# Patient Record
Sex: Female | Born: 1965 | Race: Black or African American | Hispanic: No | Marital: Single | Smoking: Current every day smoker
Health system: Southern US, Community
[De-identification: ages and names within clinical notes are randomized; demographics above are authoritative.]

## PROBLEM LIST (undated history)

## (undated) DIAGNOSIS — C50919 Malignant neoplasm of unspecified site of unspecified female breast: Secondary | ICD-10-CM

## (undated) HISTORY — DX: Malignant neoplasm of unspecified site of unspecified female breast: C50.919

## (undated) HISTORY — PX: ABDOMINAL HYSTERECTOMY: SHX81

---

## 2010-10-29 ENCOUNTER — Emergency Department (HOSPITAL_COMMUNITY): Payer: Self-pay

## 2010-10-29 ENCOUNTER — Emergency Department (HOSPITAL_COMMUNITY)
Admission: EM | Admit: 2010-10-29 | Discharge: 2010-10-30 | Disposition: A | Discharge status: Home / Self Care | Payer: Self-pay | Attending: Emergency Medicine | Admitting: Emergency Medicine

## 2010-10-29 ENCOUNTER — Inpatient Hospital Stay (INDEPENDENT_AMBULATORY_CARE_PROVIDER_SITE_OTHER)
Admission: RE | Admit: 2010-10-29 | Discharge: 2010-10-29 | Disposition: A | Discharge status: Home / Self Care | Payer: Self-pay | Source: Ambulatory Visit | Attending: Emergency Medicine | Admitting: Emergency Medicine

## 2010-10-29 DIAGNOSIS — R220 Localized swelling, mass and lump, head: Secondary | ICD-10-CM | POA: Insufficient documentation

## 2010-10-29 DIAGNOSIS — R259 Unspecified abnormal involuntary movements: Secondary | ICD-10-CM | POA: Insufficient documentation

## 2010-10-29 DIAGNOSIS — K047 Periapical abscess without sinus: Secondary | ICD-10-CM | POA: Insufficient documentation

## 2010-10-29 DIAGNOSIS — K089 Disorder of teeth and supporting structures, unspecified: Secondary | ICD-10-CM | POA: Insufficient documentation

## 2010-10-29 DIAGNOSIS — H9209 Otalgia, unspecified ear: Secondary | ICD-10-CM | POA: Insufficient documentation

## 2010-10-29 DIAGNOSIS — R221 Localized swelling, mass and lump, neck: Secondary | ICD-10-CM | POA: Insufficient documentation

## 2010-10-29 DIAGNOSIS — M542 Cervicalgia: Secondary | ICD-10-CM | POA: Insufficient documentation

## 2010-10-29 LAB — DIFFERENTIAL
Eosinophils Relative: 0 % (ref 0–5)
Lymphocytes Relative: 22 % (ref 12–46)
Lymphs Abs: 1.6 10*3/uL (ref 0.7–4.0)
Monocytes Absolute: 0.5 10*3/uL (ref 0.1–1.0)

## 2010-10-29 LAB — BASIC METABOLIC PANEL
BUN: 8 mg/dL (ref 6–23)
Calcium: 9.1 mg/dL (ref 8.4–10.5)
Creatinine, Ser: 0.64 mg/dL (ref 0.50–1.10)
GFR calc Af Amer: >90 mL/min (ref >90)
GFR calc non Af Amer: >90 mL/min (ref >90)

## 2010-10-29 LAB — CBC
HCT: 37.4 % (ref 36.0–46.0)
MCH: 35.8 pg — ABNORMAL HIGH (ref 26.0–34.0)
MCHC: 35.6 g/dL (ref 30.0–36.0)
MCV: 100.5 fL — ABNORMAL HIGH (ref 78.0–100.0)
RDW: 11.8 % (ref 11.5–15.5)

## 2010-10-29 MED ORDER — IOHEXOL 300 MG/ML  SOLN
50.0000 mL | Freq: Once | INTRAMUSCULAR | Status: AC | PRN
  Administered 2010-10-29: 50 mL via INTRAVENOUS

## 2012-10-24 ENCOUNTER — Emergency Department (HOSPITAL_COMMUNITY)
Admission: EM | Admit: 2012-10-24 | Discharge: 2012-10-24 | Disposition: A | Discharge status: Home / Self Care | Payer: Self-pay | Attending: Emergency Medicine | Admitting: Emergency Medicine

## 2012-10-24 ENCOUNTER — Encounter (HOSPITAL_COMMUNITY): Payer: Self-pay | Admitting: *Deleted

## 2012-10-24 DIAGNOSIS — F17200 Nicotine dependence, unspecified, uncomplicated: Secondary | ICD-10-CM | POA: Insufficient documentation

## 2012-10-24 DIAGNOSIS — K529 Noninfective gastroenteritis and colitis, unspecified: Secondary | ICD-10-CM

## 2012-10-24 DIAGNOSIS — Z7982 Long term (current) use of aspirin: Secondary | ICD-10-CM | POA: Insufficient documentation

## 2012-10-24 DIAGNOSIS — R5381 Other malaise: Secondary | ICD-10-CM | POA: Insufficient documentation

## 2012-10-24 DIAGNOSIS — R112 Nausea with vomiting, unspecified: Secondary | ICD-10-CM

## 2012-10-24 DIAGNOSIS — K5289 Other specified noninfective gastroenteritis and colitis: Secondary | ICD-10-CM | POA: Insufficient documentation

## 2012-10-24 DIAGNOSIS — Z3202 Encounter for pregnancy test, result negative: Secondary | ICD-10-CM | POA: Insufficient documentation

## 2012-10-24 LAB — URINALYSIS, ROUTINE W REFLEX MICROSCOPIC
Bilirubin Urine: NEGATIVE
Ketones, ur: NEGATIVE mg/dL
Nitrite: NEGATIVE
Urobilinogen, UA: 1 mg/dL (ref 0.0–1.0)
pH: 6.5 (ref 5.0–8.0)

## 2012-10-24 LAB — CBC WITH DIFFERENTIAL/PLATELET
Basophils Absolute: 0 10*3/uL (ref 0.0–0.1)
HCT: 38.5 % (ref 36.0–46.0)
Lymphocytes Relative: 19 % (ref 12–46)
Neutro Abs: 5.4 10*3/uL (ref 1.7–7.7)
Neutrophils Relative %: 74 % (ref 43–77)
Platelets: 347 10*3/uL (ref 150–400)
RDW: 12.9 % (ref 11.5–15.5)
WBC: 7.3 10*3/uL (ref 4.0–10.5)

## 2012-10-24 LAB — COMPREHENSIVE METABOLIC PANEL
Alkaline Phosphatase: 64 U/L (ref 39–117)
BUN: 12 mg/dL (ref 6–23)
GFR calc Af Amer: >90 mL/min (ref >90)
Glucose, Bld: 95 mg/dL (ref 70–99)
Potassium: 4.6 mEq/L (ref 3.5–5.1)
Total Protein: 7.1 g/dL (ref 6.0–8.3)

## 2012-10-24 LAB — POCT PREGNANCY, URINE: Preg Test, Ur: NEGATIVE

## 2012-10-24 LAB — URINE MICROSCOPIC-ADD ON

## 2012-10-24 MED ORDER — SODIUM CHLORIDE 0.9 % IV BOLUS (SEPSIS)
1000.0000 mL | Freq: Once | INTRAVENOUS | Status: AC
  Administered 2012-10-24: 1000 mL via INTRAVENOUS

## 2012-10-24 MED ORDER — ONDANSETRON 4 MG PO TBDP: 4.0000 mg | ORAL_TABLET | Freq: Three times a day (TID) | ORAL | Status: DC | PRN

## 2012-10-24 MED ORDER — ONDANSETRON HCL 4 MG/2ML IJ SOLN
4.0000 mg | Freq: Once | INTRAMUSCULAR | Status: AC
  Administered 2012-10-24: 4 mg via INTRAVENOUS
  Filled 2012-10-24: qty 2

## 2012-10-24 NOTE — ED Notes (Signed)
Pt reports n/v/d x 4 days and fever/chills. No acute distress noted at this time.

## 2012-10-24 NOTE — ED Notes (Signed)
Pt aware we need urine specimen. Specimen cup provided 

## 2012-10-24 NOTE — Discharge Instructions (Signed)
Viral Gastroenteritis °Viral gastroenteritis is also known as stomach flu. This condition affects the stomach and intestinal tract. It can cause sudden diarrhea and vomiting. The illness typically lasts 3 to 8 days. Most people develop an immune response that eventually gets rid of the virus. While this natural response develops, the virus can make you quite ill. °CAUSES  °Many different viruses can cause gastroenteritis, such as rotavirus or noroviruses. You can catch one of these viruses by consuming contaminated food or water. You may also catch a virus by sharing utensils or other personal items with an infected person or by touching a contaminated surface. °SYMPTOMS  °The most common symptoms are diarrhea and vomiting. These problems can cause a severe loss of body fluids (dehydration) and a body salt (electrolyte) imbalance. Other symptoms may include: °· Fever. °· Headache. °· Fatigue. °· Abdominal pain. °DIAGNOSIS  °Your caregiver can usually diagnose viral gastroenteritis based on your symptoms and a physical exam. A stool sample may also be taken to test for the presence of viruses or other infections. °TREATMENT  °This illness typically goes away on its own. Treatments are aimed at rehydration. The most serious cases of viral gastroenteritis involve vomiting so severely that you are not able to keep fluids down. In these cases, fluids must be given through an intravenous line (IV). °HOME CARE INSTRUCTIONS  °· Drink enough fluids to keep your urine clear or pale yellow. Drink small amounts of fluids frequently and increase the amounts as tolerated. °· Ask your caregiver for specific rehydration instructions. °· Avoid: °· Foods high in sugar. °· Alcohol. °· Carbonated drinks. °· Tobacco. °· Juice. °· Caffeine drinks. °· Extremely hot or cold fluids. °· Fatty, greasy foods. °· Too much intake of anything at one time. °· Dairy products until 24 to 48 hours after diarrhea stops. °· You may consume probiotics.  Probiotics are active cultures of beneficial bacteria. They may lessen the amount and number of diarrheal stools in adults. Probiotics can be found in yogurt with active cultures and in supplements. °· Wash your hands well to avoid spreading the virus. °· Only take over-the-counter or prescription medicines for pain, discomfort, or fever as directed by your caregiver. Do not give aspirin to children. Antidiarrheal medicines are not recommended. °· Ask your caregiver if you should continue to take your regular prescribed and over-the-counter medicines. °· Keep all follow-up appointments as directed by your caregiver. °SEEK IMMEDIATE MEDICAL CARE IF:  °· You are unable to keep fluids down. °· You do not urinate at least once every 6 to 8 hours. °· You develop shortness of breath. °· You notice blood in your stool or vomit. This may look like coffee grounds. °· You have abdominal pain that increases or is concentrated in one small area (localized). °· You have persistent vomiting or diarrhea. °· You have a fever. °· The patient is a child younger than 3 months, and he or she has a fever. °· The patient is a child older than 3 months, and he or she has a fever and persistent symptoms. °· The patient is a child older than 3 months, and he or she has a fever and symptoms suddenly get worse. °· The patient is a baby, and he or she has no tears when crying. °MAKE SURE YOU:  °· Understand these instructions. °· Will watch your condition. °· Will get help right away if you are not doing well or get worse. °Document Released: 01/06/2005 Document Revised: 03/31/2011 Document Reviewed: 10/23/2010 °  ExitCare Patient Information 2014 [place], [place].  RESOURCE GUIDE  Chronic Pain Problems: Contact [facility]  [phone] Patients need to be referred by their primary care doctor.  Insufficient Money for Medicine: Contact United Way:  call [phone]  No Primary Care Doctor: - Call Health Connect   [phone] - can help you locate a primary care doctor that  accepts your insurance, provides certain services, etc. - Physician Referral Service- [phone]  Agencies that provide inexpensive medical care: - [name]  [phone] - [name]  [phone] - [facility]  [phone] - Women's Clinic  [phone] - Planned Parenthood  [phone] [facility]  [phone]  Medicaid-accepting [facility] Providers: - [facility]- [address]  [phone], Mon-Fri 9am-7pm, Sat 9am-1pm - [facility]- [address] [place], Suite [place]  [phone] - [facility]- [address] [place]  [phone] [facility]- [address]  [phone] - Renaye Rakers- [address] [place], [address], [phone]  Only accepts [place] patients after they have their name  applied to their card  Self Pay (no insurance) in [place]: - Sickle Cell Patients - [facility]  [address] [place], [phone] - [facility]- [address] [place]  [phone]       [name]- [address]       -     [facility]- see information above (Speak to [employer] if you do not have insurance)       -  [facility]- [address],  [phone]       -  [facility]- [address], [phone]       -  Dr Julio Sicks-  [address], [place], [phone]       -  [facility] - [address] [phone]       -  [facility]- [address], [phone], also [address], [phone]       -     [facility]- [address] [place], [phone], 1st & 3rd Saturday         every month, 10am-1pm  -     [facility]   [address] [place], [place].   Phone:  [phone], Fax:  [phone]. Hours  of Operation:  9 am - 6 pm, M-F.  -     [facility]   [address], [place]   Phone: [phone], Fax: [phone]. Hours of Operation:  8:30 am - 5:30 pm, M-F.  [facility] [address] [place], [place] [address] [phone]  [facility] [address] [place], [place] [address] [phone]  1) Find a Doctor and Pay Out of Pocket Although you won't have to find out who is covered by your insurance plan, it is a good idea to ask around and get recommendations. You will then need to call the office and see if the doctor you have chosen will accept you as a new patient and what types of options they offer for patients who are self-pay. Some doctors offer discounts or will set up payment plans for  their patients who do not have insurance, but you will need to ask so you aren't surprised when you get to your appointment.  2) Contact Your Local Health Department Not all health departments have doctors that can see patients for sick visits, but many do, so it is worth a call to see if yours does. If you don't know where your local health department is, you can check in your phone book. The CDC also has a tool to help you locate your state's health department, and many state websites also have listings of all of their local health departments.  3) Find a Walk-in Clinic If your illness is not likely to be very severe or complicated, you may want to try a walk in clinic. These are popping up all over the country in pharmacies, drugstores, and shopping centers. They're usually staffed by nurse practitioners or physician assistants that have been trained to treat common illnesses and complaints. They're usually fairly quick and inexpensive. However, if you have serious medical issues or chronic medical problems, these are probably not your best option  STD Testing - [facility] [place], STD  Clinic, [address] [place], phone [phone] or [phone].  Monday - Friday, call for an appointment. [facility] [employer], STD Clinic, [place] [address], [place], phone [phone] or [phone].  Monday - Friday, call for an appointment.  Abuse/Neglect: [facility] Child Abuse Hotline (615)[phone] [facility] Child Abuse Hotline 860-131-4720 (After Hours)  Emergency Shelter:  [facility] [phone]  Maternity Homes: - Room at the [place] [facility] [phone] - [facility] [phone]  MRSA Hotline #:   539-629-2669  Dental Assistance If unable to pay or uninsured, contact:  [facility]. to become qualified for the adult dental clinic.  Patients with Medicaid: [facility] [phone] [address] Qu[address], [phone] [address], [phone]  If unable to pay, or uninsured, contact [facility] [phone] in [place], [phone] in [facility]) to become qualified for the adult dental clinic  [facility] [address] [place], [place] [address] [phone] [email or web address]  Other Proofreader Services: - [facility]- [address] [place], [place], [place], [address], [phone], Ext. [phone], 2nd and 4th Thursday of the month at 6:30am.  10 clients each day by appointment, can sometimes see walk-in patients if someone does not show for an appointment. [facility]- [address] [place], [place], [address], [phone] - [facility] [address] [place], [place], [address], [phone] - [facility]- [phone] [facility] Health Department- [phone] [facility] Health [phone]       Behavioral Health Resources in the [facility]  Intensive Outpatient Programs: [facility] [place] [facility]      [address] [place], [place] [phone] Both a day and evening program       [facility] Out[facility]     [address]        [place], [place] [address] [phone]         ADS: [facility] [address] [place] [phone]  [facility] Mental Health ACCESS LINE: [phone] or [phone] [address] [place], [place] [address] [email or web address]   Substance Abuse Resources: - [facility] Drug [facility]  ([phone] - [facility] [phone] -  [facility] [place] [phone] Floydene Flock [phone] - Residential & Outpatient Substance Abuse Program  [phone]  Psychological Services: [facility]  [phone] Services  [phone] - [facility], [phone] [place]. [address] [place], ACCESS LINE: [phone] or [phone], [email or web address]  Mobile Crisis Teams:                                        Therapeutic Alternatives         Mobile Crisis Care Unit [phone]             [facility] [address] Ginette Otto [phone]                                         Interventionist [address] [place] [address] [place] [phone]  Self-Help/Support Groups: Mental Health Assoc[facility] of [employer] of support groups [phone] (call for more info)  Narcotics Anonymous (NA) Caring Services [address] [place] - 2 meetings at this location  Residential Treatment Programs:  [facility]      [address]        [place]       [phone]         [facility] [address] [place] [address] [place], [place]  [address] [phone]  [facility]  [address] [place], [place] [address] [phone] Admissions: 8am-3pm M-F  [facility]     [address]          [place], [place] [address]       [phone]         [facility] [address] [place], [place] [phone]  [facility] [address] [place], [place] [phone]  [facility] - Intensive Outpatient      [address]     [place], [place]       [phone]         [facility] ([facility]     [address] [place], [place] [phone] or [phone]  Residential Treatment Services (RTS), Medicaid [address] [place] [address] [place], [place] [phone]  [facility] [address]                                               [address] [place] [phone]  [facility] Resources: [phone]               General Therapy                                                Angie Fava, PhD        [address] [place], [place] [address]  [phone]   Insurance  [facility]al   [address] [place], [place] [address] [phone]  [facility] [address] [employer], [place] [address] [phone] Insurance/Medicaid/sponsorship through [facility] and Families                                              [address]                                        [place], [place] [address]    Therapy/tele-psych/case         [phone]          [facility] [place] [facility] [address] [place], [place]  [address]  Adolescent/group home/case management [phone]                                           Creola Corn PhD       General therapy       Insurance   [phone]         Dr. Lolly Mustache, Insurance, M-F [phone]  [facility]  [employer] [facility] Dept. [address]                 [address]         [address] [place] [address]  Blondell Reveal Phone:  [phone]                                   Phone:  [phone]                   Phone:  [phone]  [facility], [phone] - [facility]- [phone]       -     [facility] in [place], [address],             [phone], Insurance  [place] Child Abuse Hotline 646-187-8734 or 939-509-3117 (After Hours)

## 2012-10-24 NOTE — ED Provider Notes (Signed)
CSN: [id number]     Arrival date & time 10/24/12  1453 History   First MD Initiated Contact with Patient 10/24/12 1609     Chief Complaint  Patient presents with  . Emesis  . Diarrhea   (Consider location/radiation/quality/duration/timing/severity/associated sxs/prior Treatment) Patient is a 47 y.o. female presenting with vomiting.  Emesis Duration:  4 days Timing:  Intermittent Number of daily episodes:  3 Quality:  Stomach contents Progression:  Unchanged Chronicity:  New Recent urination:  Decreased Relieved by:  Nothing Worsened by:  Nothing tried Ineffective treatments:  None tried Associated symptoms: diarrhea   Associated symptoms: no abdominal pain, no chills, no cough, no fever, no headaches and no myalgias   Risk factors: no alcohol use, no prior abdominal surgery and no sick contacts     History reviewed. No pertinent past medical history. Past Surgical History  Procedure Laterality Date  . Abdominal hysterectomy     History reviewed. No pertinent family history. History  Substance Use Topics  . Smoking status: Current Every Day Smoker    Types: Cigarettes  . Smokeless tobacco: Not on file  . Alcohol Use: Yes     Comment: occ beer   OB History   Grav Para Term Preterm Abortions TAB SAB Ect Mult Living                 Review of Systems  Constitutional: Negative for chills.  HENT: Negative for congestion and rhinorrhea.   Respiratory: Negative for cough, chest tightness and shortness of breath.   Cardiovascular: Negative for chest pain.  Gastrointestinal: Positive for nausea, vomiting and diarrhea. Negative for abdominal pain.  Genitourinary: Negative for dysuria and difficulty urinating.  Musculoskeletal: Negative for myalgias.  Skin: Negative for color change, pallor, rash and wound.  Neurological: Positive for weakness. Negative for dizziness, seizures, speech difficulty and headaches.  All other systems reviewed and are negative.    Allergies   Review of patient's allergies indicates no known allergies.  Home Medications   Current Outpatient Rx  Name  Route  Sig  Dispense  Refill  . Aspirin-Salicylamide-Caffeine (BC HEADACHE POWDER PO)   Oral   Take 1 Package by mouth daily as needed. For pain         . ondansetron (ZOFRAN ODT) 4 MG disintegrating tablet   Oral   Take 1 tablet (4 mg total) by mouth every 8 (eight) hours as needed for nausea.   10 tablet   0    BP 142/78  Pulse 86  Temp(Src) 98.3 F (36.8 C) (Oral)  Resp 17  Ht 5\' 2"  (1.575 m)  Wt 93 lb 4.8 oz (42.321 kg)  BMI 17.06 kg/m2  SpO2 100% Physical Exam  Nursing note and vitals reviewed. Constitutional: She is oriented to person, place, and time. She appears well-developed and well-nourished. No distress.  Pleasant female, appears older than her stated age, in NAD  HENT:  Head: Normocephalic and atraumatic.  Mouth/Throat: Oropharynx is clear and moist. No oropharyngeal exudate.  Dry mucous membranes  Eyes: Conjunctivae and EOM are normal. Pupils are equal, round, and reactive to light.  Neck: Normal range of motion. Neck supple.  Cardiovascular: Normal rate, regular rhythm and normal heart sounds.  Exam reveals no gallop and no friction rub.   No murmur heard. Pulmonary/Chest: Effort normal and breath sounds normal. No respiratory distress. She has no wheezes. She has no rales. She exhibits no tenderness.  Abdominal: Soft. She exhibits no distension. There is no tenderness.  Musculoskeletal:  Normal range of motion. She exhibits no edema and no tenderness.  Lymphadenopathy:    She has no cervical adenopathy.  Neurological: She is alert and oriented to person, place, and time.  Skin: Skin is warm and dry. No rash noted. She is not diaphoretic.  3 second cap refill  Psychiatric: She has a normal mood and affect. Her behavior is normal. Judgment and thought content normal.    ED Course  Procedures (including critical care time) Labs Review Labs  Reviewed  URINALYSIS, ROUTINE W REFLEX MICROSCOPIC - Abnormal; Notable for the following:    APPearance CLOUDY (*)    Hgb urine dipstick TRACE (*)    All other components within normal limits  COMPREHENSIVE METABOLIC PANEL - Abnormal; Notable for the following:    Albumin 3.4 (*)    GFR calc non Af Amer 82 (*)    All other components within normal limits  CBC WITH DIFFERENTIAL - Abnormal; Notable for the following:    RBC 3.78 (*)    MCV 101.9 (*)    MCH 36.8 (*)    MCHC 36.1 (*)    All other components within normal limits  URINE MICROSCOPIC-ADD ON - Abnormal; Notable for the following:    Squamous Epithelial / LPF FEW (*)    Bacteria, UA FEW (*)    All other components within normal limits  LIPASE, BLOOD  POCT PREGNANCY, URINE   Imaging Review No results found.  MDM   1. Gastroenteritis   2. Nausea and vomiting     Patient is a 47 year old female with no significant past medical history who presents with 4 days of nausea, vomiting, diarrhea. She describes watery bowel movements, twice a day, as well as intermittent vomiting, with more dry heaving, worse in the mornings. She states that she has had decreased intake over that time and just generally feels and dehydrated. She denies, chest pain, shortness of breath, hematemesis, blood in her stool, dysuria.  On arrival, the patient is afebrile and hemodynamically stable. She does look mildly dehydrated with dry mucous membranes and 3 seconds capillary refill. She is otherwise well-appearing with no abdominal pain and no abdominal tenderness. Doubt appendicitis, obstruction, nephrolithiasis, gastritis based on history and exam. More likely is viral illness with mild dehydration. Will rehydrate and evaluate with abdominal labs, urinalysis.  Following hydration, patient feels significantly improved. No pain. No nausea or vomiting during her emergency department stay. Labs show no leukocytosis, no anemia, no elevated lipase. Normal  urine without signs of urinary tract infection. Urine pregnancy negative. Presentation is consistent with gastroenteritis. Will treat with anti-motives and Zofran when necessary. Abdominal return precautions discussed with the patient. Patient stable for discharge her PCP followup.  Discussed with the patient return precautions and need for follow up with PCP. Patient voiced understanding. Stable for d/c. This patient was discussed with my attending, Dr. Redgie Grayer.  Dorna Leitz, MD 10/24/12 Rickey Primus

## 2012-10-26 NOTE — ED Provider Notes (Signed)
I saw and evaluated the patient, reviewed the resident's note and I agree with the findings and plan.  Benign abd, labs neg, sxs improved in ER.  D/c with imodium and zofran.  ER precautions given.  Darlys Gales, MD 10/26/12 2024

## 2016-05-27 ENCOUNTER — Emergency Department (HOSPITAL_COMMUNITY): Payer: Self-pay

## 2016-05-27 ENCOUNTER — Encounter (HOSPITAL_COMMUNITY): Payer: Self-pay | Admitting: Emergency Medicine

## 2016-05-27 ENCOUNTER — Emergency Department (HOSPITAL_COMMUNITY)
Admission: EM | Admit: 2016-05-27 | Discharge: 2016-05-27 | Disposition: A | Discharge status: Home / Self Care | Payer: Self-pay | Attending: Emergency Medicine | Admitting: Emergency Medicine

## 2016-05-27 DIAGNOSIS — W230XXA Caught, crushed, jammed, or pinched between moving objects, initial encounter: Secondary | ICD-10-CM | POA: Insufficient documentation

## 2016-05-27 DIAGNOSIS — Y939 Activity, unspecified: Secondary | ICD-10-CM | POA: Insufficient documentation

## 2016-05-27 DIAGNOSIS — F17210 Nicotine dependence, cigarettes, uncomplicated: Secondary | ICD-10-CM | POA: Insufficient documentation

## 2016-05-27 DIAGNOSIS — S61211A Laceration without foreign body of left index finger without damage to nail, initial encounter: Secondary | ICD-10-CM | POA: Insufficient documentation

## 2016-05-27 DIAGNOSIS — Z7982 Long term (current) use of aspirin: Secondary | ICD-10-CM | POA: Insufficient documentation

## 2016-05-27 DIAGNOSIS — Z23 Encounter for immunization: Secondary | ICD-10-CM | POA: Insufficient documentation

## 2016-05-27 DIAGNOSIS — Y929 Unspecified place or not applicable: Secondary | ICD-10-CM | POA: Insufficient documentation

## 2016-05-27 DIAGNOSIS — Y999 Unspecified external cause status: Secondary | ICD-10-CM | POA: Insufficient documentation

## 2016-05-27 MED ORDER — IBUPROFEN 400 MG PO TABS
600.0000 mg | ORAL_TABLET | Freq: Once | ORAL | Status: AC
  Administered 2016-05-27: 600 mg via ORAL
  Filled 2016-05-27: qty 1

## 2016-05-27 MED ORDER — OXYCODONE-ACETAMINOPHEN 5-325 MG PO TABS
1.0000 | ORAL_TABLET | Freq: Once | ORAL | Status: AC
  Administered 2016-05-27: 1 via ORAL
  Filled 2016-05-27: qty 1

## 2016-05-27 MED ORDER — TETANUS-DIPHTH-ACELL PERTUSSIS 5-2.5-18.5 LF-MCG/0.5 IM SUSP
0.5000 mL | Freq: Once | INTRAMUSCULAR | Status: AC
  Administered 2016-05-27: 0.5 mL via INTRAMUSCULAR
  Filled 2016-05-27: qty 0.5

## 2016-05-27 NOTE — ED Notes (Signed)
Patient verbalized understanding of discharge instructions and denies any further needs or questions at this time. VS stable. Patient ambulatory with steady gait. RN escorted to ED entrance in wheelchair.   

## 2016-05-27 NOTE — ED Triage Notes (Signed)
Pt to ER for left pointer finger laceration that occurred two days ago when patient slammed finger in screen door. Last tetanus 9 years ago. A/o x4. NAD. Denies pain.

## 2016-05-27 NOTE — ED Provider Notes (Signed)
[facility]-EMERGENCY DEPT Provider Note   CSN: [id number] Arrival date & time: 05/27/16  1521  By signing my name below, I, [name], attest that this documentation has been prepared under the direction and in the presence of Raeford RazorKohut, Asher Babilonia, MD. Electronically Signed: [name], Neurosurgeoncribe. 05/27/16. 4:43 PM.  History   Chief Complaint Chief Complaint  Patient presents with  . Finger Injury   The history is provided by the patient. No language interpreter was used.     HPI Comments: Elizabeth Mason is a 51 y.o. female who presents to the Emergency Department complaining of a laceration to the left index finger that occurred 2 days ago after slamming her finger in a screen door. She has associated throbbing pain and paresthesia to the affected area. She has cleaned the area with hydrogen peroxide and applied Neosporin. She denies active bleeding. She is right hand dominant.   History reviewed. No pertinent past medical history.  There are no active problems to display for this patient.   Past Surgical History:  Procedure Laterality Date  . ABDOMINAL HYSTERECTOMY      OB History    No data available       Home Medications    Prior to Admission medications   Medication Sig Start Date End Date Taking? Authorizing Provider  Aspirin-Salicylamide-Caffeine (BC HEADACHE POWDER PO) Take 1 Package by mouth daily as needed. For pain    [provider]  ondansetron (ZOFRAN ODT) 4 MG disintegrating tablet Take 1 tablet (4 mg total) by mouth every 8 (eight) hours as needed for nausea. 10/24/12   Dorna LeitzNickle, Alex, MD    Family History No family history on file.  Social History Social History  Substance Use Topics  . Smoking status: Current Every Day Smoker    Types: Cigarettes  . Smokeless tobacco: Never Used  . Alcohol use Yes     Comment: occ beer     Allergies   Patient has no known allergies.   Review of Systems Review of Systems  Skin: Positive for wound.    Neurological: Negative for weakness.       +paresthesia      Physical Exam Updated Vital Signs BP (!) 140/93   Pulse 82   Temp 98.3 F (36.8 C) (Oral)   Resp 16   Ht 5\' 2"  (1.575 m)   Wt 95 lb (43.1 kg)   SpO2 99%   BMI 17.38 kg/m   Physical Exam  Constitutional: She is oriented to person, place, and time. She appears well-developed and well-nourished.  HENT:  Head: Normocephalic.  Eyes: EOM are normal.  Neck: Normal range of motion.  Pulmonary/Chest: Effort normal.  Musculoskeletal: Normal range of motion.  Neurological: She is alert and oriented to person, place, and time. A sensory deficit is present.  Skin: Capillary refill takes less than 2 seconds. Laceration noted.  1.5 cm flap laceration to the palmar aspect of distal left index finger. Decreased sensation to light touch over radial aspect of the finger tip. Good cap refill. Can flex against resistance.   Psychiatric: She has a normal mood and affect.  Nursing note and vitals reviewed.    ED Treatments / Results  DIAGNOSTIC STUDIES: Oxygen Saturation is 99% on RA, normal by my interpretation.    COORDINATION OF CARE: 4:40 PM Discussed treatment plan with pt at bedside and pt agreed to plan.   Labs (all labs ordered are listed, but only abnormal results are displayed) Labs Reviewed - No data to display  EKG  EKG Interpretation None       Radiology No results found.  Procedures Procedures (including critical care time)  Medications Ordered in ED Medications - No data to display   Initial Impression / Assessment and Plan / ED Course  I have reviewed the triage vital signs and the nursing notes.  Pertinent labs & imaging results that were available during my care of the patient were reviewed by me and considered in my medical decision making (see chart for details).     50yF with finger laceration. Delayed presentation and I think the risk of closure aat this point outweighs benefit. NVI.  Continued wound care and return precautions discussed.   Final Clinical Impressions(s) / ED Diagnoses   Final diagnoses:  Laceration of left index finger without damage to nail, foreign body presence unspecified, initial encounter    New Prescriptions New Prescriptions   No medications on file   I personally preformed the services scribed in my presence. The recorded information has been reviewed is accurate. Raeford Razor, MD.     Raeford Razor, MD 06/02/16 [phone]

## 2017-08-31 ENCOUNTER — Other Ambulatory Visit: Payer: Self-pay

## 2017-08-31 ENCOUNTER — Emergency Department (HOSPITAL_COMMUNITY): Payer: Self-pay

## 2017-08-31 ENCOUNTER — Emergency Department (HOSPITAL_COMMUNITY)
Admission: EM | Admit: 2017-08-31 | Discharge: 2017-08-31 | Disposition: A | Discharge status: Home / Self Care | Payer: Self-pay | Attending: Emergency Medicine | Admitting: Emergency Medicine

## 2017-08-31 ENCOUNTER — Encounter (HOSPITAL_COMMUNITY): Payer: Self-pay | Admitting: Emergency Medicine

## 2017-08-31 DIAGNOSIS — F17210 Nicotine dependence, cigarettes, uncomplicated: Secondary | ICD-10-CM | POA: Insufficient documentation

## 2017-08-31 DIAGNOSIS — Z79899 Other long term (current) drug therapy: Secondary | ICD-10-CM | POA: Insufficient documentation

## 2017-08-31 DIAGNOSIS — K047 Periapical abscess without sinus: Secondary | ICD-10-CM

## 2017-08-31 DIAGNOSIS — R12 Heartburn: Secondary | ICD-10-CM

## 2017-08-31 DIAGNOSIS — R2232 Localized swelling, mass and lump, left upper limb: Secondary | ICD-10-CM

## 2017-08-31 DIAGNOSIS — R0789 Other chest pain: Secondary | ICD-10-CM

## 2017-08-31 LAB — I-STAT TROPONIN, ED
Troponin i, poc: 0 ng/mL (ref 0.00–0.08)
Troponin i, poc: 0 ng/mL (ref 0.00–0.08)

## 2017-08-31 LAB — BASIC METABOLIC PANEL
ANION GAP: 12 (ref 5–15)
BUN: 9 mg/dL (ref 6–20)
CALCIUM: 8.5 mg/dL — AB (ref 8.9–10.3)
CO2: 24 mmol/L (ref 22–32)
Chloride: 106 mmol/L (ref 98–111)
Creatinine, Ser: 0.54 mg/dL (ref 0.44–1.00)
GFR calc Af Amer: >60 mL/min (ref >60)
GFR calc non Af Amer: >60 mL/min (ref >60)
GLUCOSE: 86 mg/dL (ref 70–99)
Potassium: 3.8 mmol/L (ref 3.5–5.1)
Sodium: 142 mmol/L (ref 135–145)

## 2017-08-31 LAB — ETHANOL: Alcohol, Ethyl (B): 100 mg/dL — ABNORMAL HIGH (ref <10)

## 2017-08-31 LAB — CBC
HEMATOCRIT: 38 % (ref 36.0–46.0)
HEMOGLOBIN: 13 g/dL (ref 12.0–15.0)
MCH: 35.9 pg — ABNORMAL HIGH (ref 26.0–34.0)
MCHC: 34.2 g/dL (ref 30.0–36.0)
MCV: 105 fL — ABNORMAL HIGH (ref 78.0–100.0)
Platelets: 345 10*3/uL (ref 150–400)
RBC: 3.62 MIL/uL — ABNORMAL LOW (ref 3.87–5.11)
RDW: 12.7 % (ref 11.5–15.5)
WBC: 7.5 10*3/uL (ref 4.0–10.5)

## 2017-08-31 LAB — RAPID URINE DRUG SCREEN, HOSP PERFORMED
AMPHETAMINES: NOT DETECTED
BARBITURATES: NOT DETECTED
BENZODIAZEPINES: NOT DETECTED
COCAINE: NOT DETECTED
Opiates: NOT DETECTED
TETRAHYDROCANNABINOL: POSITIVE — AB

## 2017-08-31 MED ORDER — AMOXICILLIN-POT CLAVULANATE 875-125 MG PO TABS: 1.0000 | ORAL_TABLET | Freq: Two times a day (BID) | ORAL | 0 refills | Status: AC

## 2017-08-31 MED ORDER — FAMOTIDINE 20 MG PO TABS
20.0000 mg | ORAL_TABLET | Freq: Once | ORAL | Status: AC
  Administered 2017-08-31: 20 mg via ORAL
  Filled 2017-08-31: qty 1

## 2017-08-31 MED ORDER — PANTOPRAZOLE SODIUM 20 MG PO TBEC: 20.0000 mg | DELAYED_RELEASE_TABLET | Freq: Two times a day (BID) | ORAL | 0 refills | Status: DC

## 2017-08-31 NOTE — Discharge Instructions (Signed)
Take antibiotics as prescribed for dental pain. Take protonix twice daily before means for heart burn. Follow up with dentist for you dental infection and cavities. You will likely need tooth extraction. I also recommend following up with a primary care provider for general care and also for yearly female exam and monitoring of cyst in left arm pit. You will need a mammogram for screening as well as eventual screening colonoscopy. I encourage you to stop smoking as well.

## 2017-08-31 NOTE — ED Notes (Signed)
Pt aware we need a urine specimen. 

## 2017-08-31 NOTE — ED Triage Notes (Signed)
[facility] reports the pt's husband called 9-1-1 because at 0445 she woke him up complaining of chest pain. EMS reports the pt is intoxicated and smoked marijuana. EMS reports it is tough getting anything out of the pt. EMS started IV as noted, 12 lead unremarkable, and 324 baby ASA.   Pt states she is having chest pain that woke her up. Pt unable to describe the pain. Pt also complains of tooth pain on the right upper jaw. Pt also complains of an abscess under her left arm. Pt admits to drinking 6 beers and smoking weed. Pt denies any other recreational drugs

## 2017-08-31 NOTE — ED Provider Notes (Signed)
Medical screening examination/treatment/procedure(s) were conducted as a shared visit with non-physician practitioner(s) and myself.  I personally evaluated the patient during the encounter  Please see my separate respective documentation pertaining to this patient encounter  The patient is a 52 year old female, presents with a complaint of chest pain, there is also been some oral swelling, at this time the patient denies any symptoms at all stating that she is feeling better, she was clinically intoxicated as well as metabolically with a blood alcohol of over 100.  She has clear heart and lung sounds, soft abdomen, she has multiple dental caries with minimal gingival swelling but no facial asymmetry trismus or torticollis.  Second troponin, anticipate discharge on antibiotics if second trop neg   EKG Interpretation  Date/Time:  Monday August 31 2017 05:42:22 EDT Ventricular Rate:  85 PR Interval:    QRS Duration: 88 QT Interval:  361 QTC Calculation: 430 R Axis:   41 Text Interpretation:  Sinus rhythm Anterior infarct, old No previous ECGs available Confirmed by Zadie RhineWickline, Donald ([id number]) on 08/31/2017 6:30:26 AM Also confirmed by Zadie RhineWickline, Donald ([id number]), editor Barbette Hairassel, Kerry [phone]([id number])  on 08/31/2017 7:04:52 AM      Final diagnoses:  Dental infection  Atypical chest pain  Heart burn  Axillary mass, left      Elizabeth Mason, Elizabeth Luhman, MD 09/03/17 1026

## 2017-08-31 NOTE — ED Provider Notes (Signed)
[facility]   CSN: [id number] Arrival date & time: 08/31/17  0539     History   Chief Complaint Chief Complaint  Patient presents with  . Chest Pain  . Dental Pain  . Abscess    HPI Elizabeth Mason is a 52 y.o. female past medical history of alcohol abuse and tobacco use who presented to the ED today complaining of chest pain. Patient reports prior to going to bed last night she drank 6-7 beers and smoked marijuana. She was awakened from sleep around 2 AM with sharp central chest pain that lasted about 30 minutes. She denied any associated breath. Initially this felt like heartburn but then eventually became kind of sharp. She told her husband that she was feeling medicine he encouraged her to come to the emergency department. She denies any radiating pain, paresthesias, diaphoresis, nausea, vomiting, abdominal pain. She also reports a sensation of feeling like her right side of her face is swollen and painful. She has been having dental pain in her right upper molar for several weeks now. She states that her tooth broke off while eating some candy. She denies any dysphagia, odynophagia, difficulty breathing or tongue swelling. She has not tried taking anything for her symptoms and has not seen a dentist. Lastly, she notes a lump in her left axilla. She states it is nonpainful and has been there for several months to possibly a year. She denies any abnormal weight loss, night sweats. She has no family history of breast cancer, ovarian cancer. She does not believe she is ever had a mammogram before.  Currently her chest pain has resolved.    HPI  History reviewed. No pertinent past medical history.  There are no active problems to display for this patient.   Past Surgical History:  Procedure Laterality Date  . ABDOMINAL HYSTERECTOMY       OB History   None      Home Medications    Prior to Admission medications     Medication Sig Start Date End Date Taking? Authorizing Provider  Aspirin-Salicylamide-Caffeine (BC HEADACHE POWDER PO) Take 1 Package by mouth daily as needed. For pain    [provider]  ondansetron (ZOFRAN ODT) 4 MG disintegrating tablet Take 1 tablet (4 mg total) by mouth every 8 (eight) hours as needed for nausea. Patient not taking: Reported on 08/31/2017 10/24/12   Dorna LeitzNickle, Alex, MD    Family History No family history on file.  Social History Social History   Tobacco Use  . Smoking status: Current Every Day Smoker    Types: Cigarettes  . Smokeless tobacco: Never Used  Substance Use Topics  . Alcohol use: Yes    Comment: occ beer  . Drug use: Yes    Frequency: 3.0 times per week    Types: Marijuana     Allergies   Patient has no known allergies.   Review of Systems Review of Systems  All other systems reviewed and are negative.    Physical Exam Updated Vital Signs BP (!) 150/85   Pulse 74   Temp 98.2 F (36.8 C) (Oral)   Resp 18   Ht 5\' 2"  (1.575 m)   Wt 43.1 kg   LMP  (Exact Date)   SpO2 95%   BMI 17.38 kg/m   Physical Exam  Constitutional: She is oriented to person, place, and time. No distress.  Adult female, intoxicated  HENT:  Head: Normocephalic and atraumatic.  Mouth/Throat: No  oropharyngeal exudate.  Very poor dentition with significant dental caries. No obvious abscess or fluctuation. No purulence or induration. No sublingual or sub ungual swelling. No evidence of deep space abscess.   Eyes: Pupils are equal, round, and reactive to light. Conjunctivae and EOM are normal. Right eye exhibits no discharge. Left eye exhibits no discharge. No scleral icterus.  Cardiovascular: Normal rate, regular rhythm, normal heart sounds and intact distal pulses. Exam reveals no gallop and no friction rub.  No murmur heard. Pulmonary/Chest: Effort normal and breath sounds normal. No respiratory distress. She has no wheezes. She has no rales. She exhibits  no tenderness.  Abdominal: Soft. She exhibits no distension. There is no tenderness. There is no guarding.  Genitourinary:  Genitourinary Comments: Small 1cm mobile and rubbery mass in left axillae, non-tender. No lymphadenopathy. No palpable breast mass, nipple inversion or skin changes.   Musculoskeletal: Normal range of motion. She exhibits no edema.  Neurological: She is alert and oriented to person, place, and time.  Skin: Skin is warm and dry. No rash noted. She is not diaphoretic. No erythema. No pallor.  Psychiatric: She has a normal mood and affect. Her behavior is normal.  Nursing note and vitals reviewed.    ED Treatments / Results  Labs (all labs ordered are listed, but only abnormal results are displayed) Labs Reviewed  BASIC METABOLIC PANEL - Abnormal; Notable for the following components:      Result Value   Calcium 8.5 (*)    All other components within normal limits  CBC - Abnormal; Notable for the following components:   RBC 3.62 (*)    MCV 105.0 (*)    MCH 35.9 (*)    All other components within normal limits  ETHANOL - Abnormal; Notable for the following components:   Alcohol, Ethyl (B) 100 (*)    All other components within normal limits  RAPID URINE DRUG SCREEN, HOSP PERFORMED  I-STAT TROPONIN, ED    EKG EKG Interpretation  Date/Time:  Monday August 31 2017 05:42:22 EDT Ventricular Rate:  85 PR Interval:    QRS Duration: 88 QT Interval:  361 QTC Calculation: 430 R Axis:   41 Text Interpretation:  Sinus rhythm Anterior infarct, old No previous ECGs available Confirmed by Zadie RhineWickline, Donald ([id number]) on 08/31/2017 6:30:26 AM Also confirmed by Zadie RhineWickline, Donald ([id number]), editor Barbette Hairassel, Kerry [phone])  on 08/31/2017 7:04:52 AM   Radiology Dg Chest 2 View  Result Date: 08/31/2017 CLINICAL DATA:  Mid chest pain. EXAM: CHEST - 2 VIEW COMPARISON:  None. FINDINGS: Hyperinflation consistent with emphysema. Normal heart size and pulmonary vascularity. No focal  airspace disease or consolidation in the lungs. No blunting of costophrenic angles. No pneumothorax. Mediastinal contours appear intact. IMPRESSION: No active cardiopulmonary disease. Electronically Signed   By: Burman NievesWilliam  Stevens M.D.   On: 08/31/2017 05:58    Procedures Procedures (including critical care time)  Medications Ordered in ED Medications  famotidine (PEPCID) tablet 20 mg (has no administration in time range)     Initial Impression / Assessment and Plan / ED Course  I have reviewed the triage vital signs and the nursing notes.  Pertinent labs & imaging results that were available during my care of the patient were reviewed by me and considered in my medical decision making (see chart for details).     52 y.o F with pmhx of tobacco use presented to the ED today with multiple complaints including chest pain, l;eft sided facial swelling and left axillary mass. She is  also acutely intoxicated, last drank 8 beers yesterday with elevated ethanol level. Chest pain was located substernal and lastd 20 minutes. Currently resolved. States this feels like heart burn. Her heart score is 2, overall low risk of ACS. EKG unremarkable, initial troponin 0. Will obtain delta troponin to r/o ACS. She is PERC negative.   Additionally, she complains of left sided facial swelling. No obvious swelling on exam. No facial droop. Crianial nerves intact. She has very poor dentition on exam with obvious cavities. No obvious abscess but likely has dental infection. Will dc with augmentin BID for 5 days with dental follow up.  Mass in left axilla has been present for several months/ ?years. It is soft, rubbery and nontender. NO palpable breast mass or family hx breast Ca. Clinically I suspect this is a lipoma but she will need an outpatient mammogram. Pt expresses understanding of this.   Delta troponin resulted and is wnl. She is feeling well now. I gave her pepcid which improved her heart burn. Will dc her  with protonix BIDAC and GERD precautions.   Patient was discussed with and seen by Dr. Hyacinth Meeker who agrees with the treatment plan.   Final Clinical Impressions(s) / ED Diagnoses   Final diagnoses:  None    ED Discharge Orders    None       Deaglan Lile, Lester Kinsman, PA-C 08/31/17 1108    Eber Hong, MD 09/03/17 1026

## 2019-05-28 ENCOUNTER — Emergency Department (HOSPITAL_COMMUNITY)
Admission: EM | Admit: 2019-05-28 | Discharge: 2019-05-28 | Disposition: A | Discharge status: Home / Self Care | Payer: BLUE CROSS/BLUE SHIELD | Attending: Emergency Medicine | Admitting: Emergency Medicine

## 2019-05-28 ENCOUNTER — Encounter (HOSPITAL_COMMUNITY): Payer: Self-pay | Admitting: Emergency Medicine

## 2019-05-28 ENCOUNTER — Other Ambulatory Visit: Payer: Self-pay

## 2019-05-28 DIAGNOSIS — L720 Epidermal cyst: Secondary | ICD-10-CM | POA: Insufficient documentation

## 2019-05-28 DIAGNOSIS — F17210 Nicotine dependence, cigarettes, uncomplicated: Secondary | ICD-10-CM | POA: Diagnosis not present

## 2019-05-28 DIAGNOSIS — R229 Localized swelling, mass and lump, unspecified: Secondary | ICD-10-CM | POA: Diagnosis present

## 2019-05-28 NOTE — Discharge Instructions (Signed)
Please read the attachment on epidermal cysts.  Given that your cyst has become bothersome, you ultimately would benefit from surgical intervention.  This would best be performed by dermatologist.  There is no evidence to suggest infection or abscess development at this time.  Now that you have [employer], it is vitally important that you become established with a primary care provider for ongoing evaluation and management of your health wellbeing.  They will be able to refer you to a dermatologist for surgical intervention.  I have also placed a referral for you.  Please call them to schedule appointment.  Return to the ED or seek immediate medical attention if you experience any new or worsening symptoms.

## 2019-05-28 NOTE — ED Notes (Signed)
Waiting for registration to be done to take patient out of computer.

## 2019-05-28 NOTE — ED Triage Notes (Addendum)
Patient arrives with complaints of a cyst to her left axilla that has been chronic for 2 years

## 2019-05-28 NOTE — ED Provider Notes (Signed)
[facility] DEPT Provider Note   CSN: [id number] Arrival date & time: 05/28/19  0111     History Chief Complaint  Patient presents with  . Abscess    Elizabeth Mason is a 54 y.o. female with no significant past medical history presents the ED with complaints of abscess.  Patient reports that for the past couple of years, she has had a persistent mass in the area of her left axilla.  My examination, patient reports that it feels "itchy" and has been increasingly bothersome.  She states that she is finally fed up which prompted her to come to the ED for evaluation.  She finally has received insurance offered through her employer and will look to establish with a primary care provider who accepts [employer] in the next week.  She does not take any medications regularly and does not have any significant past medical history.  She expresses concern for malignancy given her axillary region mass.  She states that her last mammogram was over 6 years ago.  She denies any recent illness, fevers or chills, night sweats, change in weight, tenderness, discharge, surrounding redness or firm skin, breast masses, or other symptoms.  HPI     History reviewed. No pertinent past medical history.  There are no problems to display for this patient.   Past Surgical History:  Procedure Laterality Date  . ABDOMINAL HYSTERECTOMY       OB History   No obstetric history on file.     No family history on file.  Social History   Tobacco Use  . Smoking status: Current Every Day Smoker    Types: Cigarettes  . Smokeless tobacco: Never Used  Substance Use Topics  . Alcohol use: Yes    Comment: occ beer  . Drug use: Yes    Frequency: 3.0 times per week    Types: Marijuana    Home Medications Prior to Admission medications   Medication Sig Start Date End Date Taking? Authorizing Provider  guaiFENesin (ROBITUSSIN) 100 MG/5ML SOLN Take 15 mLs by mouth every 4 (four) hours as  needed for cough or to loosen phlegm.   Yes [provider]  ondansetron (ZOFRAN ODT) 4 MG disintegrating tablet Take 1 tablet (4 mg total) by mouth every 8 (eight) hours as needed for nausea. Patient not taking: Reported on 08/31/2017 10/24/12   Larence Penning, MD  pantoprazole (PROTONIX) 20 MG tablet Take 1 tablet (20 mg total) by mouth 2 (two) times daily before a meal. Patient not taking: Reported on 05/28/2019 08/31/17   Dowless, Dondra Spry, PA-C    Allergies    Patient has no known allergies.  Review of Systems   Review of Systems  Constitutional: Negative for fever and unexpected weight change.  Cardiovascular: Negative for chest pain.  Skin: Positive for color change. Negative for pallor and rash.  Neurological: Negative for weakness and numbness.    Physical Exam Updated Vital Signs BP 137/85   Pulse 86   Resp 16   SpO2 98%   Physical Exam Vitals and nursing note reviewed. Exam conducted with a chaperone present.  Constitutional:      General: She is not in acute distress.    Appearance: Normal appearance. She is not ill-appearing.  HENT:     Head: Normocephalic and atraumatic.  Eyes:     General: No scleral icterus.    Conjunctiva/sclera: Conjunctivae normal.  Cardiovascular:     Rate and Rhythm: Normal rate and regular rhythm.  Pulses: Normal pulses.     Heart sounds: Normal heart sounds.  Pulmonary:     Effort: Pulmonary effort is normal. No respiratory distress.     Breath sounds: Normal breath sounds.  Musculoskeletal:     Cervical back: Normal range of motion. No rigidity.     Comments: Left axillary region: 3 x 3 cm mobile, firm mass.  No fluctuance.  No significant erythema.  No surrounding erythema or induration.  Nontender to palpation.  Skin:    General: Skin is dry.     Capillary Refill: Capillary refill takes less than 2 seconds.  Neurological:     Mental Status: She is alert and oriented to person, place, and time.     GCS: GCS eye  subscore is 4. GCS verbal subscore is 5. GCS motor subscore is 6.  Psychiatric:        Mood and Affect: Mood normal.        Behavior: Behavior normal.        Thought Content: Thought content normal.         ED Results / Procedures / Treatments   Labs (all labs ordered are listed, but only abnormal results are displayed) Labs Reviewed - No data to display  EKG None  Radiology No results found.  Procedures Procedures (including critical care time)  Medications Ordered in ED Medications - No data to display  ED Course  I have reviewed the triage vital signs and the nursing notes.  Pertinent labs & imaging results that were available during my care of the patient were reviewed by me and considered in my medical decision making (see chart for details).    MDM Rules/Calculators/A&P                      Patient's history and physical exam is consistent with epidermoid cyst.  No surgical intervention is emergently warranted at this time.  Encouraging patient to get established with a primary care provider for ongoing evaluation and management of her cyst.  She would likely benefit from repeat mammogram to assess for breast lesions, however she denies any palpable masses.  Very low suspicion for an abscess as the mass feels firm, no fluctuance, and no significant erythema.  Given chronicity, much more likely to be a cyst that would benefit from dermatologic intervention.  She denies any fevers, chills, or B symptoms.  Do not feel as though laboratory work-up or imaging is warranted at this time.  Discussed with Dr. Jacqulyn Bath who agrees with assessment and plan.    Strict ED return precautions discussed with the patient.   Final Clinical Impression(s) / ED Diagnoses Final diagnoses:  Epidermoid cyst    Rx / DC Orders ED Discharge Orders    None       Lorelee New, PA-C 05/28/19 5176    Maia Plan, MD 05/29/19 [phone]

## 2019-12-16 ENCOUNTER — Other Ambulatory Visit: Payer: Self-pay

## 2019-12-16 ENCOUNTER — Emergency Department (HOSPITAL_COMMUNITY)
Admission: EM | Admit: 2019-12-16 | Discharge: 2019-12-16 | Disposition: A | Discharge status: Home / Self Care | Payer: BLUE CROSS/BLUE SHIELD | Attending: Emergency Medicine | Admitting: Emergency Medicine

## 2019-12-16 ENCOUNTER — Emergency Department (HOSPITAL_COMMUNITY): Payer: BLUE CROSS/BLUE SHIELD

## 2019-12-16 ENCOUNTER — Encounter (HOSPITAL_COMMUNITY): Payer: Self-pay | Admitting: Emergency Medicine

## 2019-12-16 DIAGNOSIS — F17210 Nicotine dependence, cigarettes, uncomplicated: Secondary | ICD-10-CM | POA: Insufficient documentation

## 2019-12-16 DIAGNOSIS — S6991XA Unspecified injury of right wrist, hand and finger(s), initial encounter: Secondary | ICD-10-CM | POA: Diagnosis present

## 2019-12-16 DIAGNOSIS — S52501A Unspecified fracture of the lower end of right radius, initial encounter for closed fracture: Secondary | ICD-10-CM | POA: Insufficient documentation

## 2019-12-16 DIAGNOSIS — W292XXA Contact with other powered household machinery, initial encounter: Secondary | ICD-10-CM | POA: Insufficient documentation

## 2019-12-16 NOTE — ED Triage Notes (Signed)
C/o wrist injury. 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 No deformity.

## 2019-12-16 NOTE — ED Notes (Signed)
Patient verbalizes understanding of discharge instructions. Opportunity for questioning and answers were provided. Armband removed by staff, pt discharged from ED ambulatory.   
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
## 2019-12-16 NOTE — Progress Notes (Addendum)
Orthopedic Tech Progress Note Patient Details:  Elizabeth Mason August 24, 1965 [id number] I held while [name] wrapped Ortho Devices Type of Ortho Device: Sugartong splint, Arm sling Ortho Device/Splint Location: RUE Ortho Device/Splint Interventions: Ordered, Application, Adjustment   Post Interventions Patient Tolerated: Well, Fair Instructions Provided: Care of device   Donald Pore 12/16/2019, 1:28 PM

## 2019-12-16 NOTE — ED Notes (Signed)
Ortho tech at bedside 

## 2019-12-16 NOTE — ED Notes (Signed)
Ortho tech called 

## 2019-12-16 NOTE — Discharge Instructions (Signed)
Take 4 over the counter ibuprofen tablets 3 times a day or 2 over-the-counter naproxen tablets twice a day for pain. Also take tylenol 1000mg (2 extra strength) four times a day.    Return for worsening pain numbness or tingling to the hand.  Follow-up with hand surgery in the office.

## 2021-04-10 ENCOUNTER — Encounter (HOSPITAL_COMMUNITY): Payer: Self-pay

## 2021-04-10 ENCOUNTER — Emergency Department (HOSPITAL_COMMUNITY): Payer: Self-pay

## 2021-04-10 ENCOUNTER — Inpatient Hospital Stay (HOSPITAL_COMMUNITY)
Admission: EM | Admit: 2021-04-10 | Discharge: 2021-04-12 | DRG: 918 | Payer: Self-pay | Attending: Family Medicine | Admitting: Family Medicine

## 2021-04-10 DIAGNOSIS — T1491XA Suicide attempt, initial encounter: Secondary | ICD-10-CM

## 2021-04-10 DIAGNOSIS — F17210 Nicotine dependence, cigarettes, uncomplicated: Secondary | ICD-10-CM | POA: Diagnosis present

## 2021-04-10 DIAGNOSIS — F1491 Cocaine use, unspecified, in remission: Secondary | ICD-10-CM | POA: Diagnosis present

## 2021-04-10 DIAGNOSIS — Z560 Unemployment, unspecified: Secondary | ICD-10-CM

## 2021-04-10 DIAGNOSIS — Z20822 Contact with and (suspected) exposure to covid-19: Secondary | ICD-10-CM | POA: Diagnosis present

## 2021-04-10 DIAGNOSIS — Y905 Blood alcohol level of 100-119 mg/100 ml: Secondary | ICD-10-CM | POA: Diagnosis present

## 2021-04-10 DIAGNOSIS — R2232 Localized swelling, mass and lump, left upper limb: Secondary | ICD-10-CM

## 2021-04-10 DIAGNOSIS — R2233 Localized swelling, mass and lump, upper limb, bilateral: Secondary | ICD-10-CM

## 2021-04-10 DIAGNOSIS — F329 Major depressive disorder, single episode, unspecified: Secondary | ICD-10-CM | POA: Diagnosis present

## 2021-04-10 DIAGNOSIS — T502X2A Poisoning by carbonic-anhydrase inhibitors, benzothiadiazides and other diuretics, intentional self-harm, initial encounter: Secondary | ICD-10-CM | POA: Diagnosis present

## 2021-04-10 DIAGNOSIS — T484X2A Poisoning by expectorants, intentional self-harm, initial encounter: Secondary | ICD-10-CM | POA: Diagnosis present

## 2021-04-10 DIAGNOSIS — Z720 Tobacco use: Secondary | ICD-10-CM

## 2021-04-10 DIAGNOSIS — T50902A Poisoning by unspecified drugs, medicaments and biological substances, intentional self-harm, initial encounter: Secondary | ICD-10-CM

## 2021-04-10 DIAGNOSIS — R591 Generalized enlarged lymph nodes: Secondary | ICD-10-CM | POA: Diagnosis present

## 2021-04-10 DIAGNOSIS — T461X2A Poisoning by calcium-channel blockers, intentional self-harm, initial encounter: Principal | ICD-10-CM | POA: Diagnosis present

## 2021-04-10 DIAGNOSIS — F1090 Alcohol use, unspecified, uncomplicated: Secondary | ICD-10-CM

## 2021-04-10 DIAGNOSIS — F1010 Alcohol abuse, uncomplicated: Secondary | ICD-10-CM

## 2021-04-10 LAB — CBC WITH DIFFERENTIAL/PLATELET
Abs Immature Granulocytes: 0.01 10*3/uL (ref 0.00–0.07)
Basophils Absolute: 0 10*3/uL (ref 0.0–0.1)
Basophils Relative: 0 %
Eosinophils Absolute: 0 10*3/uL (ref 0.0–0.5)
Eosinophils Relative: 0 %
HCT: 38.9 % (ref 36.0–46.0)
Hemoglobin: 13.1 g/dL (ref 12.0–15.0)
Immature Granulocytes: 0 %
Lymphocytes Relative: 29 %
Lymphs Abs: 2 10*3/uL (ref 0.7–4.0)
MCH: 35.7 pg — ABNORMAL HIGH (ref 26.0–34.0)
MCHC: 33.7 g/dL (ref 30.0–36.0)
MCV: 106 fL — ABNORMAL HIGH (ref 80.0–100.0)
Monocytes Absolute: 0.5 10*3/uL (ref 0.1–1.0)
Monocytes Relative: 7 %
Neutro Abs: 4.4 10*3/uL (ref 1.7–7.7)
Neutrophils Relative %: 64 %
Platelets: 316 10*3/uL (ref 150–400)
RBC: 3.67 MIL/uL — ABNORMAL LOW (ref 3.87–5.11)
RDW: 12.1 % (ref 11.5–15.5)
WBC: 6.9 10*3/uL (ref 4.0–10.5)
nRBC: 0 % (ref 0.0–0.2)

## 2021-04-10 LAB — COMPREHENSIVE METABOLIC PANEL
ALT: 33 U/L (ref 0–44)
AST: 39 U/L (ref 15–41)
Albumin: 3.6 g/dL (ref 3.5–5.0)
Alkaline Phosphatase: 70 U/L (ref 38–126)
Anion gap: 14 (ref 5–15)
BUN: 10 mg/dL (ref 6–20)
CO2: 18 mmol/L — ABNORMAL LOW (ref 22–32)
Calcium: 8.6 mg/dL — ABNORMAL LOW (ref 8.9–10.3)
Chloride: 105 mmol/L (ref 98–111)
Creatinine, Ser: 0.57 mg/dL (ref 0.44–1.00)
GFR, Estimated: >60 mL/min (ref >60)
Glucose, Bld: 77 mg/dL (ref 70–99)
Potassium: 3.4 mmol/L — ABNORMAL LOW (ref 3.5–5.1)
Sodium: 137 mmol/L (ref 135–145)
Total Bilirubin: 0.7 mg/dL (ref 0.3–1.2)
Total Protein: 6.7 g/dL (ref 6.5–8.1)

## 2021-04-10 LAB — ACETAMINOPHEN LEVEL
Acetaminophen (Tylenol), Serum: <10 ug/mL — ABNORMAL LOW (ref 10–30)
Acetaminophen (Tylenol), Serum: <10 ug/mL — ABNORMAL LOW (ref 10–30)

## 2021-04-10 LAB — LACTIC ACID, PLASMA
Lactic Acid, Venous: 4.1 mmol/L (ref 0.5–1.9)
Lactic Acid, Venous: 4.6 mmol/L (ref 0.5–1.9)
Lactic Acid, Venous: 1.1 mmol/L (ref 0.5–1.9)

## 2021-04-10 LAB — CBC
HCT: 38.2 % (ref 36.0–46.0)
Hemoglobin: 13.1 g/dL (ref 12.0–15.0)
MCH: 36 pg — ABNORMAL HIGH (ref 26.0–34.0)
MCHC: 34.3 g/dL (ref 30.0–36.0)
MCV: 104.9 fL — ABNORMAL HIGH (ref 80.0–100.0)
Platelets: 338 10*3/uL (ref 150–400)
RBC: 3.64 MIL/uL — ABNORMAL LOW (ref 3.87–5.11)
RDW: 12 % (ref 11.5–15.5)
WBC: 7.9 10*3/uL (ref 4.0–10.5)
nRBC: 0 % (ref 0.0–0.2)

## 2021-04-10 LAB — TROPONIN I (HIGH SENSITIVITY)
Troponin I (High Sensitivity): 4 ng/L (ref <18)
Troponin I (High Sensitivity): 4 ng/L (ref <18)

## 2021-04-10 LAB — CBG MONITORING, ED: Glucose-Capillary: 95 mg/dL (ref 70–99)

## 2021-04-10 LAB — MAGNESIUM
Magnesium: 2.2 mg/dL (ref 1.7–2.4)
Magnesium: 1.9 mg/dL (ref 1.7–2.4)

## 2021-04-10 LAB — RAPID URINE DRUG SCREEN, HOSP PERFORMED
Amphetamines: NOT DETECTED
Barbiturates: NOT DETECTED
Benzodiazepines: NOT DETECTED
Cocaine: NOT DETECTED
Opiates: NOT DETECTED
Tetrahydrocannabinol: NOT DETECTED

## 2021-04-10 LAB — AMMONIA: Ammonia: 23 umol/L (ref 9–35)

## 2021-04-10 LAB — I-STAT BETA HCG BLOOD, ED (MC, WL, AP ONLY): I-stat hCG, quantitative: <5 m[IU]/mL (ref <5)

## 2021-04-10 LAB — ETHANOL: Alcohol, Ethyl (B): 110 mg/dL — ABNORMAL HIGH (ref <10)

## 2021-04-10 LAB — SALICYLATE LEVEL: Salicylate Lvl: <7 mg/dL — ABNORMAL LOW (ref 7.0–30.0)

## 2021-04-10 LAB — HIV ANTIBODY (ROUTINE TESTING W REFLEX): HIV Screen 4th Generation wRfx: NONREACTIVE

## 2021-04-10 MED ORDER — ENOXAPARIN SODIUM 40 MG/0.4ML IJ SOSY: 40.0000 mg | PREFILLED_SYRINGE | INTRAMUSCULAR | Status: DC

## 2021-04-10 MED ORDER — THIAMINE HCL 100 MG PO TABS
100.0000 mg | ORAL_TABLET | Freq: Every day | ORAL | Status: DC
  Administered 2021-04-11 – 2021-04-12 (×2): 100 mg via ORAL
  Filled 2021-04-10 (×2): qty 1

## 2021-04-10 MED ORDER — LORAZEPAM 1 MG PO TABS: 1.0000 mg | ORAL_TABLET | ORAL | Status: DC | PRN

## 2021-04-10 MED ORDER — NICOTINE 21 MG/24HR TD PT24
21.0000 mg | MEDICATED_PATCH | Freq: Every day | TRANSDERMAL | Status: DC
  Administered 2021-04-10 – 2021-04-12 (×3): 21 mg via TRANSDERMAL
  Filled 2021-04-10 (×3): qty 1

## 2021-04-10 MED ORDER — ADULT MULTIVITAMIN W/MINERALS CH
1.0000 | ORAL_TABLET | Freq: Every day | ORAL | Status: DC
  Administered 2021-04-10 – 2021-04-12 (×3): 1 via ORAL
  Filled 2021-04-10 (×3): qty 1

## 2021-04-10 MED ORDER — THIAMINE HCL 100 MG/ML IJ SOLN
100.0000 mg | Freq: Every day | INTRAMUSCULAR | Status: DC
  Administered 2021-04-10: 100 mg via INTRAVENOUS
  Filled 2021-04-10: qty 2

## 2021-04-10 MED ORDER — LACTATED RINGERS IV BOLUS
1000.0000 mL | Freq: Once | INTRAVENOUS | Status: AC
  Administered 2021-04-10: 1000 mL via INTRAVENOUS

## 2021-04-10 MED ORDER — ENOXAPARIN SODIUM 300 MG/3ML IJ SOLN
0.5000 mg/kg | INTRAMUSCULAR | Status: DC
  Administered 2021-04-10: 20 mg via SUBCUTANEOUS
  Filled 2021-04-10: qty 0.2

## 2021-04-10 MED ORDER — FOLIC ACID 1 MG PO TABS
1.0000 mg | ORAL_TABLET | Freq: Every day | ORAL | Status: DC
  Administered 2021-04-10 – 2021-04-12 (×3): 1 mg via ORAL
  Filled 2021-04-10 (×3): qty 1

## 2021-04-10 MED ORDER — LORAZEPAM 2 MG/ML IJ SOLN: 1.0000 mg | INTRAMUSCULAR | Status: DC | PRN
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
## 2021-04-10 NOTE — Hospital Course (Addendum)
Elizabeth Mason is a 56 y.o.female with a history of tobacco and alcohol use who was admitted to the [facility] Teaching Service at [facility] for intentional overdose. Her hospital course is detailed below: ? ?Intentional overdose ?Ingestion of amlodipine, unknown diuretic, Mucinex DM in the setting of first-time intentional overdose.  Patient received 1 L LR bolus in the ED and advised by poison control to monitor for 12 hours. Labwork unremarkable. Reached out to poison control after 12 hours and pt was medically cleared. Pt had 1:1 sitter throughout hospitalization. Psychiatry was consulted and they recommended IVC for psychiatric hospital. Pt was discharged to [facility]. ? ?Left axillary mass ?Presenting with worsening axillary mass with developing tracts and secondary nodular lesions highly concerning for malignancy. Left axillary U/S showed left axillary lymph node mass with smaller adjacent node. Possible malignancy with recommended tissue sampling and mammography. CT head performed due to concern of recent acute mental status change (suicidal attempt) in the setting of likely malignancy; results were unremarkable. Oncology (Dr. Al Pimple) advised outpatient referral once PCP was established. ? ?PCP Follow-up Recommendations: ?Left axillary mass workup, diagnostic mammogram outpatient and referral to oncology ?Discuss alcohol and smoking cessation ?

## 2021-04-10 NOTE — ED Notes (Addendum)
Spoke w. Kathlene November at [facility] poison control ?Recommendations ?Monitor for seizures r/t dextromethorphan. Labs 4 hour tylenol, magnesium, CBC. ?Amlodipine- monitor BP/HR. Fluids, pressors (Levophed up to 300), calcium. High dose insulin if refractory to pressors and fluids. ? ?

## 2021-04-10 NOTE — ED Notes (Signed)
Patient given medications to swallow, patient immediately vomited and reports that she cannot keep pills down. Patient eating dinner at the time and reports she can tolerate food but cannot swallow pills at this time. ?
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
## 2021-04-10 NOTE — ED Notes (Signed)
Got patient undressed on the monitor did ekg shown to Dr Anitra Lauth got patient some warm blankets patient is resting with call bell in reach  ?
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
## 2021-04-11 ENCOUNTER — Inpatient Hospital Stay (HOSPITAL_COMMUNITY): Payer: Self-pay

## 2021-04-11 ENCOUNTER — Other Ambulatory Visit: Payer: Self-pay

## 2021-04-11 ENCOUNTER — Observation Stay (HOSPITAL_COMMUNITY): Payer: Self-pay

## 2021-04-11 DIAGNOSIS — F1010 Alcohol abuse, uncomplicated: Secondary | ICD-10-CM

## 2021-04-11 DIAGNOSIS — F329 Major depressive disorder, single episode, unspecified: Secondary | ICD-10-CM | POA: Diagnosis not present

## 2021-04-11 DIAGNOSIS — T1491XA Suicide attempt, initial encounter: Secondary | ICD-10-CM | POA: Diagnosis not present

## 2021-04-11 LAB — BASIC METABOLIC PANEL
Anion gap: 8 (ref 5–15)
BUN: 7 mg/dL (ref 6–20)
CO2: 26 mmol/L (ref 22–32)
Calcium: 8.9 mg/dL (ref 8.9–10.3)
Chloride: 101 mmol/L (ref 98–111)
Creatinine, Ser: 0.77 mg/dL (ref 0.44–1.00)
GFR, Estimated: >60 mL/min (ref >60)
Glucose, Bld: 96 mg/dL (ref 70–99)
Potassium: 3.4 mmol/L — ABNORMAL LOW (ref 3.5–5.1)
Sodium: 135 mmol/L (ref 135–145)

## 2021-04-11 MED ORDER — ENOXAPARIN SODIUM 40 MG/0.4ML IJ SOSY
40.0000 mg | PREFILLED_SYRINGE | INTRAMUSCULAR | Status: DC
  Administered 2021-04-11: 40 mg via SUBCUTANEOUS
  Filled 2021-04-11: qty 0.4

## 2021-04-11 MED ORDER — POTASSIUM CHLORIDE 20 MEQ PO PACK
40.0000 meq | PACK | Freq: Once | ORAL | Status: AC
  Administered 2021-04-11: 40 meq via ORAL
  Filled 2021-04-11: qty 2

## 2021-04-11 MED ORDER — ENOXAPARIN SODIUM 300 MG/3ML IJ SOLN
20.0000 mg | INTRAMUSCULAR | Status: DC
  Filled 2021-04-11: qty 0.2

## 2021-04-11 NOTE — Evaluation (Signed)
Physical Therapy Evaluation & Discharge ?Patient Details ?Name: Elizabeth Mason ?MRN: [id number] ?DOB: 1965-09-13 ?Today's Date: 04/11/2021 ? ?History of Present Illness ? Pt is a 56 year old female admitted 04/10/21 due to intentional overdose. PMH includes Alcohol and tobaco use.  ?Clinical Impression ? Patient evaluated by Physical Therapy with no further acute PT needs identified. PTA, pt independent, does not have a car so she walks everywhere, lives with boyfriend. Today, pt independent with mobility and ADL tasks. All education has been completed and the patient has no further questions. Acute PT is signing off. Thank you for this referral. ?   ?HR up to 140 with mobility   ? ?Recommendations for follow up therapy are one component of a multi-disciplinary discharge planning process, led by the attending physician.  Recommendations may be updated based on patient status, additional functional criteria and insurance authorization. ? ?Follow Up Recommendations No PT follow up ? ?  ?Assistance Recommended at Discharge PRN  ?Patient can return home with the following ? Direct supervision/assist for medications management;Assist for transportation ? ?  ?Equipment Recommendations None recommended by PT  ?Recommendations for Other Services ?    ?  ?Functional Status Assessment Patient has not had a recent decline in their functional status  ? ?  ?Precautions / Restrictions Precautions ?Precautions: Other (comment) ?Precaution Comments: suicide sitter ?Restrictions ?Weight Bearing Restrictions: No  ? ?  ? ?Mobility ? Bed Mobility ?Overal bed mobility: Modified Independent ?  ?  ?  ?  ?  ?  ?General bed mobility comments: HOB elevated ?  ? ?Transfers ?Overall transfer level: Independent ?Equipment used: None ?  ?  ?  ?  ?  ?  ?  ?  ?  ? ?Ambulation/Gait ?Ambulation/Gait assistance: Independent ?Gait Distance (Feet): 620 Feet ?  ?Gait Pattern/deviations: Step-through pattern, Decreased stride length ?Gait velocity:  Decreased ?  ?  ?General Gait Details: Slow, guarded gait indep without DME; 2x self-corrected instability when first getting out of bed, pt attributes to "just waking up" ? ?Stairs ?  ?  ?  ?  ?  ? ?Wheelchair Mobility ?  ? ?Modified Rankin (Stroke Patients Only) ?  ? ?  ? ?Balance Overall balance assessment: Independent ?  ?Sitting balance-Leahy Scale: Good ?  ?  ?Standing balance support: No upper extremity supported ?Standing balance-Leahy Scale: Good ?  ?  ?  ?  ?  ?  ?  ?  ?High Level Balance Comments: self-corrected LOB when picking object up off floor ?Standardized Balance Assessment ?Standardized Balance Assessment : Dynamic Gait Index ?  ?Dynamic Gait Index ?Level Surface: Mild Impairment (slowed speed) ?Change in Gait Speed: Mild Impairment (no significant change in speed) ?Gait with Horizontal Head Turns: Normal ?Gait with Vertical Head Turns: Normal ?Gait and Pivot Turn: Normal ?Step Over Obstacle: Mild Impairment ?Step Around Obstacles: Normal ?Steps: Mild Impairment ?Total Score: 20 ?   ? ? ? ?Pertinent Vitals/Pain Pain Assessment ?Pain Assessment: No/denies pain  ? ? ?Home Living Family/patient expects to be discharged to:: Private residence ?Living Arrangements: Spouse/significant other ?Available Help at Discharge: Family ?Type of Home: Other(Comment) (condo) ?Home Access: Stairs to enter ?Entrance Stairs-Rails: None ?Entrance Stairs-Number of Steps: 1 ?Alternate Level Stairs-Number of Steps: 14 ?Home Layout: Two level;Bed/bath upstairs ?Home Equipment: None ?   ?  ?Prior Function Prior Level of Function : Independent/Modified Independent ?  ?  ?  ?  ?  ?  ?Mobility Comments: Independent; does not have car, so she walks to groceries,  etc. Reports feeling off balance with "near falls" after first waking up; denies falls ?  ?  ? ? ?Hand Dominance  ? Dominant Hand: Right ? ?  ?Extremity/Trunk Assessment  ? Upper Extremity Assessment ?Upper Extremity Assessment: Defer to OT evaluation ?LUE Deficits /  Details: L axillary mass and repoted to be tender ?  ? ?Lower Extremity Assessment ?Lower Extremity Assessment: Overall WFL for tasks assessed ?  ? ?Cervical / Trunk Assessment ?Cervical / Trunk Assessment: Kyphotic  ?Communication  ? Communication: No difficulties  ?Cognition Arousal/Alertness: Awake/alert ?Behavior During Therapy: [facility] for tasks assessed/performed, Flat affect ?Overall Cognitive Status: Within Functional Limits for tasks assessed ?  ?  ?  ?  ?  ?  ?  ?  ?  ?  ?  ?  ?  ?  ?  ?  ?General Comments: fatigued having just woken up; pleasant and participatory ?  ?  ? ?  ?General Comments General comments (skin integrity, edema, etc.): HR up to 140 while using bathroom ? ?  ?Exercises    ? ?Assessment/Plan  ?  ?PT Assessment Patient does not need any further PT services  ?PT Problem List   ? ?   ?  ?PT Treatment Interventions     ? ?PT Goals (Current goals can be found in the Care Plan section)  ?Acute Rehab PT Goals ?PT Goal Formulation: All assessment and education complete, DC therapy ? ?  ?Frequency   ?  ? ? ?Co-evaluation   ?  ?  ?  ?  ? ? ?  ?AM-PAC PT "6 Clicks" Mobility  ?Outcome Measure Help needed turning from your back to your side while in a flat bed without using bedrails?: None ?Help needed moving from lying on your back to sitting on the side of a flat bed without using bedrails?: None ?Help needed moving to and from a bed to a chair (including a wheelchair)?: None ?Help needed standing up from a chair using your arms (e.g., wheelchair or bedside chair)?: None ?Help needed to walk in hospital room?: None ?Help needed climbing 3-5 steps with a railing? : None ?6 Click Score: 24 ? ?  ?End of Session Equipment Utilized During Treatment: Gait belt ?Activity Tolerance: Patient tolerated treatment well ?Patient left: in bed;with call bell/phone within reach;with nursing/sitter in room ?Nurse Communication: Mobility status ?PT Visit Diagnosis: Other abnormalities of gait and mobility (R26.89) ?   ? ?Time: [id number] ?PT Time Calculation (min) (ACUTE ONLY): 15 min ? ? ?Charges:   PT Evaluation ?$PT Eval Low Complexity: 1 Low ?  ?  ?   ?Mabeline Caras, PT, DPT ?Acute Rehabilitation Services  ?Pager [phone] ?Office [phone] ? ?Derry Lory ?04/11/2021, 11:13 AM ? ?

## 2021-04-11 NOTE — Progress Notes (Signed)
CSW faxed IVC paperwork to magistrate, received custody order in return.  Phone call to [facility] communications-they will send [employer] for service of custody order.  Paperwork placed on chart, RN informed.   ?Daleen Squibb, MSW, LCSW ?3/23/20233:22 PM  ?

## 2021-04-11 NOTE — Evaluation (Signed)
Occupational Therapy Evaluation ?Patient Details ?Name: Elizabeth Mason ?MRN: [id number] ?DOB: January 05, 1966 ?Today's Date: 04/11/2021 ? ? ?History of Present Illness Pt is a 56 year old female who presented due to intentional overdose. PMH: Alcohol and tobaco use  ? ?Clinical Impression ?  ?Pt reported at PLOF lives with boyfriend in a condo with no AE/DME. Pt required supervision to min guard as noted when first ambulating as noted postural sway. Pt educated on shower chair and grab bars as they report they have had LOB but no falls at home.  Pt communicated about going back to live with boyfriend at discharge. Pt currently with functional limitations due to the deficits listed below (see OT Problem List).  Pt will benefit from skilled OT to increase their safety and independence with ADL and functional mobility for ADL to facilitate discharge to venue listed below.  ?  ?   ? ?Recommendations for follow up therapy are one component of a multi-disciplinary discharge planning process, led by the attending physician.  Recommendations may be updated based on patient status, additional functional criteria and insurance authorization.  ? ?Follow Up Recommendations ? No OT follow up  ?  ?Assistance Recommended at Discharge PRN  ?Patient can return home with the following Assistance with cooking/housework ? ?  ?Functional Status Assessment ? Patient has had a recent decline in their functional status and demonstrates the ability to make significant improvements in function in a reasonable and predictable amount of time.  ?Equipment Recommendations ? Tub/shower seat  ?  ?Recommendations for Other Services   ? ? ?  ?Precautions / Restrictions Precautions ?Precautions: Other (comment) (sucide watch with sitter) ?Restrictions ?Weight Bearing Restrictions: No  ? ?  ? ?Mobility Bed Mobility ?Overal bed mobility: Independent ?  ?  ?  ?  ?  ?  ?  ?  ? ?Transfers ?Overall transfer level: Modified independent ?Equipment used: None ?  ?  ?   ?  ?  ?  ?  ?  ?  ? ?  ?Balance Overall balance assessment: Mild deficits observed, not formally tested ?  ?  ?  ?  ?  ?  ?  ?  ?  ?  ?  ?  ?  ?  ?  ?  ?  ?  ?   ? ?ADL either performed or assessed with clinical judgement  ? ?ADL Overall ADL's : Needs assistance/impaired ?Eating/Feeding: Independent ?  ?Grooming: Dance movement psychotherapist;Supervision/safety;Standing ?  ?Upper Body Bathing: Sitting;Independent ?  ?Lower Body Bathing: Supervison/ safety;Sit to/from stand ?  ?Upper Body Dressing : Independent ?  ?Lower Body Dressing: Supervision/safety;Sit to/from stand ?  ?Toilet Transfer: Independent ?  ?Toileting- Clothing Manipulation and Hygiene: Modified independent ?  ?Tub/ Shower Transfer: Min guard;Cueing for safety;Cueing for sequencing;Grab bars ?  ?Functional mobility during ADLs: Min guard ?General ADL Comments: Pt reported fatigue and feeling a little off balance when first ambulating  ? ? ? ?Vision Baseline Vision/History: 0 No visual deficits ?Ability to See in Adequate Light: 0 Adequate ?Patient Visual Report: No change from baseline ?   ?   ?Perception   ?  ?Praxis   ?  ? ?Pertinent Vitals/Pain Pain Assessment ?Pain Assessment: Faces ?Faces Pain Scale: Hurts a little bit ?Pain Location: L arm ?Pain Descriptors / Indicators: Tender  ? ? ? ?Hand Dominance Right ?  ?Extremity/Trunk Assessment Upper Extremity Assessment ?Upper Extremity Assessment: LUE deficits/detail ?LUE Deficits / Details: L axillary mass and repoted to be tender ?  ?Lower Extremity  Assessment ?Lower Extremity Assessment: Defer to PT evaluation ?  ?Cervical / Trunk Assessment ?Cervical / Trunk Assessment: Kyphotic ?  ?Communication Communication ?Communication: No difficulties ?  ?Cognition Arousal/Alertness: Awake/alert ?Behavior During Therapy: [facility] for tasks assessed/performed ?Overall Cognitive Status: Within Functional Limits for tasks assessed ?  ?  ?  ?  ?  ?  ?  ?  ?  ?  ?  ?  ?  ?  ?  ?  ?  ?  ?  ?General Comments    ? ?  ?Exercises   ?   ?Shoulder Instructions    ? ? ?Home Living Family/patient expects to be discharged to:: Private residence ?Living Arrangements: Spouse/significant other ?Available Help at Discharge: Family ?Type of Home:  (Condo) ?Home Access: Stairs to enter ?Entrance Stairs-Number of Steps: 1 ?Entrance Stairs-Rails: None ?Home Layout: Two level;Bed/bath upstairs ?Alternate Level Stairs-Number of Steps: 14 ?  ?Bathroom Shower/Tub: Tub/shower unit;Curtain ?  ?Bathroom Toilet: Standard ?Bathroom Accessibility: Yes ?  ?Home Equipment: None ?  ?  ?  ? ?  ?Prior Functioning/Environment Prior Level of Function : Independent/Modified Independent (Pt reported near falls in AM hours) ?  ?  ?  ?  ?  ?  ?Mobility Comments: no AE but reported they get off balance sometimes ?  ?  ? ?  ?  ?OT Problem List: Decreased strength;Decreased activity tolerance;Impaired balance (sitting and/or standing);Decreased knowledge of use of DME or AE;Pain ?  ?   ?OT Treatment/Interventions: Self-care/ADL training;DME and/or AE instruction;Therapeutic activities;Patient/family education;Balance training  ?  ?OT Goals(Current goals can be found in the care plan section) Acute Rehab OT Goals ?Patient Stated Goal: to get stronger ?OT Goal Formulation: With patient ?Time For Goal Achievement: 04/26/21 ?Potential to Achieve Goals: Good ?ADL Goals ?Pt Will Perform Lower Body Bathing: Independently;sit to/from stand ?Pt Will Perform Tub/Shower Transfer: Tub transfer;with modified independence;ambulating;shower seat ?Additional ADL Goal #1: Pt will tolerate 15 mins of activity with no SOB or LOB  ?OT Frequency: Min 2X/week ?  ? ?Co-evaluation   ?  ?  ?  ?  ? ?  ?AM-PAC OT "6 Clicks" Daily Activity     ?Outcome Measure Help from another person eating meals?: None ?Help from another person taking care of personal grooming?: None ?Help from another person toileting, which includes using toliet, bedpan, or urinal?: None ?Help from another person bathing (including  washing, rinsing, drying)?: A Little ?Help from another person to put on and taking off regular upper body clothing?: None ?Help from another person to put on and taking off regular lower body clothing?: A Little ?6 Click Score: 22 ?  ?End of Session Equipment Utilized During Treatment: Gait belt ?Nurse Communication: Mobility status ? ?Activity Tolerance: Patient tolerated treatment well ?Patient left: in chair;with nursing/sitter in room;with call bell/phone within reach ? ?OT Visit Diagnosis: Unsteadiness on feet (R26.81);Muscle weakness (generalized) (M62.81)  ?              ?Time: [id number] ?OT Time Calculation (min): 28 min ?Charges:  OT General Charges ?$OT Visit: 1 Visit ?OT Evaluation ?$OT Eval Low Complexity: 1 Low ?OT Treatments ?$Self Care/Home Management : 8-22 mins ? ?Joeseph Amor OTR/L  ?Acute Rehab Services  ?[phone] office number ?[phone] pager number ? ? ?Joeseph Amor ?04/11/2021, 8:08 AM ?

## 2021-04-11 NOTE — Progress Notes (Signed)
Family Medicine Teaching Service ?Daily Progress Note ?Intern Pager: [phone] ? ?Patient name: Elizabeth Mason Medical record number: [id number] ?Date of birth: December 27, 1965 Age: 56 y.o. Gender: female ? ?Primary Care Provider: Patient, No Pcp Per (Inactive) ?Consultants: None ?Code Status: Full ? ?Pt Overview and Major Events to Date:  ?3/22: Admitted ? ?Assessment and Plan: ?Elizabeth Mason is a 56 y.o. female presenting with an intentional medication overdose . PMH is significant for tobacco and alcohol use. ? ?Intentional overdose: Resolved ?Cleared by poison control after monitoring for 12 hours.  Lactic acid improved to 1.1.  Hemodynamically stable.  Appropriate now for behavioral health services. ?-Consult psychiatry/[facility], appreciate recommendations ?-PT/OT eval and treat ? ?Left axillary mass: Stable ?Unchanged in acute setting but will very much need outpatient work-up. Will discuss with radiology for suggestions of imaging given patient will likely be delayed for this if admitted for psych hospital. Would benefit from imaging and biopsy. ?-TOC for PCP needs ?-Discuss imaging with radiology ? ?Alcohol/tobacco use ?CIWA 0, 0. Hemodynamically stable without agitation. ?-CIWA per protocol ?-TOC for alcohol abuse counseling/education ?-Nicotine patch 21 mg ?-Nurse to provide smoking/tobacco cessation education ? ?FEN/GI: Regular diet ?PPx: Lovenox ?Dispo: Pending [facility] recommendation.  ? ?Subjective:  ?States she spoke with her fiance on the phone and he feels it was his fault that she felt like this and she feels better about her mood at this time. Is amenable to resources being provided today and discussion with psychiatry. Feels well otherwise. Has been able to eat but has had difficulty with medications. ? ?Objective: ?Temp:  [97.1 ?F (36.2 ?C)-98.6 ?F (37 ?C)] 98.6 ?F (37 ?C) (03/23 0500) ?Pulse Rate:  [80-114] 80 (03/23 0500) ?Resp:  [14-22] 16 (03/23 0500) ?BP: (109-154)/(77-126) 126/80 (03/23 0500) ?SpO2:  [94  %-100 %] 99 % (03/23 0500) ?Weight:  [43 kg] 43 kg (03/22 1240) ?Physical Exam: ?General: awake, alert, NAD sitting in recliner ?Cardiovascular: RRR, no murmurs auscultated ?Respiratory: CTAB, no increased WOB ?Psych: denies SI/HI ? ?Laboratory: ?Recent Labs  ?Lab 04/10/21 ?1303 04/10/21 ?1617  ?WBC 7.9 6.9  ?HGB 13.1 13.1  ?HCT 38.2 38.9  ?PLT 338 316  ? ?Recent Labs  ?Lab 04/10/21 ?1303 04/11/21 ?[id number]  ?NA 137 135  ?K 3.4* 3.4*  ?CL 105 101  ?CO2 18* 26  ?BUN 10 7  ?CREATININE 0.57 0.77  ?CALCIUM 8.6* 8.9  ?PROT 6.7  --   ?BILITOT 0.7  --   ?ALKPHOS 70  --   ?ALT 33  --   ?AST 39  --   ?GLUCOSE 77 96  ? ? ?Imaging/Diagnostic Tests: ?DG Chest Port 1 View ? ?Result Date: 04/10/2021 ?CLINICAL DATA:  Drug overdose EXAM: PORTABLE CHEST 1 VIEW COMPARISON:  08/31/2017 FINDINGS: The heart size and mediastinal contours are within normal limits. Both lungs are clear. The visualized skeletal structures are unremarkable. Linear metallic density superimposed over the right lower lung fields may be an artifact. Less likely possibility would be foreign body in the right chest. IMPRESSION: No active disease. Electronically Signed   By: Elmer Picker M.D.   On: 04/10/2021 13:12   ? ? ?Wells Guiles, DO ?04/11/2021, 7:36 AM ?PGY-1, [place] [facility] ?[place] Intern pager: [phone], text pages welcome ? ?

## 2021-04-11 NOTE — Progress Notes (Signed)
Mobility Specialist Progress Note  ? ? 04/11/21 1457  ?Mobility  ?Activity Ambulated with assistance in hallway  ?Level of Assistance Contact guard assist, steadying assist  ?Assistive Device None  ?Distance Ambulated (ft) 800 ft  ?Activity Response Tolerated well  ?$Mobility charge 1 Mobility  ? ?Pt received in bed and agreeable. No complaints. Returned to bed with NT present.  ? ?[place] ?Mobility Specialist  ?  ?
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
## 2021-04-12 ENCOUNTER — Other Ambulatory Visit: Payer: Self-pay | Admitting: Psychiatry

## 2021-04-12 ENCOUNTER — Encounter (HOSPITAL_COMMUNITY): Payer: Self-pay | Admitting: Psychiatry

## 2021-04-12 ENCOUNTER — Telehealth: Payer: Self-pay | Admitting: Student

## 2021-04-12 ENCOUNTER — Inpatient Hospital Stay (HOSPITAL_COMMUNITY)
Admission: AD | Admit: 2021-04-12 | Discharge: 2021-04-16 | DRG: 885 | Disposition: A | Discharge status: Home / Self Care | Payer: Federal, State, Local not specified - Other | Source: Intra-hospital | Attending: Psychiatry | Admitting: Psychiatry

## 2021-04-12 ENCOUNTER — Other Ambulatory Visit: Payer: Self-pay

## 2021-04-12 DIAGNOSIS — N390 Urinary tract infection, site not specified: Secondary | ICD-10-CM | POA: Diagnosis present

## 2021-04-12 DIAGNOSIS — F1090 Alcohol use, unspecified, uncomplicated: Secondary | ICD-10-CM | POA: Diagnosis present

## 2021-04-12 DIAGNOSIS — F329 Major depressive disorder, single episode, unspecified: Secondary | ICD-10-CM | POA: Diagnosis not present

## 2021-04-12 DIAGNOSIS — Y905 Blood alcohol level of 100-119 mg/100 ml: Secondary | ICD-10-CM | POA: Diagnosis present

## 2021-04-12 DIAGNOSIS — Z720 Tobacco use: Secondary | ICD-10-CM | POA: Diagnosis present

## 2021-04-12 DIAGNOSIS — F332 Major depressive disorder, recurrent severe without psychotic features: Principal | ICD-10-CM | POA: Diagnosis present

## 2021-04-12 DIAGNOSIS — E46 Unspecified protein-calorie malnutrition: Secondary | ICD-10-CM

## 2021-04-12 DIAGNOSIS — F321 Major depressive disorder, single episode, moderate: Secondary | ICD-10-CM | POA: Diagnosis not present

## 2021-04-12 DIAGNOSIS — T1491XA Suicide attempt, initial encounter: Secondary | ICD-10-CM | POA: Diagnosis not present

## 2021-04-12 DIAGNOSIS — R2232 Localized swelling, mass and lump, left upper limb: Secondary | ICD-10-CM

## 2021-04-12 DIAGNOSIS — Z20822 Contact with and (suspected) exposure to covid-19: Secondary | ICD-10-CM | POA: Diagnosis present

## 2021-04-12 DIAGNOSIS — F411 Generalized anxiety disorder: Secondary | ICD-10-CM | POA: Diagnosis present

## 2021-04-12 DIAGNOSIS — G4700 Insomnia, unspecified: Secondary | ICD-10-CM | POA: Diagnosis present

## 2021-04-12 DIAGNOSIS — Z681 Body mass index (BMI) 19 or less, adult: Secondary | ICD-10-CM

## 2021-04-12 DIAGNOSIS — T50902A Poisoning by unspecified drugs, medicaments and biological substances, intentional self-harm, initial encounter: Secondary | ICD-10-CM | POA: Diagnosis present

## 2021-04-12 DIAGNOSIS — T50902S Poisoning by unspecified drugs, medicaments and biological substances, intentional self-harm, sequela: Secondary | ICD-10-CM

## 2021-04-12 DIAGNOSIS — F1010 Alcohol abuse, uncomplicated: Secondary | ICD-10-CM | POA: Diagnosis present

## 2021-04-12 DIAGNOSIS — F17210 Nicotine dependence, cigarettes, uncomplicated: Secondary | ICD-10-CM | POA: Diagnosis present

## 2021-04-12 LAB — RESP PANEL BY RT-PCR (FLU A&B, COVID) ARPGX2
Influenza A by PCR: NEGATIVE
Influenza B by PCR: NEGATIVE
SARS Coronavirus 2 by RT PCR: NEGATIVE

## 2021-04-12 LAB — BASIC METABOLIC PANEL
Anion gap: 9 (ref 5–15)
BUN: 13 mg/dL (ref 6–20)
CO2: 30 mmol/L (ref 22–32)
Calcium: 9.4 mg/dL (ref 8.9–10.3)
Chloride: 101 mmol/L (ref 98–111)
Creatinine, Ser: 0.93 mg/dL (ref 0.44–1.00)
GFR, Estimated: >60 mL/min (ref >60)
Glucose, Bld: 161 mg/dL — ABNORMAL HIGH (ref 70–99)
Potassium: 3.7 mmol/L (ref 3.5–5.1)
Sodium: 140 mmol/L (ref 135–145)

## 2021-04-12 LAB — TSH: TSH: 1.091 u[IU]/mL (ref 0.350–4.500)

## 2021-04-12 MED ORDER — LORAZEPAM 1 MG PO TABS: 1.0000 mg | ORAL_TABLET | ORAL | Status: DC | PRN

## 2021-04-12 MED ORDER — HYDROXYZINE HCL 25 MG PO TABS
25.0000 mg | ORAL_TABLET | Freq: Three times a day (TID) | ORAL | Status: DC | PRN
  Filled 2021-04-12: qty 1
  Filled 2021-04-12: qty 10

## 2021-04-12 MED ORDER — ENSURE ENLIVE PO LIQD
237.0000 mL | Freq: Two times a day (BID) | ORAL | Status: DC
Start: 2021-04-13 — End: 2021-04-16
  Administered 2021-04-13 – 2021-04-16 (×8): 237 mL via ORAL
  Filled 2021-04-12 (×11): qty 237

## 2021-04-12 MED ORDER — TRAZODONE HCL 50 MG PO TABS
50.0000 mg | ORAL_TABLET | Freq: Every evening | ORAL | Status: DC | PRN
  Administered 2021-04-12 – 2021-04-15 (×3): 50 mg via ORAL
  Filled 2021-04-12: qty 7
  Filled 2021-04-12 (×3): qty 1

## 2021-04-12 MED ORDER — OLANZAPINE 5 MG PO TBDP: 5.0000 mg | ORAL_TABLET | Freq: Three times a day (TID) | ORAL | Status: DC | PRN

## 2021-04-12 MED ORDER — ZIPRASIDONE MESYLATE 20 MG IM SOLR: 20.0000 mg | INTRAMUSCULAR | Status: DC | PRN

## 2021-04-12 MED ORDER — NICOTINE 14 MG/24HR TD PT24
14.0000 mg | MEDICATED_PATCH | Freq: Every day | TRANSDERMAL | Status: DC
  Administered 2021-04-13 – 2021-04-16 (×4): 14 mg via TRANSDERMAL
  Filled 2021-04-12 (×7): qty 1
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
## 2021-04-12 NOTE — TOC Transition Note (Signed)
Transition of Care (TOC) - CM/SW Discharge Note ? ? ?Patient Details  ?Name: Elizabeth Mason ?MRN: [id number] ?Date of Birth: 25-Mar-1965 ? ?[facility]) CM/SW Contact:  ?Lorri Frederick, LCSW ?Phone Number: ?04/12/2021, 1:37 PM ? ? ?Clinical Narrative: Pt discharging to [facility] on [address]  RN call report to [phone].  [facility] will transport due to pt being under IVC.   ? ? ? ?Final next level of care: Psychiatric Hospital ?Barriers to Discharge: Barriers Resolved ? ? ?Patient Goals and CMS Choice ?  ?  ?  ? ?Discharge Placement ?  ?           ?Patient chooses bed at:  (IVC to [facility]) ?Patient to be transferred to facility by: [facility] ?  ?Patient and family notified of of transfer: 04/12/21 ? ?Discharge Plan and Services ?  ?  ?           ?  ?  ?  ?  ?  ?  ?  ?  ?  ?  ? ?Social Determinants of Health (SDOH) Interventions ?  ? ? ?Readmission Risk Interventions ?   ? View : No data to display.  ?  ?  ?  ? ? ? ? ? ?

## 2021-04-12 NOTE — Progress Notes (Signed)
FPTS Brief Progress Note ? ?S: Patient seen resting comfortably in bed.  Patient was asleep so did not awaken. ? ? ?O: ?BP 124/83 (BP Location: Right Arm)   Pulse 98   Temp 98.1 ?F (36.7 ?C) (Oral)   Resp 20   Ht 5\' 2"  (1.575 m)   Wt 43 kg   SpO2 98%   BMI 17.34 kg/m?   ? ? ?A/P: ?-Plans per day team and psychiatry including axillary mass workup, IVC, and dispo to inpatient psychiatry when able. ?-CIWAs 0 ?- Orders reviewed. Labs for AM ordered, which was adjusted as needed.  ?- If condition changes, plan includes bedside eval.  ? ? , MD ?04/12/2021, 2:03 AM ?PGY-1, [place] [facility] Night Resident  ?Please page [phone] with questions.  ? ?

## 2021-04-12 NOTE — Tx Team (Signed)
Initial Treatment Plan ?04/12/2021 ?4:26 PM ?Elizabeth Mason ?[id number] ? ? ? ?PATIENT STRESSORS: ?Financial difficulties   ?Health problems   ?Marital or family conflict   ? ? ?PATIENT STRENGTHS: ?Ability for insight  ?Communication skills  ?Motivation for treatment/growth  ?Supportive family/friends  ? ? ?PATIENT IDENTIFIED PROBLEMS: ?"To be less stress"  ?"To go home"  ?Depression  ?Suicidal ideation  ?Ineffective coping skills  ?  ?  ?  ?  ?  ? ?DISCHARGE CRITERIA:  ?Ability to meet basic life and health needs ?Adequate post-discharge living arrangements ?Motivation to continue treatment in a less acute level of care ? ?PRELIMINARY DISCHARGE PLAN: ?Attend aftercare/continuing care group ?Outpatient therapy ?Return to previous living arrangement ? ?PATIENT/FAMILY INVOLVEMENT: ?This treatment plan has been presented to and reviewed with the patient, Elizabeth Mason, and/or family member.  The patient and family have been given the opportunity to ask questions and make suggestions. ? ?Clarene Critchley, RN ?04/12/2021, 4:26 PM ?
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
## 2021-04-12 NOTE — Progress Notes (Signed)
Adult Psychoeducational Group Note ? ?Date:  04/12/2021 ?Time:  10:17 PM ? ?Group Topic/Focus:  ?Wrap-Up Group:   The focus of this group is to help patients review their daily goal of treatment and discuss progress on daily workbooks. ? ?Participation Level:  Active ? ?Participation Quality:  Appropriate ? ?Affect:  Appropriate ? ?Cognitive:  Appropriate ? ?Insight: Appropriate ? ?Engagement in Group:  Engaged ? ?Modes of Intervention:  Discussion ? ?Additional Comments:  patient attend wrap up group. ? ?Charna Busman Long ?04/12/2021, 10:17 PM ?

## 2021-04-12 NOTE — Progress Notes (Signed)
?   04/12/21 2000  ?Psych Admission Type (Psych Patients Only)  ?Admission Status Involuntary  ?Psychosocial Assessment  ?Patient Complaints Anxiety  ?Eye Contact Fair  ?Facial Expression Sad  ?Affect Anxious;Sad  ?Speech Logical/coherent  ?Interaction Assertive  ?Motor Activity Slow  ?Appearance/Hygiene Unremarkable  ?Behavior Characteristics Cooperative  ?Mood Pleasant  ?Aggressive Behavior  ?Effect No apparent injury  ?Thought Process  ?Coherency Circumstantial  ?Content Blaming others  ?Delusions WDL  ?Perception WDL  ?Hallucination None reported or observed  ?Judgment WDL  ?Confusion WDL  ?Danger to Self  ?Current suicidal ideation? Denies  ?Danger to Others  ?Danger to Others None reported or observed  ? ? ?

## 2021-04-12 NOTE — Progress Notes (Signed)
Family Medicine Teaching Service ?Daily Progress Note ?Intern Pager: [phone] ? ?Patient name: Elizabeth Mason Medical record number: [id number] ?Date of birth: [birth year] Age: 56 y.o. Gender: female ? ?Primary Care Provider: Patient, No Pcp Per (Inactive) ?Consultants: Psychiatry ?Code Status: Full ? ?Pt Overview and Major Events to Date:  ?3/22: Admitted ? ?Assessment and Plan: ?Shabrea Weldin is a 56 y.o. female presenting with an intentional medication overdose . PMH is significant for tobacco and alcohol use. ? ?Intentional overdose: resolved ?Psychiatry believes patient to be high risk of self harm in her current setting and will IVC for psychiatric hospitalization. ?-Psychiatry following, appreciate assistance ? ?Left axillary mass: Stable ?Patient will require diagnostic mammogram.  CT head without acute findings.  Left axillary ultrasound showed left axillary lymph node mass with smaller adjacent node. Possible malignancy with recommendations for tissue sampling and mammography.  Discussed with oncology who advised referral to oncology and patient may be set up with them prior to receiving imaging.  We will pursue referral once patient is established with [facility]. ? ?PCP needs ?Patient is amenable to having his PCP. She has been established and [facility] with first appointment on 04/23/2021 at 345.  Advised that clinic is residency teaching clinic and she may see several residents or attendings. ? ?FEN/GI: Regular diet ?PPx: Lovenox ?Dispo: Psychiatric hospital  today. Barriers include IVC.  ? ?Subjective:  ?Patient states she is doing well right now but is unhappy that she will be involuntarily committed.  She would like me to be her PCP. ? ?Objective: ?Temp:  [98 ?F (36.7 ?C)-98.1 ?F (36.7 ?C)] 98.1 ?F (36.7 ?C) (03/24 0900) ?Pulse Rate:  [94-98] 98 (03/23 1951) ?Resp:  [14-21] 21 (03/24 0900) ?BP: (109-124)/(72-83) 109/72 (03/24 0900) ?SpO2:  [98 %] 98 % (03/23 1858) ?Physical Exam: ?General: awake, alert, NAD, speaking  with daughter on the phone ?Cardiovascular: RRR, no murmurs auscultated ?Respiratory: CTA B, no increased work of breathing ? ?Laboratory: ?Recent Labs  ?Lab 04/10/21 ?1303 04/10/21 ?1617  ?WBC 7.9 6.9  ?HGB 13.1 13.1  ?HCT 38.2 38.9  ?PLT 338 316  ? ?Recent Labs  ?Lab 04/10/21 ?1303 04/11/21 ?8088 04/12/21 ?1103  ?NA 137 135 140  ?K 3.4* 3.4* 3.7  ?CL 105 101 101  ?CO2 18* 26 30  ?BUN 10 7 13   ?CREATININE 0.57 0.77 0.93  ?CALCIUM 8.6* 8.9 9.4  ?PROT 6.7  --   --   ?BILITOT 0.7  --   --   ?ALKPHOS 70  --   --   ?ALT 33  --   --   ?AST 39  --   --   ?GLUCOSE 77 96 161*  ? ?TSH nl ? ?Imaging/Diagnostic Tests: ?CT HEAD WO CONTRAST ( ) ? ?Result Date: 04/11/2021 ?CLINICAL DATA:  Mental status change of unknown cause. EXAM: CT HEAD WITHOUT CONTRAST TECHNIQUE: Contiguous axial images were obtained from the base of the skull through the vertex without intravenous contrast. RADIATION DOSE REDUCTION: This exam was performed according to the departmental dose-optimization program which includes automated exposure control, adjustment of the mA and/or kV according to patient size and/or use of iterative reconstruction technique. COMPARISON:  None. FINDINGS: Brain: The brain shows a normal appearance without evidence of malformation, atrophy, old or acute small or large vessel infarction, mass lesion, hemorrhage, hydrocephalus or extra-axial collection. Vascular: There is atherosclerotic calcification of the major vessels at the base of the brain. Skull: Normal.  No traumatic finding.  No focal bone lesion. Sinuses/Orbits: Sinuses are clear. Orbits appear normal. Mastoids  are clear. Other: None significant IMPRESSION: Negative head CT. Electronically Signed   By: Paulina Fusi M.D.   On: 04/11/2021 16:04  ? ?[place] AXILLA LEFT ? ?Result Date: 04/11/2021 ?CLINICAL DATA:  Left axillary lymph node EXAM: ULTRASOUND OF THE left AXILLA COMPARISON:  None FINDINGS: Ultrasound is performed, showing large lymph node in the left axilla. This  has lobular contours and heterogeneous irregular echotexture throughout. No cystic changes. Mild internal blood flow. The dominant lymph node measures 2.7 x 1.9 x 3.3 cm. There is an adjacent lymph node measuring 8 x 12 mm. IMPRESSION: Left axillary lymph node mass with smaller adjacent node. Possible malignancy. Recommend tissue sampling. Recommend mammography to rule out breast cancer. Electronically Signed   By: Marlan Palau M.D.   On: 04/11/2021 14:52   ? ? ?Shelby Mattocks, DO ?04/12/2021, 9:31 AM ?PGY-1, [facility] ?FPTS Intern pager: [phone], text pages welcome ? ?

## 2021-04-12 NOTE — Discharge Summary (Addendum)
Family Medicine Teaching Service ?Hospital Discharge Summary ? ?Patient name: Elizabeth Mason Medical record number: [id number] ?Date of birth: Mar 23, 1965 Age: 56 y.o. Gender: female ?Date of Admission: 04/10/2021  Date of Discharge: 04/12/2021 ?Admitting Physician: Carney Living, MD ? ?Primary Care Provider: Patient, No Pcp Per (Inactive), Dr. Shelby Mattocks, DO ?Consultants: Psychiatry ? ?Indication for Hospitalization: Intentional overdose ? ?Discharge Diagnoses/Problem List:  ?Principal Problem: ?  Intentional overdose (HCC) ?Active Problems: ?  Tobacco use ?  Alcohol abuse ?  Axillary mass, left ? ?Disposition: [facility] ? ?Discharge Condition: Stable ? ?Discharge Exam:  ?Blood pressure 109/72, pulse 98, temperature 98.1 ?F (36.7 ?C), temperature source Oral, resp. rate (!) 21, height 5\' 2"  (1.575 m), weight 43 kg, SpO2 98 %.  ?Gen: awake, alert, NAD ?CV: RRR, no murmurs auscultated ?Respiratory: CTAB, no increased work of breathing ?Psych: no active SI/HI ? ?Brief Hospital Course:  ?Elizabeth Mason is a 56 y.o.female with a history of tobacco and alcohol use who was admitted to the [facility] Teaching Service at [facility] for intentional overdose. Her hospital course is detailed below: ? ?Intentional overdose ?Ingestion of amlodipine, unknown diuretic, Mucinex DM in the setting of first-time intentional overdose.  Patient received 1 L LR bolus in the ED and advised by poison control to monitor for 12 hours. Labwork unremarkable. Reached out to poison control after 12 hours and pt was medically cleared. Pt had 1:1 sitter throughout hospitalization. Psychiatry was consulted and they recommended IVC for psychiatric hospital. Pt was discharged to [facility]. ? ?Left axillary mass ?Presenting with worsening axillary mass with developing tracts and secondary nodular lesions highly concerning for malignancy. Left axillary U/S showed left axillary lymph node mass with smaller adjacent node. Possible malignancy with recommended  tissue sampling and mammography. CT head performed due to concern of recent acute mental status change (suicidal attempt) in the setting of likely malignancy; results were unremarkable. Oncology (Dr. [facility]) advised outpatient referral once PCP was established. ? ?PCP Follow-up Recommendations: ?Left axillary mass workup, diagnostic mammogram outpatient and referral to oncology ?Discuss alcohol and smoking cessation ? ?Significant Procedures: None ? ?Significant Labs and Imaging:  ?Recent Labs  ?Lab 04/10/21 ?1303 04/10/21 ?1617  ?WBC 7.9 6.9  ?HGB 13.1 13.1  ?HCT 38.2 38.9  ?PLT 338 316  ? ?Recent Labs  ?Lab 04/10/21 ?1303 04/10/21 ?1319 04/10/21 ?1617 04/11/21 ?04/13/21 04/12/21 ?04/14/21  ?NA 137  --   --  135 140  ?K 3.4*  --   --  3.4* 3.7  ?CL 105  --   --  101 101  ?CO2 18*  --   --  26 30  ?GLUCOSE 77  --   --  96 161*  ?BUN 10  --   --  7 13  ?CREATININE 0.57  --   --  0.77 0.93  ?CALCIUM 8.6*  --   --  8.9 9.4  ?MG  --  2.2 1.9  --   --   ?ALKPHOS 70  --   --   --   --   ?AST 39  --   --   --   --   ?ALT 33  --   --   --   --   ?ALBUMIN 3.6  --   --   --   --   ? ?Results/Tests Pending at Time of Discharge: None ? ?Discharge Medications:  ?Allergies as of 04/12/2021   ?No Known Allergies ?  ? ?  ?Medication List  ?  ? ?STOP  taking these medications   ? ?ibuprofen 200 MG tablet ?Commonly known as: ADVIL ?  ?pantoprazole 20 MG tablet ?Commonly known as: PROTONIX ?  ? ?  ? ? ?Discharge Instructions: Please refer to Patient Instructions section of EMR for full details.  Patient was counseled important signs and symptoms that should prompt return to medical care, changes in medications, dietary instructions, activity restrictions, and follow up appointments.  ? ?Follow-Up Appointments: ?Future Appointments  ?Date Time Provider Department Center  ?04/23/2021  3:45 PM Shelby Mattocks, DO [facility]  ? ?Shelby Mattocks, DO ?04/12/2021, 11:09 AM ?PGY-1, [facility]  ?

## 2021-04-12 NOTE — Telephone Encounter (Signed)
Discussed with patient during hospitalization that I would order mammogram once I was established as PCP.  ?

## 2021-04-13 LAB — URINALYSIS, ROUTINE W REFLEX MICROSCOPIC
Bilirubin Urine: NEGATIVE
Glucose, UA: NEGATIVE mg/dL
Ketones, ur: NEGATIVE mg/dL
Nitrite: NEGATIVE
Protein, ur: NEGATIVE mg/dL
Specific Gravity, Urine: 1.01 (ref 1.005–1.030)
pH: 7 (ref 5.0–8.0)

## 2021-04-13 LAB — URINALYSIS, MICROSCOPIC (REFLEX): Squamous Epithelial / HPF: NONE SEEN (ref 0–5)

## 2021-04-13 MED ORDER — ACETAMINOPHEN 325 MG PO TABS
650.0000 mg | ORAL_TABLET | Freq: Four times a day (QID) | ORAL | Status: DC | PRN
  Administered 2021-04-13 – 2021-04-15 (×2): 650 mg via ORAL
  Filled 2021-04-13 (×2): qty 2

## 2021-04-13 NOTE — [facility] Suicide Risk Assessment (Signed)
Suicide Risk Assessment ? ?Admission Assessment    ?[facility] Admission Suicide Risk Assessment ? ? ?Nursing information obtained from:  Patient ? ?Demographic factors:  Low socioeconomic status, Unemployed ? ?Current Mental Status:  Self-harm thoughts ? ?Loss Factors:  Decline in physical health, Financial problems / change in socioeconomic status ? ?Historical Factors:  Impulsivity ? ?Risk Reduction Factors:  Living with another person, especially a relative ? ?Total Time spent with patient: 1 hour ? ?Principal Problem: MDD (major depressive disorder) ? ?Diagnosis:  Principal Problem: ?  MDD (major depressive disorder) ?Active Problems: ?  Intentional overdose ([place]) ?  Tobacco use ?  Alcohol abuse ? ?Subjective Data: 56 year old AA female. Patient is with no known hx of mental illness or treatments. She is admitted to the [facility] from the [facility] with complaint of intentional drug overdose in an attempt to kill herself. However, chart review reports indicated while at the ED, Janyth denies that this was a suicide attempt, rather, took the overdose to get her boyfriend to leave her alone because they were arguing at the time. After she took the overdose, her boyfriend call the ems & she was taken to the ED. During this evaluation, Timiya reports,  ? ?Continued Clinical Symptoms:  ?  ?The "Alcohol Use Disorders Identification Test", Guidelines for Use in Primary Care, Second Edition.  [employer] [facility]). ?Score between 0-7:  no or low risk or alcohol related problems. ?Score between 8-15:  moderate risk of alcohol related problems. ?Score between 16-19:  high risk of alcohol related problems. ?Score 20 or above:  warrants further diagnostic evaluation for alcohol dependence and treatment. ? ?CLINICAL FACTORS:  ? Depression:   Impulsivity ?Medical Diagnoses and Treatments/Surgeries ? ?Musculoskeletal: ?Strength & Muscle Tone: within normal limits ?Gait & Station: normal ?Patient leans:  N/A ? ?Psychiatric Specialty Exam: ? ?Presentation  ?General Appearance: Casual; Fairly Groomed (thin-frame) ? ?Eye Contact:Good ? ?Speech:Clear and Coherent; Normal Rate ? ?Speech Volume:Normal ? ?Handedness:Right ? ?Mood and Affect  ?Mood:-- (Currently denies any symptoms of depression or anxiety.) ? ?Affect:Appropriate; Congruent ? ?Thought Process  ?Thought Processes:Coherent ? ?Descriptions of Associations:Intact ? ?Orientation:Full (Time, Place and Person) ? ?Thought Content:Logical ? ?History of Schizophrenia/Schizoaffective disorder:No data recorded ?Duration of Psychotic Symptoms:No data recorded ?Hallucinations:Hallucinations: None ? ?Ideas of Reference:None ? ?Suicidal Thoughts:Suicidal Thoughts: No ? ?Homicidal Thoughts:Homicidal Thoughts: No ? ?Sensorium  ?Memory:Immediate Good; Recent Good; Remote Good ? ?Judgment:Fair ? ?Insight:Fair ? ?Executive Functions  ?Concentration:Good ? ?Attention Span:Good ? ?Recall:Good ? ?[employer] [place] ? ?Language:Good ? ?Psychomotor Activity  ?Psychomotor Activity:Psychomotor Activity: Normal ? ?Assets  ?Assets:Communication Skills; Desire for Improvement; Physical Health; Social Support ? ?Sleep  ?Sleep:Sleep: Fair ?Number of Hours of Sleep: 5.25 ? ?Physical Exam: ? ?Blood pressure 119/86, pulse (!) 107, temperature 98 ?F (36.7 ?C), temperature source Oral, resp. rate 20, height 5\' 2"  (1.575 m), weight 41.3 kg, SpO2 98 %. Body mass index is 16.64 kg/m?. ? ? ?COGNITIVE FEATURES THAT CONTRIBUTE TO RISK:  ?Closed-mindedness and Thought constriction (tunnel vision)   ? ?SUICIDE RISK:  ? Moderate:  Frequent suicidal ideation with limited intensity, and duration, some specificity in terms of plans, no associated intent, good self-control, limited dysphoria/symptomatology, some risk factors present, and identifiable protective factors, including available and accessible social support. ? ?PLAN OF CARE: See H&P. ? ?I certify that inpatient services furnished can  reasonably be expected to improve the patient's condition.  ? ?Lindell Spar, NP, pmhnp, fnp-bc ?04/13/2021, 11:43 AM ?

## 2021-04-13 NOTE — Progress Notes (Signed)
[facility] Group Notes:  (Nursing/MHT/Case Management/Adjunct) ? ?Date:  04/13/2021  ?Time:  2015 ? ?Type of Therapy:   wrap up group ? ?Participation Level:  Active ? ?Participation Quality:  Appropriate, Attentive, Sharing, and Supportive ? ?Affect:  Appropriate ? ?Cognitive:  Alert ? ?Insight:  Improving ? ?Engagement in Group:  Engaged ? ?Modes of Intervention:  Clarification, Education, and Support ? ?Summary of Progress/Problems: Positive thinking and positive change were discussed. Pt reports plans to return to school to complete her degree in physical therapy.  ? ?Winfield Rast S ?04/13/2021, 8:48 PM ?

## 2021-04-13 NOTE — H&P (Signed)
Psychiatric Admission Assessment Adult ? ?Patient Identification: Elizabeth Mason ? ?MRN:  [id number] ? ?Date of Evaluation:  04/13/2021 ? ?Chief Complaint: Intentional drug overdose. ? ?Principal Diagnosis: MDD (major depressive disorder) ? ?Diagnosis:  Principal Problem: ?  MDD (major depressive disorder) ?Active Problems: ?  Intentional overdose (HCC) ?  Tobacco use ?  Alcohol abuse ? ?History of Present Illness: This is the first psychiatric admission assessment in this [facility] for this 56 year old AA female. Patient is with no known hx of mental illness or treatments. She is admitted to the [facility] from the [facility] with complaint of intentional drug overdose in an attempt to kill herself. However, chart review reports indicated while at the ED, Elizabeth Mason denies that this was a suicide attempt, rather, took the overdose to get her boyfriend to leave her alone because they were arguing at the time. After she took the overdose, her boyfriend call the ems & she was taken to the ED. During this evaluation, Doneen reports,  ? ?"The ambulance took me to the ED 3 days ago. My fiancee called them. I had taken some of his medicines & some other over the counter medicines. I took the pills because of all that has been going on (we are losing our apartment.  I recently lost my job that I had worked for 5 years. 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 Then something started happening. We get paid every week, but my money was not going into my account. I realized that my information have been hacked & I decided to quit my job. On the day that I took the overdose, I was fighting with my boyfriend because we were about to lose out apartment. I got mad, took the pills because everything was going down. Problems has been piling up that I did not feel I could handle them any more. I took the pills to get my boyfriend to leave me alone & stop the argument. I get depressed sometimes, but I'm not feeling that  way today. I have a lump on my left armpit that needed surgery. It is painful & has been bothering me. They are working me up for surgery to drain it. I was told it is a cyst. I'm not depressed. I'm not feeling suicidal or homicidal. I do not hear voices or see things other people are unable to hear or see. I feel sad because I have court date pending for a weapon charge & also a court date to see if I should stay in my apartment or vacate it. I do not need to be on any medication for depression. I'm ready to be discharged".  ? ?On assessment today, [name] is seen in her room. She is lying down in her bed. She presents alert, oriented x 4. She presents with a good affect, good eye contact & verbally responsive. She reports feeling good. Says she has been bombarded with a lot of problems after problems lately. 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 She says she is facing eviction from her apart & a pending court date for a weapon charge. However, she maintained she is neither depressed nor suicidal. She declined to be on any medications at this time. She does not appear to be responding to any internal stimuli. She denies any hx of mental illnesses or suicide attempts. She  is requesting to be discharged. Although with a BAL of 110, patient is not presenting with any signs/symptoms of alcohol withdrawal. Denies any hx of alcohol withdrawal seizures. ? ?Associated Signs/Symptoms: ? ?Depression Symptoms:   "I'm not depressed, just feeling overwhelmed due to all that has been going on in my life". ? ?Duration of Depression Symptoms: No data recorded ? ?(Hypo) Manic Symptoms:  Impulsivity, ? ?Anxiety Symptoms:  Excessive Worry, ? ?Psychotic Symptoms:   Patient currently denies any auditory/visual hallucinations, delusional thoughts or paranoia. ? ?PTSD Symptoms: ?NA ? ?Total Time spent with patient: 1 hour ? ?Past Psychiatric  History: Denies any hx of  ? ?Is the patient at risk to self? No.  ?Has the patient been a risk to self in the past 6 months? Yes.    ?Has the patient been a risk to self within the distant past? No.  ?Is the patient a risk to others? No.  ?Has the patient been a risk to others in the past 6 months? No.  ?Has the patient been a risk to others within the distant past? No.  ? ?Prior Inpatient Therapy: Denies. ?Prior Outpatient Therapy: Denies ? ?Alcohol Screening: Yes, says she drinks 3 cans of beer daily. Denies any drug use. Says has not smoked weed in 2 years. ? ?Substance Abuse History in the last 12 months:  Yes.   ? ?Consequences of Substance Abuse: Discussed witg patient during this admission evaluation. ?Medical Consequences:  Liver damage, Possible death by overdose ?Legal Consequences:  Arrests, jail time, Loss of driving privilege. ?Family Consequences:  Family discord, divorce and or separation.  ? ?Previous Psychotropic Medications: No  ? ?Psychological Evaluations: No  ? ?Past Medical History: History reviewed. No pertinent past medical history.  ?Past Surgical History:  ?Procedure Laterality Date  ? ABDOMINAL HYSTERECTOMY    ? ?Family History: History reviewed. No pertinent family history. ? ?Family Psychiatric  History: Denies any familial hx of mental illness. ? ?Tobacco Screening: Smokes 2 packs of cigarettes daily. ? 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 Says has a weapon charge & pending court date for the weapon charge. ?Social History  ? ?Substance and Sexual Activity  ?Alcohol Use Yes  ? Alcohol/week: 21.0 standard drinks  ? Types: 21 Cans of beer per week  ? Comment: occ beer  ?   ?Social History  ? ?Substance and Sexual Activity  ?Drug Use Not Currently  ? Frequency: 3.0 times per week  ? Types: Marijuana  ?  ?Additional Social History: ? ?Allergies:  No Known Allergies ? ?Lab Results:  ?Results for orders placed or  performed during the hospital encounter of 04/10/21 (from the past 48 hour(s))  ?Basic metabolic panel     Status: Abnormal  ? Collection Time: 04/12/21  3:49 AM  ?Result Value Ref Range  ? Sodium 140 135 - 145 mmol/L  ? Potassium 3.7 3.5 - 5.1 mmol/L  ? Chloride 101 98 - 111 mmol/L  ? CO2 30 22 - 32 mmol/L  ? Glucose, Bld 161 (H) 70 - 99 mg/dL  ?  Comment: Glucose reference range applies only to samples taken after fasting for at least 8 hours.  ? BUN 13 6 - 20 mg/dL  ? Creatinine, Ser 0.93 0.44 - 1.00 mg/dL  ? Calcium 9.4 8.9 - 10.3 mg/dL  ? GFR, Estimated >60 >60 mL/min  ?  Comment: (NOTE) ?Calculated using the CKD-EPI Creatinine Equation (2021) ?  ? Anion gap 9  5 - 15  ?  Comment: Performed at [facility], [address] [place], [place] [address]  ?TSH     Status: None  ? Collection Time: 04/12/21  3:49 AM  ?Result Value Ref Range  ? TSH 1.091 0.350 - 4.500 uIU/mL  ?  Comment: Performed by a 3rd Generation assay with a functional sensitivity of <=0.01 uIU/mL. ?Performed at [facility], [address] [place], [place] [address] ?  ?Resp Panel by RT-PCR (Flu A&B, Covid) Nasopharyngeal Swab     Status: None  ? Collection Time: 04/12/21 11:12 AM  ? Specimen: Nasopharyngeal Swab; Nasopharyngeal(NP) swabs in vial transport medium  ?Result Value Ref Range  ? SARS Coronavirus 2 by RT PCR NEGATIVE NEGATIVE  ?  Comment: (NOTE) ?SARS-CoV-2 target nucleic acids are NOT DETECTED. ? ?The SARS-CoV-2 RNA is generally detectable in upper respiratory ?specimens during the acute phase of infection. The lowest ?concentration of SARS-CoV-2 viral copies this assay can detect is ?138 copies/mL. A negative result does not preclude SARS-Cov-2 ?infection and should not be used as the sole basis for treatment or ?other patient management decisions. A negative result may occur with  ?improper specimen collection/handling, submission of specimen other ?than nasopharyngeal swab, presence of viral mutation(s) within  the ?areas targeted by this assay, and inadequate number of viral ?copies(<138 copies/mL). A negative result must be combined with ?clinical observations, patient history, and epidemiological ?information. The exp

## 2021-04-13 NOTE — [facility] Suicide Risk Assessment (Signed)
[facility] INPATIENT:  Family/Significant Other Suicide Prevention Education ? ?Suicide Prevention Education:  ?Patient Refusal for Family/Significant Other Suicide Prevention Education: The patient Elizabeth Mason has refused to provide written consent for family/significant other to be provided Family/Significant Other Suicide Prevention Education during admission and/or prior to discharge.  Physician notified. ? ?Lynnell Chad ?04/13/2021, 1:40 PM ?
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
## 2021-04-13 NOTE — Group Note (Signed)
LCSW Group Therapy Note ? ?Group Date: 04/13/2021 ?Start Time: 1000 ?End Time: 1100 ? ? ?Type of Therapy and Topic:  Group Therapy - Healthy vs Unhealthy Coping Skills ? ?Participation Level:  Active  ? ?Description of Group ?The focus of this group was to determine what unhealthy coping techniques typically are used by group members and what healthy coping techniques would be helpful in coping with various problems. Patients were guided in becoming aware of the differences between healthy and unhealthy coping techniques. Patients were asked to identify 2-3 healthy coping skills they would like to learn to use more effectively. ? ?Therapeutic Goals ?Patients learned that coping is what human beings do all day long to deal with various situations in their lives ?Patients defined and discussed healthy vs unhealthy coping techniques ?Patients identified their preferred coping techniques and identified whether these were healthy or unhealthy ?Patients determined 2-3 healthy coping skills they would like to become more familiar with and use more often. ?Patients provided support and ideas to each other ? ? ?Summary of Patient Progress:  During group, Joneisha expressed that she has felt her life to be spiraling out of control and therefore she overdosed in a suicide attempt. Patient proved open to input from peers and feedback from CSW. Patient demonstrated fair insight into the subject matter, was respectful of peers, and participated throughout the entire session. ? ? ?Therapeutic Modalities ?Cognitive Behavioral Therapy ?Motivational Interviewing ? ?Lynnell Chad, LCSWA ?04/13/2021  1:51 PM   ?

## 2021-04-13 NOTE — Progress Notes (Signed)
Patient remains A&OX4, calm, linear thought pattern, clear speech, cooperative with medications and denies any S/E. Mood and affect are congruent.  Pt memory appears to be grossly intact.  Pt denies ideations and hallucinations. No complaints of distress, pain and/or discomfort. No c/o insomnia or abnormalities with diet. No C/O nightmares. Pt ambulates well without assistance. Steady gait.  Psychomotor activity was WNL. No s/s of Parkinson, Dystonia, Akathisia and/or Tardive Dyskinesia noted. ?

## 2021-04-13 NOTE — Progress Notes (Signed)
?   04/13/21 0500  ?Sleep  ?Number of Hours 5.25  ? ? ?

## 2021-04-14 DIAGNOSIS — Z681 Body mass index (BMI) 19 or less, adult: Secondary | ICD-10-CM

## 2021-04-14 DIAGNOSIS — E46 Unspecified protein-calorie malnutrition: Secondary | ICD-10-CM

## 2021-04-14 LAB — HEMOGLOBIN A1C
Hgb A1c MFr Bld: 5.2 % (ref 4.8–5.6)
Mean Plasma Glucose: 102.54 mg/dL

## 2021-04-14 MED ORDER — SULFAMETHOXAZOLE-TRIMETHOPRIM 800-160 MG PO TABS
1.0000 | ORAL_TABLET | Freq: Two times a day (BID) | ORAL | Status: DC
  Administered 2021-04-14 – 2021-04-16 (×4): 1 via ORAL
  Filled 2021-04-14: qty 1
  Filled 2021-04-14 (×2): qty 8
  Filled 2021-04-14 (×5): qty 1

## 2021-04-14 MED ORDER — FLUOXETINE HCL 10 MG PO CAPS
10.0000 mg | ORAL_CAPSULE | Freq: Every day | ORAL | Status: DC
  Administered 2021-04-14 – 2021-04-16 (×3): 10 mg via ORAL
  Filled 2021-04-14: qty 7
  Filled 2021-04-14 (×4): qty 1

## 2021-04-14 MED ORDER — MIRTAZAPINE 15 MG PO TABS: 15.0000 mg | ORAL_TABLET | Freq: Every day | ORAL | Status: DC

## 2021-04-14 NOTE — Progress Notes (Addendum)
[facility] MD Progress Note ? ?04/14/2021 9:36 AM ?Elizabeth Mason  ?MRN:  [id number] ? ?Subjective: [name] reports, "So far, so good. It has been great being here. The group sessions are fun, a pick me up kind of thing. I'm learning to think positive & not give-in to all the bad feelings I have been having for a while". ? ?Reason for admission: 56 year old AA female. Patient is with no known hx of mental illness or treatments. She is admitted to the [facility] from the [facility] with complaint of intentional drug overdose in an attempt to kill herself. However, chart review reports indicated while at the ED, Elizabeth Mason denies that this was a suicide attempt, rather, took the overdose to get her boyfriend to leave her alone because they were arguing at the time. After she took the overdose, her boyfriend call the ems & she was taken to the ED.  ? ?Daily notes: Rabab is seen, chart reviewed. The chart findings discussed with the treatment team. She presents alert, oriented & aware of situation. She is visible on the unit, attending group sessions. She is verbally responsive. She reports today that "so far so good.  She thinks being in this hospital has been great for her so far. She cited the group sessions as fun, pick-me up kind of thing. She says she is learning how to be positive & not give-in to all the bad feelings she has been having. Just yesterday, patient did not want to be on any medications for depression. Today, she had a change of mind & decided to try an antidepressant. She agrees on Fluoxetine 10 mg po daily. She reports sleeping well at night. Reports good appetite. She denies any SIHI, AVH, delusional thoughts or paranoia. She does not appear to be responding to any internal stimuli. She is started on antibiotic for uti x 7 days. See U/A results of 04-13-21. Will continue current plan of care as already in progress. ? ?Principal Problem: MDD (major depressive disorder) ? ?Diagnosis: Principal  Problem: ?  MDD (major depressive disorder) ?Active Problems: ?  Intentional overdose (HCC) ?  Tobacco use ?  Alcohol abuse ?  BMI less than 19,adult ? ?Total Time spent with patient:  35 minutes ? ?Past Psychiatric History: See H&P ? ?Past Medical History: History reviewed. No pertinent past medical history.  ?Past Surgical History:  ?Procedure Laterality Date  ? ABDOMINAL HYSTERECTOMY    ? ?Family History: History reviewed. No pertinent family history. ? ?Family Psychiatric  History: See H&P ? ?Social History:  ?Social History  ? ?Substance and Sexual Activity  ?Alcohol Use Yes  ? Alcohol/week: 21.0 standard drinks  ? Types: 21 Cans of beer per week  ? Comment: occ beer  ?   ?Social History  ? ?Substance and Sexual Activity  ?Drug Use Not Currently  ? Frequency: 3.0 times per week  ? Types: Marijuana  ?  ?Social History  ? ?Socioeconomic History  ? Marital status: Married  ?  Spouse name: Not on file  ? Number of children: Not on file  ? Years of education: Not on file  ? Highest education level: Not on file  ?Occupational History  ? Not on file  ?Tobacco Use  ? Smoking status: Every Day  ?  Packs/day: 2.00  ?  Types: Cigarettes  ?  Start date: 24  ? Smokeless tobacco: Never  ?Vaping Use  ? Vaping Use: Never used  ?Substance and Sexual Activity  ? Alcohol use:  Yes  ?  Alcohol/week: 21.0 standard drinks  ?  Types: 21 Cans of beer per week  ?  Comment: occ beer  ? Drug use: Not Currently  ?  Frequency: 3.0 times per week  ?  Types: Marijuana  ? Sexual activity: Yes  ?  Birth control/protection: None  ?Other Topics Concern  ? Not on file  ?Social History Narrative  ? Not on file  ? ?Social Determinants of Health  ? ?Financial Resource Strain: Not on file  ?Food Insecurity: Not on file  ?Transportation Needs: Not on file  ?Physical Activity: Not on file  ?Stress: Not on file  ?Social Connections: Not on file  ? ?Additional Social History:  ? ?Sleep: Good ? ?Appetite:  Good ? ?Current Medications: ?Current  Facility-Administered Medications  ?Medication Dose Route Frequency Provider Last Rate Last Admin  ? acetaminophen (TYLENOL) tablet 650 mg  650 mg Oral Q6H PRN Bobbitt, Shalon E, NP   650 mg at 04/13/21 0129  ? feeding supplement (ENSURE ENLIVE / ENSURE PLUS) liquid 237 mL  237 mL Oral BID BM Hill, Shelbie HutchingStephanie Leigh, MD   237 mL at 04/13/21 1332  ? hydrOXYzine (ATARAX) tablet 25 mg  25 mg Oral TID PRN Bobbitt, Shalon E, NP      ? OLANZapine zydis (ZYPREXA) disintegrating tablet 5 mg  5 mg Oral Q8H PRN Leevy-Johnson, Brooke A, NP      ? And  ? LORazepam (ATIVAN) tablet 1 mg  1 mg Oral PRN Leevy-Johnson, Lina SarBrooke A, NP      ? And  ? ziprasidone (GEODON) injection 20 mg  20 mg Intramuscular PRN Leevy-Johnson, Brooke A, NP      ? nicotine (NICODERM CQ - dosed in mg/24 hours) patch 14 mg  14 mg Transdermal Daily Hill, Shelbie HutchingStephanie Leigh, MD   14 mg at 04/14/21 0805  ? traZODone (DESYREL) tablet 50 mg  50 mg Oral QHS PRN Bobbitt, Shalon E, NP   50 mg at 04/12/21 2117  ? ?Lab Results:  ?Results for orders placed or performed during the hospital encounter of 04/12/21 (from the past 48 hour(s))  ?Hemoglobin A1c     Status: None  ? Collection Time: 04/13/21  6:32 PM  ?Result Value Ref Range  ? Hgb A1c MFr Bld 5.2 4.8 - 5.6 %  ?  Comment: (NOTE) ?Pre diabetes:          5.7%-6.4% ? ?Diabetes:              >6.4% ? ?Glycemic control for   <7.0% ?adults with diabetes ?  ? Mean Plasma Glucose 102.54 mg/dL  ?  Comment: Performed at [facility], [address] [place], [place] [address]  ?Urinalysis, Routine w reflex microscopic Urine, Clean Catch     Status: Abnormal  ? Collection Time: 04/13/21  6:58 PM  ?Result Value Ref Range  ? Color, Urine YELLOW YELLOW  ? APPearance CLEAR CLEAR  ? Specific Gravity, Urine 1.010 1.005 - 1.030  ? pH 7.0 5.0 - 8.0  ? Glucose, UA NEGATIVE NEGATIVE mg/dL  ? Hgb urine dipstick LARGE (A) NEGATIVE  ? Bilirubin Urine NEGATIVE NEGATIVE  ? Ketones, ur NEGATIVE NEGATIVE mg/dL  ? Protein, ur NEGATIVE  NEGATIVE mg/dL  ? Nitrite NEGATIVE NEGATIVE  ? Leukocytes,Ua LARGE (A) NEGATIVE  ?  Comment: Performed at [facility], [address] [place], [place] [address]  ?Urinalysis, Microscopic (reflex)     Status: Abnormal  ? Collection Time: 04/13/21  6:58  PM  ?Result Value Ref Range  ? RBC / HPF 0-5 0 - 5 RBC/hpf  ? WBC, UA 6-10 0 - 5 WBC/hpf  ? Bacteria, UA FEW (A) NONE SEEN  ? Squamous Epithelial / LPF NONE SEEN 0 - 5  ?  Comment: Performed at [facility], [address] [place], [place] [address]  ? ?Blood Alcohol level:  ?Lab Results  ?Component Value Date  ? ETH 110 (H) 04/10/2021  ? ETH 100 (H) 08/31/2017  ? ?Metabolic Disorder Labs: ?Lab Results  ?Component Value Date  ? HGBA1C 5.2 04/13/2021  ? MPG 102.54 04/13/2021  ? ?No results found for: PROLACTIN ?No results found for: CHOL, TRIG, HDL, CHOLHDL, VLDL, LDLCALC ? ?Physical Findings: ?AIMS:  , ,  ,  ,    ?CIWA:    ?COWS:    ? ?Musculoskeletal: ?Strength & Muscle Tone: within normal limits ?Gait & Station: normal ?Patient leans: N/A ? ?Psychiatric Specialty Exam: ? ?Presentation  ?General Appearance: Casual; Fairly Groomed (thin-frame) ? ?Eye Contact:Good ? ?Speech:Clear and Coherent; Normal Rate ? ?Speech Volume:Normal ? ?Handedness:Right ? ?Mood and Affect  ?Mood:-- (Currently denies any symptoms of depression or anxiety.) ? ?Affect:Appropriate; Congruent ? ?Thought Process  ?Thought Processes:Coherent ? ?Descriptions of Associations:Intact ? ?Orientation:Full (Time, Place and Person) ? ?Thought Content:Logical ? ?History of Schizophrenia/Schizoaffective disorder:No data recorded ?Duration of Psychotic Symptoms:No data recorded ?Hallucinations:Hallucinations: None ? ?Ideas of Reference:None ? ?Suicidal Thoughts:Suicidal Thoughts: No ? ?Homicidal Thoughts:Homicidal Thoughts: No ? ?Sensorium  ?Memory:Immediate Good; Recent Good; Remote Good ? ?Judgment:Fair ? ?Insight:Fair ? ?Executive Functions   ?Concentration:Good ? ?Attention Span:Good ? ?Recall:Good ? ?Fund of Knowledge:Good ? ?Language:Good ? ?Psychomotor Activity  ?Psychomotor Activity:Psychomotor Activity: Normal ? ?Assets  ?Assets:Communication Skills; Desire for Improvement; Phy

## 2021-04-14 NOTE — [facility] Group Notes (Signed)
.  Psychoeducational Group Note ? ? ? ?Date:04/14/21 ?Time: 1100-1200 ? ? ? ?Purpose of Group: . The group focus' on teaching patients on how to identify their needs and their Life Skills:  A group where two lists are made. What people need and what are things that we do that are unhealthy. The lists are developed by the patients and it is explained that we often do the actions that are not healthy to get our list of needs met. ? ?Goal:: to develop the coping skills needed to get their needs met ? ?Participation Level:  Active ? ?Participation Quality:  Appropriate ? ?Affect:  Appropriate ? ?Cognitive:  Oriented ? ?Insight:  Improving ? ?Engagement in Group:  Engaged ? ?Additional Comments: Pt attended the group. Rates her energy at a 9.5/10 Participated fully in the group. ? ?Bryson Dames A ? ?

## 2021-04-14 NOTE — Progress Notes (Signed)
?   04/13/21 2300  ?Psych Admission Type (Psych Patients Only)  ?Admission Status Involuntary  ?Psychosocial Assessment  ?Patient Complaints None  ?Eye Contact Fair  ?Facial Expression Flat  ?Affect Appropriate to circumstance  ?Speech Logical/coherent  ?Interaction Assertive  ?Motor Activity Slow  ?Appearance/Hygiene Improved  ?Behavior Characteristics Cooperative  ?Mood Pleasant  ?Thought Process  ?Coherency WDL  ?Content WDL  ?Delusions None reported or observed  ?Perception WDL  ?Hallucination None reported or observed  ?Judgment WDL  ?Confusion None  ?Danger to Self  ?Current suicidal ideation? Denies  ?Danger to Others  ?Danger to Others None reported or observed  ? ? ?

## 2021-04-14 NOTE — Group Note (Signed)
[facility] LCSW Group Therapy Note ? ?Date/Time:  04/14/2021  10:00AM-11:00AM ? ?Type of Therapy and Topic:  Group Therapy:  Music's Effect on Depression and Anxiety ? ?Participation Level:  Active  ? ?Description of Group: ?In this process group, members discussed what types of music trigger them to worsening mental health symptoms and what they can do about this in the future.  For instance, we discussed what to do when riding in a friend's car and a song harmful to the patient starts playing.  We also discussed how music can be used as a tool to help with wellness and recovery in various ways including managing depression and anxiety as well as encouraging healthy sleep habits and avoiding relapse into old negative behaviors such as drinking, self-harming, or staying in bed all day.  A variety of songs were played as examples.  Discussion was elicited which showed the group to be in agreement that the songs were quite relatable and have the potential to help them outside of the hospital setting. ? ?Therapeutic Goals: ?Patients will be introduced to a variety of songs that can relate to self-image and self-love in a helpful way. ?Patients will explore the impact of different pieces of music on their feelings, i.e. uplifting, triggering, etc. ?Patients will discuss how to use this self-knowledge to assist in recovery. ? ?Summary of Patient Progress:  At the beginning of group, patient expressed that several things that trigger her happiness are loud music, talking to positive people, running real fast, and using her uncle's punching bag.  She participated fully, was tearful at times stating that the songs reminded her of how she wants to feel about herself.  At the end of group patient reported that she wanted a copy of the list of songs so that she can create another playlist. ? ?Therapeutic Modalities: ?Solution Focused Brief Therapy ?Activity ? ? ?Ambrose Mantle, LCSW ? ?  ?

## 2021-04-14 NOTE — [facility] Group Notes (Signed)
Adult Psychoeducational Group  ?Date:  04/14/2021 ?Time:  7035-0093 ?Group Topic/Focus: Continuation of the group from Saturday. Looking at the lists that were created and talking about what needs to be done with the homework of 30 positives about themselves.  ?                                   Talking about taking their power back and helping themselves to develop a positive self esteem. ?     ?Participation Quality:  Appropriate ? ?Affect:  Appropriate ? ?Cognitive:  Oriented ? ?Insight: Improving ? ?Engagement in Group:  Engaged ? ?Modes of Intervention:  Activity, Discussion, Education, and Support ? ?Additional Comments:  Pt rqates her energy at a 9.1. Participated fully in the group. ? ?[name] ? ?

## 2021-04-14 NOTE — Progress Notes (Addendum)
Pt is A&OX4, calm, denies suicidal ideations, homicidal ideations, auditory hallucinations and visual hallucinations. Pt denies experiencing nightmares. Mood and affect are congruent. Pt appetite is ok. No complaints of distress, pain and/or discomfort at this time. Pt's memory appears to be grossly intact, and Pt doesn't display any injurious behaviors. Pt is medication compliant. There's no evidence of suicidal intent. Psychomotor activity was WNL. No s/s of Parkinson, Dystonia, Akathisia and/or Tardive Dyskinesia noted. Pt continues to be concerned about being evicted from her residence and several upcoming court appointments.  ?

## 2021-04-15 ENCOUNTER — Encounter (HOSPITAL_COMMUNITY): Payer: Self-pay

## 2021-04-15 DIAGNOSIS — F321 Major depressive disorder, single episode, moderate: Secondary | ICD-10-CM

## 2021-04-15 DIAGNOSIS — F1010 Alcohol abuse, uncomplicated: Secondary | ICD-10-CM

## 2021-04-15 MED ORDER — TRAZODONE HCL 50 MG PO TABS
50.0000 mg | ORAL_TABLET | Freq: Once | ORAL | Status: DC
  Filled 2021-04-15: qty 1

## 2021-04-15 NOTE — Group Note (Signed)
Recreation Therapy Group Note ? ? ?Group Topic:Stress Management  ?Group Date: 04/15/2021 ?Start Time: 0930 ?End Time: 1000 ?Facilitators: Caroll Rancher, LRT,CTRS ?Location: [address] Dayroom ? ? ?Goal Area(s) Addresses:  ?Patient will actively participate in stress management techniques presented during session.  ?Patient will successfully identify benefit of practicing stress management post d/c.  ? ?Group Description: Guided Imagery. LRT provided education, instruction, and demonstration on practice of visualization via guided imagery. Patient was asked to participate in the technique introduced during session. LRT debriefed including topics of mindfulness, stress management and specific scenarios each patient could use these techniques. Patients were given suggestions of ways to access scripts post d/c and encouraged to explore Youtube and other apps available on smartphones, tablets, and computers. ? ? ?Participation Level: Did not occur ?  ? ?Clinical Observations/Individualized Feedback: Due to illness on the unit, group did not occur at usual time.  Patients were given packets on mindfulness.  Packets discussed what mindfulness means, observing current thoughts and occasions when being mindful.  Patients were to also identify their strengths/qualities and what they want out of life and what's preventing it.   ? ? ?Plan: Continue to engage patient in RT group sessions 2-3x/week. ? ? ?Caroll Rancher, LRT,CTRS ?04/15/2021 12:26 PM ?

## 2021-04-15 NOTE — [facility] Group Notes (Signed)
Grief and Loss Group not held due to illness on the unit and restrictions on congregating in the dayroom.   ? ?Chaplain Katy Liesa Tsan, Bcc ?Pager, [phone] ?

## 2021-04-15 NOTE — BH IP Treatment Plan (Signed)
Interdisciplinary Treatment and Diagnostic Plan Update ? ?04/15/2021 ?Time of Session: 10:40am ?Elizabeth Mason ?MRN: [id number] ? ?Principal Diagnosis: MDD (major depressive disorder) ? ?Secondary Diagnoses: Principal Problem: ?  MDD (major depressive disorder) ?Active Problems: ?  Intentional overdose ([place]) ?  Tobacco use ?  Alcohol abuse ?  BMI less than 19,adult ? ? ?Current Medications:  ?Current Facility-Administered Medications  ?Medication Dose Route Frequency Provider Last Rate Last Admin  ? acetaminophen (TYLENOL) tablet 650 mg  650 mg Oral Q6H PRN Bobbitt, Shalon E, NP   650 mg at 04/15/21 0819  ? feeding supplement (ENSURE ENLIVE / ENSURE PLUS) liquid 237 mL  237 mL Oral BID BM Hill, Jackie Plum, MD   237 mL at 04/15/21 1044  ? FLUoxetine (PROZAC) capsule 10 mg  10 mg Oral Daily Lindell Spar I, NP   10 mg at 04/15/21 8144  ? hydrOXYzine (ATARAX) tablet 25 mg  25 mg Oral TID PRN Bobbitt, Shalon E, NP      ? OLANZapine zydis (ZYPREXA) disintegrating tablet 5 mg  5 mg Oral Q8H PRN Leevy-Johnson, Brooke A, NP      ? And  ? LORazepam (ATIVAN) tablet 1 mg  1 mg Oral PRN Leevy-Johnson, Blaine Hamper, NP      ? And  ? ziprasidone (GEODON) injection 20 mg  20 mg Intramuscular PRN Leevy-Johnson, Brooke A, NP      ? nicotine (NICODERM CQ - dosed in mg/24 hours) patch 14 mg  14 mg Transdermal Daily Hill, Jackie Plum, MD   14 mg at 04/15/21 0819  ? sulfamethoxazole-trimethoprim (BACTRIM DS) 800-160 MG per tablet 1 tablet  1 tablet Oral Q12H Lindell Spar I, NP   1 tablet at 04/15/21 8185  ? traZODone (DESYREL) tablet 50 mg  50 mg Oral QHS PRN Bobbitt, Shalon E, NP   50 mg at 04/14/21 2151  ? ?PTA Medications: ?No medications prior to admission.  ? ? ?Patient Stressors: Financial difficulties   ?Health problems   ?Marital or family conflict   ? ?Patient Strengths: Ability for insight  ?Communication skills  ?Motivation for treatment/growth  ?Supportive family/friends  ? ?Treatment Modalities: Medication Management,  Group therapy, Case management,  ?1 to 1 session with clinician, Psychoeducation, Recreational therapy. ? ? ?Physician Treatment Plan for Primary Diagnosis: MDD (major depressive disorder) ?Long Term Goal(s): Improvement in symptoms so as ready for discharge  ? ?Short Term Goals: Ability to identify and develop effective coping behaviors will improve ?Ability to maintain clinical measurements within normal limits will improve ?Compliance with prescribed medications will improve ?Ability to identify triggers associated with substance abuse/mental health issues will improve ?Ability to identify changes in lifestyle to reduce recurrence of condition will improve ?Ability to verbalize feelings will improve ?Ability to disclose and discuss suicidal ideas ?Ability to demonstrate self-control will improve ? ?Medication Management: Evaluate patient's response, side effects, and tolerance of medication regimen. ? ?Therapeutic Interventions: 1 to 1 sessions, Unit Group sessions and Medication administration. ? ?Evaluation of Outcomes: Not Met ? ?Physician Treatment Plan for Secondary Diagnosis: Principal Problem: ?  MDD (major depressive disorder) ?Active Problems: ?  Intentional overdose ([place]) ?  Tobacco use ?  Alcohol abuse ?  BMI less than 19,adult ? ?Long Term Goal(s): Improvement in symptoms so as ready for discharge  ? ?Short Term Goals: Ability to identify and develop effective coping behaviors will improve ?Ability to maintain clinical measurements within normal limits will improve ?Compliance with prescribed medications will improve ?Ability to identify triggers associated with substance  abuse/mental health issues will improve ?Ability to identify changes in lifestyle to reduce recurrence of condition will improve ?Ability to verbalize feelings will improve ?Ability to disclose and discuss suicidal ideas ?Ability to demonstrate self-control will improve    ? ?Medication Management: Evaluate patient's response, side  effects, and tolerance of medication regimen. ? ?Therapeutic Interventions: 1 to 1 sessions, Unit Group sessions and Medication administration. ? ?Evaluation of Outcomes: Not Met ? ? ?RN Treatment Plan for Primary Diagnosis: MDD (major depressive disorder) ?Long Term Goal(s): Knowledge of disease and therapeutic regimen to maintain health will improve ? ?Short Term Goals: Ability to remain free from injury will improve, Ability to verbalize frustration and anger appropriately will improve, Ability to demonstrate self-control, Ability to identify and develop effective coping behaviors will improve, and Compliance with prescribed medications will improve ? ?Medication Management: RN will administer medications as ordered by provider, will assess and evaluate patient's response and provide education to patient for prescribed medication. RN will report any adverse and/or side effects to prescribing provider. ? ?Therapeutic Interventions: 1 on 1 counseling sessions, Psychoeducation, Medication administration, Evaluate responses to treatment, Monitor vital signs and CBGs as ordered, Perform/monitor CIWA, COWS, AIMS and Fall Risk screenings as ordered, Perform wound care treatments as ordered. ? ?Evaluation of Outcomes: Not Met ? ? ?LCSW Treatment Plan for Primary Diagnosis: MDD (major depressive disorder) ?Long Term Goal(s): Safe transition to appropriate next level of care at discharge, Engage patient in therapeutic group addressing interpersonal concerns. ? ?Short Term Goals: Engage patient in aftercare planning with referrals and resources, Increase social support, Increase ability to appropriately verbalize feelings, Facilitate patient progression through stages of change regarding substance use diagnoses and concerns, and Identify triggers associated with mental health/substance abuse issues ? ?Therapeutic Interventions: Assess for all discharge needs, 1 to 1 time with Education officer, museum, Explore available resources and  support systems, Assess for adequacy in community support network, Educate family and significant other(s) on suicide prevention, Complete Psychosocial Assessment, Interpersonal group therapy. ? ?Evaluation of Outcomes: Not Met ? ? ?Progress in Treatment: ?Attending groups: No. and As evidenced by:  No groups being held on adult unit ?Participating in groups: No. ?Taking medication as prescribed: Yes. ?Toleration medication: Yes. ?Family/Significant other contact made: No, will contact:  declined consents ?Patient understands diagnosis: Yes. and No. ?Discussing patient identified problems/goals with staff: Yes. ?Medical problems stabilized or resolved: Yes. ?Denies suicidal/homicidal ideation: Yes. ?Issues/concerns per patient self-inventory: No. ? ?New problem(s) identified: No, Describe:  none ? ?New Short Term/Long Term Goal(s): medication stabilization, elimination of SI thoughts, development of comprehensive mental wellness plan.  ? ? ?Patient Goals:  "Work on myself" ? ?Discharge Plan or Barriers: Patient is currently declining all follow up.  ? ?Reason for Continuation of Hospitalization: Anxiety ?Depression ?Medication stabilization ?Suicidal ideation ? ?Estimated Length of Stay: 3-5 days ? ? ?Scribe for Treatment Team: ?Vassie Moselle, LCSW ?04/15/2021 ?11:59 AM ?

## 2021-04-15 NOTE — Progress Notes (Signed)
Elizabeth Mason reported her day was a 10/10 (10 the best) and attributed this to talking with her daughter, getting her son's phone number and reading in her room.  She reported her anxiety 3/10 and depression 1/10 (10 the worst).  She did report that her anxiety was up a little because she is unable to come out of her room, talk to people and walk around.  Provided her with word searches and encouraged her to let staff know if she needs some more puzzles.  She denied SI/HI or AVH.  Q 15 minute checks maintained for safety.  She remains safe on the unit. ? ? 04/15/21 2005  ?Psych Admission Type (Psych Patients Only)  ?Admission Status Involuntary  ?Psychosocial Assessment  ?Patient Complaints Insomnia  ?Eye Contact Fair  ?Facial Expression Animated  ?Affect Appropriate to circumstance  ?Speech Logical/coherent;Soft  ?Interaction Assertive  ?Motor Activity Slow  ?Appearance/Hygiene Unremarkable;In hospital gown  ?Behavior Characteristics Appropriate to situation;Cooperative  ?Mood Pleasant  ?Thought Process  ?Coherency WDL  ?Content WDL  ?Delusions None reported or observed  ?Perception WDL  ?Hallucination None reported or observed  ?Judgment WDL  ?Confusion None  ?Danger to Self  ?Current suicidal ideation? Denies  ?Danger to Others  ?Danger to Others None reported or observed  ? ? ?

## 2021-04-15 NOTE — Group Note (Signed)
? ?  Date/Time: 04/15/2021 @ 1pm ? ?Type of Therapy and Topic:  Group Therapy:  Distress Tolerance ? ?Participation Level: Active ? ?Description of Group:   ?Patients work on coming up with a distress tolerance plan.  Patients shared triggers and identified triggers that they have.  They were then able to identify thoughts, feelings, physical sensations and urges/actions that occur when they are triggered.  Patients worked on the coping mechanism of doing opposite actions as a way to cope when they are distressed.  Patients were able to provide clinicians goals and steps they would like to work on to assist with their distress tolerance plan. ? ?Therapeutic Goals: ?Patient will identify triggers that are contributing to a problem in their life ?Patient will identify unwanted behaviors, feelings, physical sensations and unwanted actions associated with a trigger.  ?Patient will share with other group members strategies to confront and avoid triggers so that they may be able to react appropriately to triggers in daily life.  ?Patient will work on a distress tolerance plan to assist them with coping with distress in future situations.  ? ? ?Summary of Patient Progress:  Due to illness on the unit, ID recommends no groups to be held.  Patient provided a packet about distress tolerance and advised to work through the packet to assist with additional coping skills.  Patient encouraged to seek out social work as they work through the packet to ask additional questions associated with topic.  Patient given opportunities to ask questions to CSW.  ? ? ? ? ?Therapeutic Modalities:   ?Cognitive Behavioral Therapy ?Solution Focused Therapy ?Motivational Interviewing ?Family Systems Approach ? ? ?Natalie Mceuen, LCSW, LCAS ?Clincal Social Worker  ?[facility] ? ? ?

## 2021-04-15 NOTE — Progress Notes (Signed)
D:  Elizabeth Mason was pleasant and cooperative.  She was soft spoken but appropriate.  She denied SI/HI or AVH.  She denied any pain or discomfort and appeared to be in no physical distress.  She questioned when the quarantine will be lifted as she is getting bored in her room.  Informed her unfortunately I don't have an answer for that at this time.  Provided her word searches, snacks, fluids and coping skill handout. ?A:  1:1 with RN for support and encouragement.  Medications given as ordered.  PRN trazodone given with good relief.  Q 15 minute checks maintained for safety.  Encouraged participation in group and unit activities.   ?R:  She is currently resting with her eyes closed and appears to be asleep.  She remains safe on the unit.   ? ? 04/14/21 2151  ?Psych Admission Type (Psych Patients Only)  ?Admission Status Involuntary  ?Psychosocial Assessment  ?Patient Complaints Insomnia  ?Eye Contact Fair  ?Facial Expression Flat  ?Affect Sad;Flat  ?Speech Soft;Logical/coherent  ?Interaction Assertive  ?Motor Activity Slow  ?Appearance/Hygiene Unremarkable  ?Behavior Characteristics Cooperative;Appropriate to situation  ?Mood Depressed  ?Thought Process  ?Coherency WDL  ?Content WDL  ?Delusions WDL  ?Perception WDL  ?Hallucination None reported or observed  ?Judgment WDL  ?Confusion WDL  ?Danger to Self  ?Current suicidal ideation? Denies  ?Danger to Others  ?Danger to Others None reported or observed  ? ? ?

## 2021-04-15 NOTE — Progress Notes (Signed)
[facility] ? ?04/15/2021 3:46 PM ?Elizabeth DoveLadonna Mason  ?MRN:  [id number] ? ?Subjective: Elizabeth Mason reports, "So far, so good. It has been great being here. The group sessions are fun, a pick me up kind of thing. I'm learning to think positive & not give-in to all the bad feelings I have been having for a while". ? ?Reason for admission: 56 year old AA female. Patient is with no known hx of mental illness or treatments. [facility] with complaint of intentional drug overdose in an attempt to kill herself. However, chart review reports indicated while at the ED, Elizabeth Mason denies that this was a suicide attempt, rather, took the overdose to get her boyfriend to leave her alone because they were arguing at the time. After she took the overdose, her boyfriend call the ems & she was taken to the ED.  ? ?Daily notes: Elizabeth Mason is seen, chart reviewed. The chart findings discussed with the treatment team. She presents alert, oriented & aware of situation. She exchanges her 2 books for different ones today. She is tolerating the isolation precautions okay. No new progress with discharge planning or housing, other than she is pretty certain that she lost her apartment. She is more open to working with SW with regard to aftercare. She is considering rehab and follow up today. She enjoyed getting to go to the groups on the few days that they were available before the isolation precautions. Her sleep was broken due to discomfort with her armpit cyst. She is eating okay. No hallucinations, thoughts of harm to self or others. No further withdrawal or cravings.  ? ?Principal Problem: MDD (major depressive disorder) ? ?Diagnosis: Principal Problem: ?  MDD (major depressive disorder) ?Active Problems: ?  Intentional overdose (HCC) ?  Tobacco use ?  Alcohol abuse ?  BMI less than 19,adult ? ?Total Time spent with patient: 15 minutes ? ?Past Psychiatric History: See H&P ? ?Past Medical  History: History reviewed. No pertinent past medical history.  ?Past Surgical History:  ?Procedure Laterality Date  ? ABDOMINAL HYSTERECTOMY    ? ?Family History: History reviewed. No pertinent family history. ? ?Family Psychiatric  History: See H&P ? ?Social History:  ?Social History  ? ?Substance and Sexual Activity  ?Alcohol Use Yes  ? Alcohol/week: 21.0 standard drinks  ? Types: 21 Cans of beer per week  ? Comment: occ beer  ?   ?Social History  ? ?Substance and Sexual Activity  ?Drug Use Not Currently  ? Frequency: 3.0 times per week  ? Types: Marijuana  ?  ?Social History  ? ?Socioeconomic History  ? Marital status: Married  ?  Spouse name: Not on file  ? Number of children: Not on file  ? Years of education: Not on file  ? Highest education level: Not on file  ?Occupational History  ? Not on file  ?Tobacco Use  ? Smoking status: Every Day  ?  Packs/day: 2.00  ?  Types: Cigarettes  ?  Start date: 11987  ? Smokeless tobacco: Never  ?Vaping Use  ? Vaping Use: Never used  ?Substance and Sexual Activity  ? Alcohol use: Yes  ?  Alcohol/week: 21.0 standard drinks  ?  Types: 21 Cans of beer per week  ?  Comment: occ beer  ? Drug use: Not Currently  ?  Frequency: 3.0 times per week  ?  Types: Marijuana  ? Sexual activity: Yes  ?  Birth  control/protection: None  ?Other Topics Concern  ? Not on file  ?Social History Narrative  ? Not on file  ? ?Social Determinants of Health  ? ?Financial Resource Strain: Not on file  ?Food Insecurity: Not on file  ?Transportation Needs: Not on file  ?Physical Activity: Not on file  ?Stress: Not on file  ?Social Connections: Not on file  ? ?Additional Social History:  ? ?Sleep: Good ? ?Appetite:  Good ? ?Current Medications: ?Current Facility-Administered Medications  ?Medication Dose Route Frequency Provider Last Rate Last Admin  ? acetaminophen (TYLENOL) tablet 650 mg  650 mg Oral Q6H PRN Bobbitt, Shalon E, NP   650 mg at 04/15/21 0819  ? feeding supplement (ENSURE ENLIVE / ENSURE PLUS)  liquid 237 mL  237 mL Oral BID BM Truitt Cruey, Shelbie Hutching, MD   237 mL at 04/15/21 1400  ? FLUoxetine (PROZAC) capsule 10 mg  10 mg Oral Daily Armandina Stammer I, NP   10 mg at 04/15/21 5749  ? hydrOXYzine (ATARAX) tablet 25 mg  25 mg Oral TID PRN Bobbitt, Shalon E, NP      ? OLANZapine zydis (ZYPREXA) disintegrating tablet 5 mg  5 mg Oral Q8H PRN Leevy-Johnson, Brooke A, NP      ? And  ? LORazepam (ATIVAN) tablet 1 mg  1 mg Oral PRN Leevy-Johnson, Lina Sar, NP      ? And  ? ziprasidone (GEODON) injection 20 mg  20 mg Intramuscular PRN Leevy-Johnson, Brooke A, NP      ? nicotine (NICODERM CQ - dosed in mg/24 hours) patch 14 mg  14 mg Transdermal Daily Lafreda Casebeer, Shelbie Hutching, MD   14 mg at 04/15/21 0819  ? sulfamethoxazole-trimethoprim (BACTRIM DS) 800-160 MG per tablet 1 tablet  1 tablet Oral Q12H Armandina Stammer I, NP   1 tablet at 04/15/21 3552  ? traZODone (DESYREL) tablet 50 mg  50 mg Oral QHS PRN Bobbitt, Shalon E, NP   50 mg at 04/14/21 2151  ? ?Lab Results:  ?Results for orders placed or performed during the hospital encounter of 04/12/21 (from the past 48 hour(s))  ?Hemoglobin A1c     Status: None  ? Collection Time: 04/13/21  6:32 PM  ?Result Value Ref Range  ? Hgb A1c MFr Bld 5.2 4.8 - 5.6 %  ?  Comment: (NOTE) ?Pre diabetes:          5.7%-6.4% ? ?Diabetes:              >6.4% ? ?Glycemic control for   <7.0% ?adults with diabetes ?  ? Mean Plasma Glucose 102.54 mg/dL  ?  Comment: Performed at [facility], [address], [place], [place] [address]  ?Urinalysis, Routine w reflex microscopic Urine, Clean Catch     Status: Abnormal  ? Collection Time: 04/13/21  6:58 PM  ?Result Value Ref Range  ? Color, Urine YELLOW YELLOW  ? APPearance CLEAR CLEAR  ? Specific Gravity, Urine 1.010 1.005 - 1.030  ? pH 7.0 5.0 - 8.0  ? Glucose, UA NEGATIVE NEGATIVE mg/dL  ? Hgb urine dipstick LARGE (A) NEGATIVE  ? Bilirubin Urine NEGATIVE NEGATIVE  ? Ketones, ur NEGATIVE NEGATIVE mg/dL  ? Protein, ur NEGATIVE NEGATIVE mg/dL  ?  Nitrite NEGATIVE NEGATIVE  ? Leukocytes,Ua LARGE (A) NEGATIVE  ?  Comment: Performed at [facility], [address] [place], [place] [address]  ?Urinalysis, Microscopic (reflex)     Status: Abnormal  ? Collection Time: 04/13/21  6:58 PM  ?  Result Value Ref Range  ? RBC / HPF 0-5 0 - 5 RBC/hpf  ? WBC, UA 6-10 0 - 5 WBC/hpf  ? Bacteria, UA FEW (A) NONE SEEN  ? Squamous Epithelial / LPF NONE SEEN 0 - 5  ?  Comment: Performed at [facility], [address] [place], [place] [address]  ? ?Blood Alcohol level:  ?Lab Results  ?Component Value Date  ? ETH 110 (H) 04/10/2021  ? ETH 100 (H) 08/31/2017  ? ?Metabolic Disorder Labs: ?Lab Results  ?Component Value Date  ? HGBA1C 5.2 04/13/2021  ? MPG 102.54 04/13/2021  ? ?No results found for: PROLACTIN ?No results found for: CHOL, TRIG, HDL, CHOLHDL, VLDL, LDLCALC ? ?Physical Findings: ?AIMS: Facial and Oral Movements ?Muscles of Facial Expression: None, normal ?Lips and Perioral Area: None, normal ?Jaw: None, normal ?Tongue: None, normal,Extremity Movements ?Upper (arms, wrists, hands, fingers): None, normal ?Lower (legs, knees, ankles, toes): None, normal, Trunk Movements ?Neck, shoulders, hips: None, normal, Overall Severity ?Severity of abnormal movements (highest score from questions above): None, normal ?Incapacitation due to abnormal movements: None, normal ?Patient's awareness of abnormal movements (rate only patient's report): No Awareness, Dental Status ?Current problems with teeth and/or dentures?: No ?Does patient usually wear dentures?: No  ?CIWA:    ?COWS:    ? ?Musculoskeletal: ?Strength & Muscle Tone: within normal limits ?Gait & Station: normal ?Patient leans: N/A ? ?Psychiatric Specialty Exam: ? ?Presentation  ?General Appearance: Appropriate for Environment ? ?Eye Contact:Fair ? ?Speech:Normal Rate ? ?Speech Volume:Normal ? ?Handedness:Right ? ?Mood and Affect  ?Mood:Euthymic ? ?Affect:Constricted ? ?Thought Process   ?Thought Processes:Linear ? ?Descriptions of Associations:Intact ? ?Orientation:Full (Time, Place and Person) ? ?Thought Content:Logical ? ?History of Schizophrenia/Schizoaffective disorder:No data recorded ?

## 2021-04-15 NOTE — Progress Notes (Addendum)
Pt denies SI/HI/AVH and verbally agrees to approach staff if these become apparent or before harming themselves/others. Rates depression 2.5/10. Rates anxiety 0/10. Rates pain 10/10.  Pt found out that her son that she has not seen or spoken with in a long time, was in town to see her. Pt was very happy and hopes to be able to see him during visitation soon. Some pt items described on pt belonging sheet were given to daughter per pt request. Scheduled medications administered to pt, per MD orders. RN provided support and encouragement to pt. Q15 min safety checks implemented and continued. Pt safe on the unit. RN will continue to monitor and intervene as needed.  ? 04/15/21 0819  ?Psych Admission Type (Psych Patients Only)  ?Admission Status Involuntary  ?Psychosocial Assessment  ?Patient Complaints Depression;Insomnia  ?Eye Contact Fair  ?Facial Expression Animated;Sad  ?Affect Sad  ?Speech Logical/coherent;Soft  ?Interaction Assertive  ?Motor Activity Slow  ?Appearance/Hygiene In hospital gown  ?Behavior Characteristics Cooperative;Appropriate to situation;Calm  ?Mood Depressed;Pleasant  ?Thought Process  ?Coherency WDL  ?Content WDL  ?Delusions None reported or observed  ?Perception WDL  ?Hallucination None reported or observed  ?Judgment WDL  ?Confusion None  ?Danger to Self  ?Current suicidal ideation? Denies  ?Danger to Others  ?Danger to Others None reported or observed  ? ? ?

## 2021-04-16 ENCOUNTER — Encounter (HOSPITAL_COMMUNITY): Payer: Self-pay | Admitting: Psychiatry

## 2021-04-16 DIAGNOSIS — F332 Major depressive disorder, recurrent severe without psychotic features: Principal | ICD-10-CM

## 2021-04-16 MED ORDER — TRAZODONE HCL 50 MG PO TABS: 50.0000 mg | ORAL_TABLET | Freq: Every evening | ORAL | 0 refills | Status: DC | PRN

## 2021-04-16 MED ORDER — FLUOXETINE HCL 10 MG PO CAPS: 10.0000 mg | ORAL_CAPSULE | Freq: Every day | ORAL | 0 refills | Status: DC

## 2021-04-16 MED ORDER — NICOTINE 14 MG/24HR TD PT24: 14.0000 mg | MEDICATED_PATCH | Freq: Every day | TRANSDERMAL | 0 refills | Status: DC

## 2021-04-16 MED ORDER — SULFAMETHOXAZOLE-TRIMETHOPRIM 800-160 MG PO TABS: 1.0000 | ORAL_TABLET | Freq: Two times a day (BID) | ORAL | 0 refills | Status: DC

## 2021-04-16 NOTE — [facility] Suicide Risk Assessment (Signed)
[facility] Discharge Suicide Risk Assessment ? ? ?Principal Problem: MDD (major depressive disorder) ?Discharge Diagnoses: Principal Problem: ?  MDD (major depressive disorder) ?Active Problems: ?  Intentional overdose (HCC) ?  Tobacco use ?  Alcohol abuse ?  BMI less than 19,adult ? ? ?Total Time spent with patient: 30 minutes ? ?56 year old AA female. Patient is with no known hx of mental illness or treatments. She is admitted to the [facility] from the [facility] with complaint of intentional drug overdose in an attempt to kill herself. However, chart review reports indicated while at the ED, Ammie denies that this was a suicide attempt, rather, took the overdose to get her boyfriend to leave her alone because they were arguing at the time. After she took the overdose, her boyfriend call the ems & she was taken to the ED.  ?  ?During the patient's hospitalization, patient had extensive initial psychiatric evaluation, and follow-up psychiatric evaluations every day. ?  ?Psychiatric diagnoses provided upon initial assessment:  ?MDD, severe, recurrent, without psychotic features  ?  Intentional overdose  ?  Tobacco use ?  Alcohol abuse ?  ?Patient's psychiatric medications were adjusted on admission: -start fluoxetine 10 mg once daily ?-start bactrim for UTI for 7 days (first dose on 04-14-21).  ?  ?Gradually, patient started adjusting to milieu.   ?Patient's care was discussed during the interdisciplinary team meeting every day during the hospitalization. ?  ?The patient denied having side effects to prescribed psychiatric medication. ?  ?The patient reports their target psychiatric symptoms of depression and suicidal thoughts, all responded well to the psychiatric medications, and the patient reports overall benefit other psychiatric hospitalization. Supportive psychotherapy was provided to the patient. The patient also participated in regular group therapy while admitted.  ?  ?Labs were reviewed with the  patient, and abnormal results were discussed with the patient. ?  ?The patient denied having suicidal thoughts more than 48 hours prior to discharge.  Patient denies having homicidal thoughts.  Patient denies having auditory hallucinations.  Patient denies any visual hallucinations.  Patient denies having paranoid thoughts. ?  ?The patient is able to verbalize their individual safety plan to this provider. ?  ?It is recommended to the patient to continue psychiatric medications as prescribed, after discharge from the hospital.   ?  ?It is recommended to the patient to follow up with your outpatient psychiatric provider and PCP. ?  ?Discussed with the patient, the impact of alcohol, drugs, tobacco have been there overall psychiatric and medical wellbeing, and total abstinence from substance use was recommended the patient. ?  ?On the day of discharge, the pt reports that mood is euthymic, sleep is better, appetite is better, concentration is better. Denies SI and HI. Reports anxiety is better. Denies s/e to current psych meds.  ?  ? ? ? ?Musculoskeletal: ?Strength & Muscle Tone: within normal limits ?Gait & Station: normal ?Patient leans: N/A ? ?Psychiatric Specialty Exam ? ?Presentation  ?General Appearance: Appropriate for Environment; Casual; Fairly Groomed ? ?Eye Contact:Good ? ?Speech:Clear and Coherent; Normal Rate ? ?Speech Volume:Normal ? ?Handedness:Right ? ? ?Mood and Affect  ?Mood:Euthymic ? ?Duration of Depression Symptoms: No data recorded ?Affect:Appropriate; Congruent; Full Range ? ? ?Thought Process  ?Thought Processes:Linear ? ?Descriptions of Associations:Intact ? ?Orientation:Full (Time, Place and Person) ? ?Thought Content:Logical ? ?History of Schizophrenia/Schizoaffective disorder:No data recorded ?Duration of Psychotic Symptoms:No data recorded ?Hallucinations:Hallucinations: None ? ?Ideas of Reference:None ? ?Suicidal Thoughts:Suicidal Thoughts: No ? ?Homicidal Thoughts:Homicidal Thoughts:  No ? ? ?Sensorium  ?Memory:Immediate Good; Recent Good; Remote Good ? ?Judgment:Good ? ?Insight:Good ? ? ?Executive Functions  ?Concentration:Good ? ?Attention Span:Good ? ?Recall:Good ? ?Fund of Knowledge:Good ? ?Language:Good ? ? ?Psychomotor Activity  ?Psychomotor Activity:Psychomotor Activity: Normal ? ? ?Assets  ?Assets:Leisure Time ? ? ?Sleep  ?Sleep:Sleep: Good ? ? ?Physical Exam: ?Physical Exam ?ROS ?Blood pressure 118/84, pulse (!) 106, temperature 97.6 ?F (36.4 ?C), resp. rate 17, height 5\' 2"  (1.575 m), weight 41.3 kg, SpO2 99 %. Body mass index is 16.64 kg/m?. ? ?Mental Status Per Nursing Assessment::   ?On Admission:  Self-harm thoughts ? ?Demographic factors:  Low socioeconomic status, Unemployed ?  ?Current Mental Status:  Self-harm thoughts ?  ?Loss Factors:  Decline in physical health, Financial problems / change in socioeconomic status ?  ?Historical Factors:  Impulsivity ?  ?Risk Reduction Factors:  Living with another person, especially a relative ?  ?Continued Clinical Symptoms:  ?MDD - mood is stable. Denies SI.  ? ?Cognitive Features That Contribute To Risk:  ?None   ? ?Suicide Risk:  ?Mild:  There are no identifiable suicide plans, no associated intent, mild dysphoria and related symptoms, good self-control (both objective and subjective assessment), few other risk factors, and identifiable protective factors, including available and accessible social support. ? ? Follow-up Information   ? ? [facility]. Go to.   ?Specialty: Behavioral Health ?Why: Please go to this provider for therapy and medication management services on Monday or Wednesday, at 7:30 am.  Services are provided on a first come, first served basis. ?Contact information: ?[address] ?[place] ?[phone] ? ?  ?  ? ? [phone], DO. Call.   ?Specialty: Family Medicine ?Why: Please follow up with this doctor for Primary Care Services.  This is the provider that you seen  while in the Emergency Department.  This doctor can also provide medication management services. ?Contact information: ?[address] ?[place] ?[phone] ? ? ?  ?  ? ?  ?  ? ?  ? ? ?Plan Of Care/Follow-up recommendations:  ? ?Activity: as tolerated ?  ?Diet: heart healthy ?  ?Other: ?-Follow-up with your outpatient psychiatric provider -instructions on appointment date, time, and address (location) are provided to you in discharge paperwork. ?  ?-Take your psychiatric medications as prescribed at discharge - instructions are provided to you in the discharge paperwork. ?Sample medication for 7 days provided at discharge.  ?  ?-Follow-up with outpatient primary care doctor and other specialists -for management of chronic medical disease. ?  ?-Testing: Follow-up with outpatient provider for abnormal lab results:  ?04-13-21: +UTI ?  ?-Recommend abstinence from alcohol, tobacco, and other illicit drug use at discharge.  ?  ?-If your psychiatric symptoms recur, worsen, or if you have side effects to your psychiatric medications, call your outpatient psychiatric provider, 911, [phone] or go to the nearest emergency department. ?  ?-If suicidal thoughts recur, call your outpatient psychiatric provider, 911, [phone] or go to the nearest emergency department. ?  ? ?06-27-1977, MD ?04/16/2021, 2:25 PM ?

## 2021-04-16 NOTE — Discharge Summary (Signed)
Physician Discharge Summary Note ? ?Patient:  Elizabeth Mason is an 56 y.o., female ?MRN:  [id number] ?DOB:  [birth year] ?Patient phone:  [phone] (home)  ?Patient address:   ?[address] ?[place] [address],  ? ?Total Time spent with patient: 20 minutes ? ?Date of Admission:  04/12/2021 ?Date of Discharge: 04-16-2021 ? ?Reason for Admission:   ? ?56 year old AA female. Patient is with no known hx of mental illness or treatments. She is admitted to the [facility] from the [facility] with complaint of intentional drug overdose in an attempt to kill herself. However, chart review reports indicated while at the ED, Sherby denies that this was a suicide attempt, rather, took the overdose to get her boyfriend to leave her alone because they were arguing at the time. After she took the overdose, her boyfriend call the ems & she was taken to the ED.  ? ? ? ?Principal Problem: MDD (major depressive disorder) ?Discharge Diagnoses: Principal Problem: ?  MDD (major depressive disorder) ?Active Problems: ?  Intentional overdose ([place]) ?  Tobacco use ?  Alcohol abuse ?  BMI less than 19,adult ? ? ?Past Psychiatric History: denies previous history of psychiatric diagnosis per H&P  ? ?Past Medical History: History reviewed. No pertinent past medical history.  ?Past Surgical History:  ?Procedure Laterality Date  ? ABDOMINAL HYSTERECTOMY    ? ?Family History: History reviewed. No pertinent family history. ? ?Family Psychiatric  History: denies ? ?Social History:  ?Social History  ? ?Substance and Sexual Activity  ?Alcohol Use Yes  ? Alcohol/week: 21.0 standard drinks  ? Types: 21 Cans of beer per week  ? Comment: occ beer  ?   ?Social History  ? ?Substance and Sexual Activity  ?Drug Use Not Currently  ? Frequency: 3.0 times per week  ? Types: Marijuana  ?  ?Social History  ? ?Socioeconomic History  ? Marital status: Married  ?  Spouse name: Not on file  ? Number of children: Not on file  ? Years of education:  Not on file  ? Highest education level: Not on file  ?Occupational History  ? Not on file  ?Tobacco Use  ? Smoking status: Every Day  ?  Packs/day: 2.00  ?  Types: Cigarettes  ?  Start date: 35  ? Smokeless tobacco: Never  ?Vaping Use  ? Vaping Use: Never used  ?Substance and Sexual Activity  ? Alcohol use: Yes  ?  Alcohol/week: 21.0 standard drinks  ?  Types: 21 Cans of beer per week  ?  Comment: occ beer  ? Drug use: Not Currently  ?  Frequency: 3.0 times per week  ?  Types: Marijuana  ? Sexual activity: Yes  ?  Birth control/protection: None  ?Other Topics Concern  ? Not on file  ?Social History Narrative  ? Not on file  ? ?Social Determinants of Health  ? ?Financial Resource Strain: Not on file  ?Food Insecurity: Not on file  ?Transportation Needs: Not on file  ?Physical Activity: Not on file  ?Stress: Not on file  ?Social Connections: Not on file  ? ? ?Hospital Course:   ? ?During the patient's hospitalization, patient had extensive initial psychiatric evaluation, and follow-up psychiatric evaluations every day. ? ?Psychiatric diagnoses provided upon initial assessment:  ?MDD, severe, recurrent, without psychotic features  ?  Intentional overdose  ?  Tobacco use ?  Alcohol abuse ? ?Patient's psychiatric medications were adjusted on admission: -start fluoxetine 10 mg once  daily ?-start bactrim for UTI for 7 days (first dose on 04-14-21).  ? ?Gradually, patient started adjusting to milieu.   ?Patient's care was discussed during the interdisciplinary team meeting every day during the hospitalization. ? ?The patient denied having side effects to prescribed psychiatric medication. ? ?The patient reports their target psychiatric symptoms of depression and suicidal thoughts, all responded well to the psychiatric medications, and the patient reports overall benefit other psychiatric hospitalization. Supportive psychotherapy was provided to the patient. The patient also participated in regular group therapy while  admitted.  ? ?Labs were reviewed with the patient, and abnormal results were discussed with the patient. ? ?The patient denied having suicidal thoughts more than 48 hours prior to discharge.  Patient denies having homicidal thoughts.  Patient denies having auditory hallucinations.  Patient denies any visual hallucinations.  Patient denies having paranoid thoughts. ? ?The patient is able to verbalize their individual safety plan to this provider. ? ?It is recommended to the patient to continue psychiatric medications as prescribed, after discharge from the hospital.   ? ?It is recommended to the patient to follow up with your outpatient psychiatric provider and PCP. ? ?Discussed with the patient, the impact of alcohol, drugs, tobacco have been there overall psychiatric and medical wellbeing, and total abstinence from substance use was recommended the patient. ? ?On the day of discharge, the pt reports that mood is euthymic, sleep is better, appetite is better, concentration is better. Denies SI and HI. Reports anxiety is better. Denies s/e to current psych meds.  ? ?Physical Findings: ?AIMS: Facial and Oral Movements ?Muscles of Facial Expression: None, normal ?Lips and Perioral Area: None, normal ?Jaw: None, normal ?Tongue: None, normal,Extremity Movements ?Upper (arms, wrists, hands, fingers): None, normal ?Lower (legs, knees, ankles, toes): None, normal, Trunk Movements ?Neck, shoulders, hips: None, normal, Overall Severity ?Severity of abnormal movements (highest score from questions above): None, normal ?Incapacitation due to abnormal movements: None, normal ?Patient's awareness of abnormal movements (rate only patient's report): No Awareness, Dental Status ?Current problems with teeth and/or dentures?: No ?Does patient usually wear dentures?: No  ?CIWA:    ?COWS:    ? ?Musculoskeletal: ?Strength & Muscle Tone: within normal limits ?Gait & Station: normal ?Patient leans: N/A ? ? ?Psychiatric Specialty  Exam: ? ?Presentation  ?General Appearance: Appropriate for Environment; Casual; Fairly Groomed ? ?Eye Contact:Good ? ?Speech:Clear and Coherent; Normal Rate ? ?Speech Volume:Normal ? ?Handedness:Right ? ? ?Mood and Affect  ?Mood:Euthymic ? ?Affect:Appropriate; Congruent; Full Range ? ? ?Thought Process  ?Thought Processes:Linear ? ?Descriptions of Associations:Intact ? ?Orientation:Full (Time, Place and Person) ? ?Thought Content:Logical ? ?History of Schizophrenia/Schizoaffective disorder:No data recorded ?Duration of Psychotic Symptoms:No data recorded ?Hallucinations:Hallucinations: None ? ?Ideas of Reference:None ? ?Suicidal Thoughts:Suicidal Thoughts: No ? ?Homicidal Thoughts:Homicidal Thoughts: No ? ? ?Sensorium  ?Memory:Immediate Good; Recent Good; Remote Good ? ?Judgment:Good ? ?Insight:Good ? ? ?Executive Functions  ?Concentration:Good ? ?Attention Span:Good ? ?Recall:Good ? ?Fund of [place] ? ?Language:Good ? ? ?Psychomotor Activity  ?Psychomotor Activity:Psychomotor Activity: Normal ? ? ?Assets  ?Assets:Leisure Time ? ? ?Sleep  ?Sleep:Sleep: Good ? ? ? ?Physical Exam: ?Physical Exam ?Vitals reviewed.  ?Constitutional:   ?   General: She is not in acute distress. ?   Appearance: She is not toxic-appearing.  ?Pulmonary:  ?   Effort: Pulmonary effort is normal.  ?Neurological:  ?   Mental Status: She is alert.  ?Psychiatric:     ?   Mood and Affect: Mood normal.     ?  Behavior: Behavior normal.     ?   Thought Content: Thought content normal.     ?   Judgment: Judgment normal.  ? ?Review of Systems  ?Constitutional:  Negative for chills and fever.  ?Cardiovascular:  Negative for chest pain and palpitations.  ?Psychiatric/Behavioral:  Negative for depression, hallucinations, memory loss and suicidal ideas. The patient is not nervous/anxious and does not have insomnia.   ? ?Blood pressure 118/84, pulse (!) 106, temperature 97.6 ?F (36.4 ?C), resp. rate 17, height 5\' 2"  (1.575 m), weight 41.3 kg, SpO2  99 %. Body mass index is 16.64 kg/m?. ? ? ?Social History  ? ?Tobacco Use  ?Smoking Status Every Day  ? Packs/day: 2.00  ? Types: Cigarettes  ? Start date: 41  ?Smokeless Tobacco Never  ? ?Tobacco Cessation:  A p

## 2021-04-16 NOTE — BHH Counselor (Signed)
CSW provided the Pt with a packet that contains information including shelter and housing resources, free and reduced [employer] food information, clothing resources, crisis center information, a [employer] card, and suicide prevention information.   

## 2021-04-16 NOTE — BHH Suicide Risk Assessment (Signed)
B[facility] INPATIENT:  Family/Significant Other Suicide Prevention Education ? ?Suicide Prevention Education:  ?Education Completed; [name] [phone] (Daughter) has been identified by the patient as the family member/significant other with whom the patient will be residing, and identified as the person(s) who will aid the patient in the event of a mental health crisis (suicidal ideations/suicide attempt).  With written consent from the patient, the family member/significant other has been provided the following suicide prevention education, prior to the and/or following the discharge of the patient. ? ?The suicide prevention education provided includes the following: ?Suicide risk factors ?Suicide prevention and interventions ?National Suicide Hotline telephone number ?[facility] assessment telephone number ?[facility] Emergency Assistance 911 ?[place] and/or Residential Mobile Crisis Unit telephone number ? ?Request made of family/significant other to: ?Remove weapons (e.g., guns, rifles, knives), all items previously/currently identified as safety concern.   ?Remove drugs/medications (over-the-counter, prescriptions, illicit drugs), all items previously/currently identified as a safety concern. ? ?The family member/significant other verbalizes understanding of the suicide prevention education information provided.  The family member/significant other agrees to remove the items of safety concern listed above. ? ?CSW spoke with Mrs. Scales who states that her mother and her mother's Steffanie Rainwater are on a fixed income.  She states that the entire apartment complex is facing eviction at this time but that they are no due to be completely evicted from the home for a couple more weeks.  She states that they are still in the court process at this time and she is helping them with this process.   She states that her mother has already moved her items into storage and her mother's Steffanie Rainwater has  applied to an apartment in [place].  She states that she has money sat aside to help her mother financially and lives a few houses down and can let her mother stay with her for a few nights as well if it is needed.  Mrs. Lennie Odor states that that she will continue to be an emotional support as well and will be helping her mother set up MyChart and get to all of her appointments.  She states that she would like her mother's Emergency Department doctor, Shelby Mattocks to be her primary care doctor.  She states that there are no firearms or weapons in her mother's home and that her mother's Steffanie Rainwater has taken all of the medications out of the homes as well as a safety precaution.  CSW completed SPE with Mrs. Scales.  ? ?Aram Beecham ?04/16/2021, 1:46 PM ?

## 2021-04-16 NOTE — BHH Counselor (Signed)
CSW spoke with the Pt about her discharge plans.  The Pt states that she is not sure at this time where she will live after discharge.  She states that her Celesta Gentile and daughter have moved everything into storage since they are being evicted in a few days.  She states that her Celesta Gentile is staying in the apartment until they padlock the door.  She states that her Celesta Gentile and daughter have called all the shelters with no success and that they are continuing to contact other housing resources at this time.  She states that she will let the CSW know when they have found somewhere to live.  The Pt reports that her Celesta Gentile has family members that live in [place], [place].  She states that they "may move down to that area to stay with one of his family members".  She states that she has no family members that she can call for assistance at this time.  She states that she is not interested in therapy or substance use services at this time but is agreeable to those resources after finding somewhere to live.  CSW stated that she would provide the Pt with resources and referrals after the Pt informs her of what area she is going to be moving too.   ?

## 2021-04-16 NOTE — Progress Notes (Signed)
Discharge note: RN met with pt and reviewed pt's discharge instructions. Pt verbalized understanding of discharge instructions and pt did not have any questions. RN reviewed and provided pt with a copy of SRA, AVS and Transition Record. RN returned pt's belongings to pt.  Samples were given to pt. Pt denied SI/HI/AVH and voiced no concerns. Pt was very excited to be leaving. Pt was appreciative of the care pt received at [facility]. Patient discharged to the lobby without incident.  ? 04/16/21 0806  ?Psych Admission Type (Psych Patients Only)  ?Admission Status Involuntary  ?Psychosocial Assessment  ?Patient Complaints Insomnia  ?Eye Contact Fair  ?Facial Expression Animated  ?Affect Appropriate to circumstance  ?Speech Logical/coherent  ?Interaction Assertive  ?Motor Activity Slow  ?Appearance/Hygiene Unremarkable  ?Behavior Characteristics Cooperative;Appropriate to situation;Calm  ?Mood Euthymic;Pleasant  ?Thought Process  ?Coherency WDL  ?Content WDL  ?Delusions None reported or observed  ?Perception WDL  ?Hallucination None reported or observed  ?Judgment Impaired  ?Confusion None  ?Danger to Self  ?Current suicidal ideation? Denies  ?Danger to Others  ?Danger to Others None reported or observed  ? ? ?

## 2021-04-16 NOTE — BHH Counselor (Signed)
CSW spoke with the Pt who states that she spoke with her daughter and she states that her daughter will let her stay with her for a few days while she helps her get into an apartment.  She state that her daughter is a Radio producer and will be using her discount to help her get into an apartment in the [place] area within the next few days.  She states that she would like to try therapy and psychiatry in the [place] area.  CSW will work on outpatient follow-up for the Pt and will complete SPE with the Pt's daughter to confirm her discharge plan. ?

## 2021-04-16 NOTE — Progress Notes (Signed)
?  [facility] Adult Case Management Discharge Plan : ? ?Will you be returning to the same living situation after discharge:  Yes,  Home  ?At discharge, do you have transportation home?: Yes,  Daughter  ?Do you have the ability to pay for your medications: Yes,  Family  ? ?Release of information consent forms completed and in the chart;  Patient's signature needed at discharge. ? ?Patient to Follow up at: ? Follow-up Information   ? ? [place]. Go to.   ?Specialty: Behavioral Health ?Why: Please go to this provider for therapy and medication management services on Monday or Wednesday, at 7:30 am.  Services are provided on a first come, first served basis. ?Contact information: ?[address] ?[place] [address] ?[phone] ? ?  ?  ? ? Wells Guiles, DO. Call.   ?Specialty: Family Medicine ?Why: Please follow up with this doctor for [place].  This is the provider that you seen while in the Emergency Department.  This doctor can also provide medication management services. ?Contact information: ?[place] [address] ?[phone] ? ? ?  ?  ? ?  ?  ? ?  ? ? ?Next level of care provider has access to [place] ? ?Safety Planning and Suicide Prevention discussed: Yes,  with patient and daughter  ? ?  ? ?Has patient been referred to the Quitline?: Patient refused referral ? ?Patient has been referred for addiction treatment: Pt. refused referral ? ?Darleen Crocker, LCSWA ?04/16/2021, 1:55 PM ?

## 2021-04-22 NOTE — Progress Notes (Deleted)
?  SUBJECTIVE:  ? ?CHIEF COMPLAINT / HPI:  ? ?Intentional overdose: Hospitalized on 04/10/21 for overdose of amlodipine and unknown diuretics (boyfriend's medications). Discharged on 3/28 from [facility]  ? ?Left axillary mass:  ? ?Alcohol and smoking:  ? ? ? ?PERTINENT  PMH / PSH: *** ? ?No past medical history on file. ? ?OBJECTIVE:  ?There were no vitals taken for this visit. ? ?General: NAD, pleasant, able to participate in exam ?Cardiac: RRR, no murmurs auscultated. ?Respiratory: CTAB, normal effort, no wheezes, rales or rhonchi ?Abdomen: soft, non-tender, non-distended, normoactive bowel sounds ?Extremities: warm and well perfused, no edema or cyanosis. ?Skin: warm and dry, no rashes noted ?Neuro: alert, no obvious focal deficits, speech normal ?Psych: Normal affect and mood ? ?ASSESSMENT/PLAN:  ?No problem-specific Assessment & Plan notes found for this encounter. ?  ?No orders of the defined types were placed in this encounter. ? ?No orders of the defined types were placed in this encounter. ? ?No follow-ups on file. ?Shelby Mattocks, DO ?04/22/2021, 3:57 PM ?PGY-***, [facility] ?{    This will disappear when note is signed, click to select method of visit    :1} ?

## 2021-04-23 ENCOUNTER — Ambulatory Visit: Payer: Self-pay | Admitting: Student

## 2021-05-04 ENCOUNTER — Emergency Department (HOSPITAL_COMMUNITY)
Admission: EM | Admit: 2021-05-04 | Discharge: 2021-05-06 | Disposition: A | Discharge status: Home / Self Care | Payer: Self-pay | Attending: Emergency Medicine | Admitting: Emergency Medicine

## 2021-05-04 DIAGNOSIS — Y900 Blood alcohol level of less than 20 mg/100 ml: Secondary | ICD-10-CM | POA: Insufficient documentation

## 2021-05-04 DIAGNOSIS — F329 Major depressive disorder, single episode, unspecified: Secondary | ICD-10-CM | POA: Insufficient documentation

## 2021-05-04 DIAGNOSIS — F1014 Alcohol abuse with alcohol-induced mood disorder: Secondary | ICD-10-CM | POA: Insufficient documentation

## 2021-05-04 DIAGNOSIS — R45851 Suicidal ideations: Secondary | ICD-10-CM | POA: Insufficient documentation

## 2021-05-04 NOTE — ED Triage Notes (Signed)
Pt arrived with [employer] c/o suicidal thoughts. States she just got out of a psych unit and needs more help. Pt states her plan is to cut her wrist.  ?

## 2021-05-05 LAB — COMPREHENSIVE METABOLIC PANEL
ALT: 29 U/L (ref 0–44)
AST: 27 U/L (ref 15–41)
Albumin: 3.4 g/dL — ABNORMAL LOW (ref 3.5–5.0)
Alkaline Phosphatase: 64 U/L (ref 38–126)
Anion gap: 8 (ref 5–15)
BUN: 16 mg/dL (ref 6–20)
CO2: 26 mmol/L (ref 22–32)
Calcium: 8.4 mg/dL — ABNORMAL LOW (ref 8.9–10.3)
Chloride: 104 mmol/L (ref 98–111)
Creatinine, Ser: 0.64 mg/dL (ref 0.44–1.00)
GFR, Estimated: >60 mL/min (ref >60)
Glucose, Bld: 97 mg/dL (ref 70–99)
Potassium: 4.2 mmol/L (ref 3.5–5.1)
Sodium: 138 mmol/L (ref 135–145)
Total Bilirubin: 0.6 mg/dL (ref 0.3–1.2)
Total Protein: 6.6 g/dL (ref 6.5–8.1)

## 2021-05-05 LAB — CBC WITH DIFFERENTIAL/PLATELET
Abs Immature Granulocytes: 0.01 10*3/uL (ref 0.00–0.07)
Basophils Absolute: 0 10*3/uL (ref 0.0–0.1)
Basophils Relative: 1 %
Eosinophils Absolute: 0 10*3/uL (ref 0.0–0.5)
Eosinophils Relative: 0 %
HCT: 37.7 % (ref 36.0–46.0)
Hemoglobin: 13 g/dL (ref 12.0–15.0)
Immature Granulocytes: 0 %
Lymphocytes Relative: 30 %
Lymphs Abs: 1.8 10*3/uL (ref 0.7–4.0)
MCH: 36 pg — ABNORMAL HIGH (ref 26.0–34.0)
MCHC: 34.5 g/dL (ref 30.0–36.0)
MCV: 104.4 fL — ABNORMAL HIGH (ref 80.0–100.0)
Monocytes Absolute: 0.5 10*3/uL (ref 0.1–1.0)
Monocytes Relative: 9 %
Neutro Abs: 3.6 10*3/uL (ref 1.7–7.7)
Neutrophils Relative %: 60 %
Platelets: 366 10*3/uL (ref 150–400)
RBC: 3.61 MIL/uL — ABNORMAL LOW (ref 3.87–5.11)
RDW: 12.3 % (ref 11.5–15.5)
WBC: 5.9 10*3/uL (ref 4.0–10.5)
nRBC: 0 % (ref 0.0–0.2)

## 2021-05-05 LAB — ACETAMINOPHEN LEVEL: Acetaminophen (Tylenol), Serum: <10 ug/mL — ABNORMAL LOW (ref 10–30)

## 2021-05-05 LAB — ETHANOL: Alcohol, Ethyl (B): <10 mg/dL (ref <10)

## 2021-05-05 LAB — MAGNESIUM: Magnesium: 2.2 mg/dL (ref 1.7–2.4)

## 2021-05-05 LAB — SALICYLATE LEVEL: Salicylate Lvl: <7 mg/dL — ABNORMAL LOW (ref 7.0–30.0)

## 2021-05-05 NOTE — BH Assessment (Signed)
TTS attempted to see pt @ 1205 RN reports cart is in use with another pt.  ?

## 2021-05-05 NOTE — ED Notes (Signed)
Patient transfered from hall 21 to bed 51 ?
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
## 2021-05-05 NOTE — ED Notes (Signed)
Patient on TTS 

## 2021-05-05 NOTE — ED Notes (Signed)
Pt's valuables in locker #14.  ?

## 2021-05-05 NOTE — ED Provider Notes (Signed)
?[facility] EMERGENCY DEPARTMENT ?Provider Note ? ? ?CSN: [id number] ?Arrival date & time: 05/04/21  2008 ? ?  ? ?History ? ?Chief Complaint  ?Patient presents with  ? Suicidal  ? ? ?Elizabeth Mason is a 56 y.o. female. ? ?HPI ?Patient presenting for suicidal ideation with plan to cut her wrist.  Medical history includes alcohol abuse, MDD, and a recent intentional overdose.  Following this overdose, she did undergo a psychiatric hospitalization.  She was discharged 3 weeks ago.  At time of recent discharge, she was prescribed Prozac and trazodone.  She stopped taking these 2 weeks ago.  She feels like the Prozac was giving her nightmares.  Yesterday, a friend came to visit her from out of town.  She states that the friend told her "some stuff".  This upset her and caused her to have recurrence of suicidal ideation.  Patient denies any recent illicit drug use.  She did drink 2 beers yesterday.  She denies any physical complaints. ?  ? ?Home Medications ?Prior to Admission medications   ?Medication Sig Start Date End Date Taking? Authorizing Provider  ?traZODone (DESYREL) 50 MG tablet Take 1 tablet (50 mg total) by mouth at bedtime as needed for sleep. 04/16/21  Yes Massengill, Harrold Donath, MD  ?FLUoxetine (PROZAC) 10 MG capsule Take 1 capsule (10 mg total) by mouth daily. ?Patient not taking: Reported on 05/05/2021 04/17/21 05/17/21  Phineas Inches, MD  ?nicotine (NICODERM CQ - DOSED IN MG/24 HOURS) 14 mg/24hr patch Place 1 patch (14 mg total) onto the skin daily. ?Patient not taking: Reported on 05/05/2021 04/17/21   Phineas Inches, MD  ?sulfamethoxazole-trimethoprim (BACTRIM DS) 800-160 MG tablet Take 1 tablet by mouth every 12 (twelve) hours. ?Patient not taking: Reported on 05/05/2021 04/16/21   Phineas Inches, MD  ?   ? ?Allergies    ?Patient has no known allergies.   ? ?Review of Systems   ?Review of Systems  ?Psychiatric/Behavioral:  Positive for dysphoric mood and suicidal ideas.   ?All other  systems reviewed and are negative. ? ?Physical Exam ?Updated Vital Signs ?BP 126/87   Pulse 100   Temp 98.6 ?F (37 ?C)   Resp 18   SpO2 100%  ?Physical Exam ?Vitals and nursing note reviewed.  ?Constitutional:   ?   General: She is not in acute distress. ?   Appearance: Normal appearance. She is well-developed. She is not ill-appearing, toxic-appearing or diaphoretic.  ?HENT:  ?   Head: Normocephalic and atraumatic.  ?   Right Ear: External ear normal.  ?   Left Ear: External ear normal.  ?   Nose: Nose normal.  ?   Mouth/Throat:  ?   Mouth: Mucous membranes are moist.  ?   Pharynx: Oropharynx is clear.  ?Eyes:  ?   Extraocular Movements: Extraocular movements intact.  ?   Conjunctiva/sclera: Conjunctivae normal.  ?Cardiovascular:  ?   Rate and Rhythm: Normal rate and regular rhythm.  ?   Heart sounds: No murmur heard. ?Pulmonary:  ?   Effort: Pulmonary effort is normal. No respiratory distress.  ?Abdominal:  ?   General: There is no distension.  ?   Palpations: Abdomen is soft.  ?Musculoskeletal:     ?   General: No swelling. Normal range of motion.  ?   Cervical back: Normal range of motion and neck supple. No rigidity.  ?Skin: ?   General: Skin is warm and dry.  ?   Capillary Refill: Capillary refill takes less than  2 seconds.  ?   Coloration: Skin is not jaundiced or pale.  ?Neurological:  ?   General: No focal deficit present.  ?   Mental Status: She is alert and oriented to person, place, and time.  ?   Cranial Nerves: No cranial nerve deficit.  ?   Sensory: No sensory deficit.  ?   Motor: No weakness.  ?   Coordination: Coordination normal.  ?Psychiatric:     ?   Attention and Perception: Perception normal.     ?   Mood and Affect: Mood and affect normal.     ?   Speech: Speech normal.     ?   Behavior: Behavior is cooperative.     ?   Thought Content: Thought content includes suicidal ideation. Thought content includes suicidal plan.  ? ? ?ED Results / Procedures / Treatments   ?Labs ?(all labs ordered  are listed, but only abnormal results are displayed) ?Labs Reviewed  ?COMPREHENSIVE METABOLIC PANEL - Abnormal; Notable for the following components:  ?    Result Value  ? Calcium 8.4 (*)   ? Albumin 3.4 (*)   ? All other components within normal limits  ?CBC WITH DIFFERENTIAL/PLATELET - Abnormal; Notable for the following components:  ? RBC 3.61 (*)   ? MCV 104.4 (*)   ? MCH 36.0 (*)   ? All other components within normal limits  ?ACETAMINOPHEN LEVEL - Abnormal; Notable for the following components:  ? Acetaminophen (Tylenol), Serum <10 (*)   ? All other components within normal limits  ?SALICYLATE LEVEL - Abnormal; Notable for the following components:  ? Salicylate Lvl <7.0 (*)   ? All other components within normal limits  ?RESP PANEL BY RT-PCR (FLU A&B, COVID) ARPGX2  ?ETHANOL  ?MAGNESIUM  ?RAPID URINE DRUG SCREEN, HOSP PERFORMED  ?URINALYSIS, ROUTINE W REFLEX MICROSCOPIC  ? ? ?EKG ?None ? ?Radiology ?No results found. ? ?Procedures ?Procedures  ? ? ?Medications Ordered in ED ?Medications - No data to display ? ?ED Course/ Medical Decision Making/ A&P ?  ?                        ?Medical Decision Making ?Amount and/or Complexity of Data Reviewed ?Labs: ordered. ? ? ?Patient presenting for suicidal ideation.  She has a history of the same and was hospitalized 3 weeks ago following an intentional overdose.  She was prescribed Prozac on discharge.  She discontinued use of this medication 2 weeks ago.  She states that she her mood was well controlled up until yesterday when a friend of her disclose some information that got her upset.  Patient then had recurrence of suicidal ideation and plan to cut her wrist.  For this reason, she presents to the emergency department.  She denies any recent physical symptoms.  On arrival, patient is well-appearing.  Physical exam is reassuring.  Basic laboratory work-up was initiated for medical clearance.  Lab work is unremarkable.  Patient endorses consumption of 2 beers  yesterday.  She denies any other alcohol or illicit drug use.  I do not feel that patient's current status secondary to substance use.  Her dysphoric mood and suicidal thoughts may be secondary to withdrawal from her discontinued Prozac.  Certainly psychosocial stressors are the main exacerbating factor.  Patient is currently medically cleared and TTS was consulted.  She remains in the emergency department voluntarily. ? ? ? ? ? ? ? ?Final Clinical Impression(s) / ED Diagnoses ?Final  diagnoses:  ?Suicidal ideation  ? ? ?Rx / DC Orders ?ED Discharge Orders   ? ? None  ? ?  ? ? ?  ?Gloris Manchesterixon, Niang Mitcheltree, MD ?05/05/21 1205 ? ?

## 2021-05-06 NOTE — ED Notes (Signed)
Dr.Dixon notified that pt is psych cleared. MD is coming to assess pt.  ?

## 2021-05-06 NOTE — ED Notes (Signed)
MD notified that pt is psych cleared. Waiting for dispo at this time.  ?

## 2021-05-06 NOTE — ED Notes (Signed)
MD is aware that pt is waiting for dispo. Psych cleared at this time.  ?

## 2021-05-06 NOTE — ED Notes (Signed)
Cleared by MD for discharge. Waiting for ride who is coming in 20 minutes per pt.  ?

## 2021-05-06 NOTE — ED Provider Notes (Signed)
Emergency Medicine Observation Re-evaluation Note ? ?Elizabeth Mason is a 56 y.o. female, seen on rounds today.  Pt initially presented to the ED for complaints of Suicidal ?Currently, the patient is sleeping. ? ?Physical Exam  ?BP 126/85 (BP Location: Left Arm)   Pulse 78   Temp 98 ?F (36.7 ?C) (Oral)   Resp 14   SpO2 99%  ?Physical Exam ?General: Sleeping, nondistressed ?Cardiac: Extremities well perfused ?Lungs: Breathing is even and unlabored ?Psych: Deferred ? ?ED Course / MDM  ?EKG:  ? ?I have reviewed the labs performed to date as well as medications administered while in observation.  Recent changes in the last 24 hours include patient presented to the ED yesterday for suicidal ideation.  She was evaluated by TTS who has psychiatrically cleared her and recommended her for outpatient services. ? ?Plan  ?Current plan is for outpatient psychiatric care. ? Elizabeth Mason is not under involuntary commitment. ? ? ?  ?Godfrey Pick, MD ?05/06/21 1146 ? ?

## 2021-09-18 ENCOUNTER — Other Ambulatory Visit: Payer: Self-pay

## 2021-09-18 ENCOUNTER — Emergency Department (HOSPITAL_COMMUNITY)
Admission: EM | Admit: 2021-09-18 | Discharge: 2021-09-18 | Disposition: A | Discharge status: Home / Self Care | Payer: Self-pay | Attending: Emergency Medicine | Admitting: Emergency Medicine

## 2021-09-18 ENCOUNTER — Emergency Department (HOSPITAL_COMMUNITY): Payer: Self-pay

## 2021-09-18 ENCOUNTER — Encounter (HOSPITAL_COMMUNITY): Payer: Self-pay

## 2021-09-18 DIAGNOSIS — R2232 Localized swelling, mass and lump, left upper limb: Secondary | ICD-10-CM

## 2021-09-18 NOTE — ED Provider Notes (Signed)
[facility]-EMERGENCY DEPT Provider Note   CSN: [id number] Arrival date & time: 09/18/21  0026     History  Chief Complaint  Patient presents with   Abscess    Elizabeth Mason is a 56 y.o. female.  HPI   Medical history including alcohol and tobacco dependency presents with complaints of a abscess on her left armpit, states she has had this for about 2 years time, but over the last couple months is gotten worse, the areas are more painful itchy, she states that she will occasional drainage and discharge from the area, she notes that she has been having worsening night sweats, denies worsening weight gain or weight loss, no family history of cancer, she has no other complaints.  I reviewed patient's chart she was seen back in March for suicidal attempt, during that evaluation they note that patient had a mass underneath her left axilla, ultrasound was obtained of the area shows enlarged lymph nodes concern for possible malignancy, it was recommended that she tissue sampling and a mammogram.    Home Medications Prior to Admission medications   Medication Sig Start Date End Date Taking? Authorizing Provider  FLUoxetine (PROZAC) 10 MG capsule Take 1 capsule (10 mg total) by mouth daily. Patient not taking: Reported on 05/05/2021 04/17/21 05/17/21  Phineas Inches, MD  nicotine (NICODERM CQ - DOSED IN MG/24 HOURS) 14 mg/24hr patch Place 1 patch (14 mg total) onto the skin daily. Patient not taking: Reported on 05/05/2021 04/17/21   Phineas Inches, MD  sulfamethoxazole-trimethoprim (BACTRIM DS) 800-160 MG tablet Take 1 tablet by mouth every 12 (twelve) hours. Patient not taking: Reported on 05/05/2021 04/16/21   Phineas Inches, MD  traZODone (DESYREL) 50 MG tablet Take 1 tablet (50 mg total) by mouth at bedtime as needed for sleep. Patient not taking: Reported on 09/18/2021 04/16/21   Phineas Inches, MD      Allergies    Patient has no known allergies.     Review of Systems   Review of Systems  Constitutional:  Negative for chills and fever.  Respiratory:  Negative for shortness of breath.   Cardiovascular:  Negative for chest pain.  Gastrointestinal:  Negative for abdominal pain.  Skin:  Positive for wound.  Neurological:  Negative for headaches.    Physical Exam Updated Vital Signs BP 121/82 (BP Location: Left Arm)   Pulse 98   Temp 98.3 F (36.8 C) (Oral)   Resp 18   Ht 5\' 2"  (1.575 m)   Wt 41.3 kg   SpO2 100%   BMI 16.64 kg/m  Physical Exam Vitals and nursing note reviewed.  Constitutional:      General: She is not in acute distress.    Appearance: She is not ill-appearing.  HENT:     Head: Normocephalic and atraumatic.     Nose: No congestion.  Eyes:     Conjunctiva/sclera: Conjunctivae normal.  Cardiovascular:     Rate and Rhythm: Normal rate and regular rhythm.     Pulses: Normal pulses.  Pulmonary:     Effort: Pulmonary effort is normal.  Musculoskeletal:     Comments: On my exam patient has palpable nodules which are smooth, not fixated, at the left upper chest around the second and third rib, she has a large nodule in either axilla, hemodynamically stable, no evidence of drainage or discharge no surrounding erythema or edema present.  She has isolated nodules noted on the back, each nodule is about the size of a marble.  Please see picture for full detail.  Skin:    General: Skin is warm and dry.  Neurological:     Mental Status: She is alert.  Psychiatric:        Mood and Affect: Mood normal.          ED Results / Procedures / Treatments   Labs (all labs ordered are listed, but only abnormal results are displayed) Labs Reviewed - No data to display  EKG None  Radiology CT Chest Wo Contrast  Result Date: 09/18/2021 CLINICAL DATA:  Soft tissue mass, chest, nondiagnostic x-ray/ultrasound. EXAM: CT CHEST WITHOUT CONTRAST TECHNIQUE: Multidetector CT imaging of the chest was performed following  the standard protocol without IV contrast. RADIATION DOSE REDUCTION: This exam was performed according to the departmental dose-optimization program which includes automated exposure control, adjustment of the mA and/or kV according to patient size and/or use of iterative reconstruction technique. COMPARISON:  04/11/2021 axillary ultrasound. FINDINGS: Cardiovascular: Normal heart size.  No pericardial effusion. Mediastinum/Nodes: Left axillary mass immediately beneath the skin surface measuring up to 3.6 cm in length, enlarged compared to prior ultrasound. There are at least 3 adjacent areas of nodular skin thickening extending medially as marked on axial source images. Smaller adjacent axillary lymph nodes. No intrathoracic adenopathy noted. Lungs/Pleura: There is no edema, consolidation, effusion, or pneumothorax. Upper Abdomen: Negative Musculoskeletal: No acute or aggressive finding. IMPRESSION: 1. 3.6 cm left axillary mass with adjacent skin nodules, again concerning for malignancy and progressed from maxillary ultrasound March 2023. Recommend referral for histologic sampling and diagnostic mammography. 2. No acute finding. Electronically Signed   By: Tiburcio Pea M.D.   On: 09/18/2021 05:24    Procedures Procedures    Medications Ordered in ED Medications - No data to display  ED Course/ Medical Decision Making/ A&P                           Medical Decision Making Amount and/or Complexity of Data Reviewed Radiology: ordered.   This patient presents to the ED for concern of abscess, this involves an extensive number of treatment options, and is a complaint that carries with it a high risk of complications and morbidity.  The differential diagnosis includes malignancy, abscess, cellulitis, lymphadenopathy    Additional history obtained:  Additional history obtained from N/A External records from outside source obtained and reviewed including previous hospitalization   Co  morbidities that complicate the patient evaluation  N/A  Social Determinants of Health:  Noncompliant    Lab Tests:  I Ordered, and personally interpreted labs.  The pertinent results include: N/A   Imaging Studies ordered:  I ordered imaging studies including CT chest I independently visualized and interpreted imaging which showed 3.5 cm mass, concern for possible malignancy I agree with the radiologist interpretation   Cardiac Monitoring:  The patient was maintained on a cardiac monitor.  I personally viewed and interpreted the cardiac monitored which showed an underlying rhythm of: N/A   Medicines ordered and prescription drug management:  I ordered medication including N/A I have reviewed the patients home medicines and have made adjustments as needed  Critical Interventions:  N/A   Reevaluation:  Presents with evaluation of abscess, on my exam not consistent with an abscess, concern more for possible malignancy, will obtain CT chest for further evaluation.  Patient was updated on imaging, had extensive conversation with her regarding these findings, explained that this is concerning for possible malignancy and this can  worsen and has potentially be life-threatening if not further evaluated, explained that she needs to follow-up with  general surgery so that they can obtain samples for further evaluation, she is in agreement with this plan is ready for discharge.    Consultations Obtained:  N/A    Test Considered:  N/A    Rule out Suspicion for abscess or cellulitis is very low at this time, there is no evidence of infection present my exam, presentation is also atypical etiology she has had this for greater than 2 years, and has had ultrasounds of the area concerning for malignancy.  I have low suspicion for systemic infection patient is nontoxic-appearing vital signs reassuring.    Dispostion and problem list  After consideration of the diagnostic  results and the patients response to treatment, I feel that the patent would benefit from discharge.  Left axilla mass-concern for possible malignancy, will provide amatory referral to both community health and wellness as well as general surgery, given strict return precautions.  Patient was also given a copy of the radiologist read of the CT scan.             Final Clinical Impression(s) / ED Diagnoses Final diagnoses:  Mass of left axilla    Rx / DC Orders ED Discharge Orders          Ordered    Ambulatory referral to General Surgery        09/18/21 0546              Carroll Sage, PA-C 09/18/21 0547    Dione Booze, MD 09/18/21 [phone]

## 2021-09-18 NOTE — ED Triage Notes (Addendum)
Pt BIB EMS with reports of left axilla abscesses x 2 years. Pt reports drinking tonight.

## 2021-09-18 NOTE — Discharge Instructions (Signed)
You have a mass under your left armpit, I am concerned for possible cancer, it is important that you follow-up with general surgery for further evaluation.  I have given you a copy of the CT scan for your review  Come back to the emergency department if you develop chest pain, shortness of breath, severe abdominal pain, uncontrolled nausea, vomiting, diarrhea.

## 2023-06-12 ENCOUNTER — Encounter (HOSPITAL_COMMUNITY): Payer: Self-pay

## 2023-06-12 ENCOUNTER — Emergency Department (HOSPITAL_COMMUNITY): Payer: MEDICAID

## 2023-06-12 ENCOUNTER — Other Ambulatory Visit: Payer: Self-pay

## 2023-06-12 ENCOUNTER — Inpatient Hospital Stay (HOSPITAL_COMMUNITY)
Admission: EM | Admit: 2023-06-12 | Discharge: 2023-06-18 | DRG: 853 | Disposition: A | Discharge status: Home / Self Care | Payer: MEDICAID | Attending: Family Medicine | Admitting: Family Medicine

## 2023-06-12 DIAGNOSIS — Z556 Problems related to health literacy: Secondary | ICD-10-CM | POA: Insufficient documentation

## 2023-06-12 DIAGNOSIS — R6510 Systemic inflammatory response syndrome (SIRS) of non-infectious origin without acute organ dysfunction: Secondary | ICD-10-CM | POA: Diagnosis present

## 2023-06-12 DIAGNOSIS — Z90710 Acquired absence of both cervix and uterus: Secondary | ICD-10-CM

## 2023-06-12 DIAGNOSIS — E559 Vitamin D deficiency, unspecified: Secondary | ICD-10-CM | POA: Diagnosis present

## 2023-06-12 DIAGNOSIS — Z681 Body mass index (BMI) 19 or less, adult: Secondary | ICD-10-CM

## 2023-06-12 DIAGNOSIS — A419 Sepsis, unspecified organism: Principal | ICD-10-CM

## 2023-06-12 DIAGNOSIS — Z79899 Other long term (current) drug therapy: Secondary | ICD-10-CM

## 2023-06-12 DIAGNOSIS — Z1152 Encounter for screening for COVID-19: Secondary | ICD-10-CM

## 2023-06-12 DIAGNOSIS — Z23 Encounter for immunization: Secondary | ICD-10-CM

## 2023-06-12 DIAGNOSIS — R2233 Localized swelling, mass and lump, upper limb, bilateral: Secondary | ICD-10-CM | POA: Diagnosis present

## 2023-06-12 DIAGNOSIS — C50412 Malignant neoplasm of upper-outer quadrant of left female breast: Secondary | ICD-10-CM | POA: Diagnosis present

## 2023-06-12 DIAGNOSIS — N6321 Unspecified lump in the left breast, upper outer quadrant: Secondary | ICD-10-CM

## 2023-06-12 DIAGNOSIS — S41102A Unspecified open wound of left upper arm, initial encounter: Secondary | ICD-10-CM

## 2023-06-12 DIAGNOSIS — C773 Secondary and unspecified malignant neoplasm of axilla and upper limb lymph nodes: Secondary | ICD-10-CM | POA: Diagnosis present

## 2023-06-12 DIAGNOSIS — F1010 Alcohol abuse, uncomplicated: Secondary | ICD-10-CM

## 2023-06-12 DIAGNOSIS — R54 Age-related physical debility: Secondary | ICD-10-CM | POA: Diagnosis present

## 2023-06-12 DIAGNOSIS — N1330 Unspecified hydronephrosis: Secondary | ICD-10-CM | POA: Diagnosis present

## 2023-06-12 DIAGNOSIS — Z91199 Patient's noncompliance with other medical treatment and regimen due to unspecified reason: Secondary | ICD-10-CM

## 2023-06-12 DIAGNOSIS — F17210 Nicotine dependence, cigarettes, uncomplicated: Secondary | ICD-10-CM | POA: Diagnosis present

## 2023-06-12 DIAGNOSIS — Z833 Family history of diabetes mellitus: Secondary | ICD-10-CM

## 2023-06-12 DIAGNOSIS — F329 Major depressive disorder, single episode, unspecified: Secondary | ICD-10-CM | POA: Diagnosis present

## 2023-06-12 DIAGNOSIS — Z8249 Family history of ischemic heart disease and other diseases of the circulatory system: Secondary | ICD-10-CM

## 2023-06-12 DIAGNOSIS — J9601 Acute respiratory failure with hypoxia: Secondary | ICD-10-CM | POA: Diagnosis present

## 2023-06-12 DIAGNOSIS — F1090 Alcohol use, unspecified, uncomplicated: Secondary | ICD-10-CM | POA: Diagnosis present

## 2023-06-12 DIAGNOSIS — E876 Hypokalemia: Secondary | ICD-10-CM | POA: Diagnosis present

## 2023-06-12 DIAGNOSIS — C7951 Secondary malignant neoplasm of bone: Secondary | ICD-10-CM | POA: Diagnosis present

## 2023-06-12 DIAGNOSIS — R636 Underweight: Secondary | ICD-10-CM | POA: Diagnosis present

## 2023-06-12 DIAGNOSIS — C50919 Malignant neoplasm of unspecified site of unspecified female breast: Secondary | ICD-10-CM | POA: Diagnosis present

## 2023-06-12 DIAGNOSIS — N136 Pyonephrosis: Secondary | ICD-10-CM | POA: Diagnosis present

## 2023-06-12 LAB — COMPREHENSIVE METABOLIC PANEL WITH GFR
ALT: 11 U/L (ref 0–44)
AST: 17 U/L (ref 15–41)
Albumin: 2.9 g/dL — ABNORMAL LOW (ref 3.5–5.0)
Alkaline Phosphatase: 172 U/L — ABNORMAL HIGH (ref 38–126)
Anion gap: 13 (ref 5–15)
BUN: 7 mg/dL (ref 6–20)
CO2: 20 mmol/L — ABNORMAL LOW (ref 22–32)
Calcium: 8.5 mg/dL — ABNORMAL LOW (ref 8.9–10.3)
Chloride: 102 mmol/L (ref 98–111)
Creatinine, Ser: 0.67 mg/dL (ref 0.44–1.00)
GFR, Estimated: >60 mL/min (ref >60)
Glucose, Bld: 105 mg/dL — ABNORMAL HIGH (ref 70–99)
Potassium: 2.8 mmol/L — ABNORMAL LOW (ref 3.5–5.1)
Sodium: 135 mmol/L (ref 135–145)
Total Bilirubin: 0.6 mg/dL (ref 0.0–1.2)
Total Protein: 7.5 g/dL (ref 6.5–8.1)

## 2023-06-12 LAB — URINALYSIS, W/ REFLEX TO CULTURE (INFECTION SUSPECTED)
Bilirubin Urine: NEGATIVE
Glucose, UA: NEGATIVE mg/dL
Ketones, ur: NEGATIVE mg/dL
Nitrite: NEGATIVE
Protein, ur: NEGATIVE mg/dL
Specific Gravity, Urine: 1.003 — ABNORMAL LOW (ref 1.005–1.030)
pH: 7 (ref 5.0–8.0)

## 2023-06-12 LAB — CBC WITH DIFFERENTIAL/PLATELET
Abs Immature Granulocytes: 0.01 10*3/uL (ref 0.00–0.07)
Basophils Absolute: 0 10*3/uL (ref 0.0–0.1)
Basophils Relative: 0 %
Eosinophils Absolute: 0 10*3/uL (ref 0.0–0.5)
Eosinophils Relative: 0 %
HCT: 33.7 % — ABNORMAL LOW (ref 36.0–46.0)
Hemoglobin: 11.4 g/dL — ABNORMAL LOW (ref 12.0–15.0)
Immature Granulocytes: 0 %
Lymphocytes Relative: 8 %
Lymphs Abs: 0.4 10*3/uL — ABNORMAL LOW (ref 0.7–4.0)
MCH: 33.6 pg (ref 26.0–34.0)
MCHC: 33.8 g/dL (ref 30.0–36.0)
MCV: 99.4 fL (ref 80.0–100.0)
Monocytes Absolute: 0 10*3/uL — ABNORMAL LOW (ref 0.1–1.0)
Monocytes Relative: 1 %
Neutro Abs: 4.5 10*3/uL (ref 1.7–7.7)
Neutrophils Relative %: 91 %
Platelets: 402 10*3/uL — ABNORMAL HIGH (ref 150–400)
RBC: 3.39 MIL/uL — ABNORMAL LOW (ref 3.87–5.11)
RDW: 12.3 % (ref 11.5–15.5)
WBC: 5 10*3/uL (ref 4.0–10.5)
nRBC: 0 % (ref 0.0–0.2)

## 2023-06-12 LAB — PROTIME-INR
INR: 1 (ref 0.8–1.2)
Prothrombin Time: 13.5 s (ref 11.4–15.2)

## 2023-06-12 LAB — I-STAT CG4 LACTIC ACID, ED: Lactic Acid, Venous: 1.9 mmol/L (ref 0.5–1.9)

## 2023-06-12 MED ORDER — LORAZEPAM 1 MG PO TABS
0.0000 mg | ORAL_TABLET | Freq: Four times a day (QID) | ORAL | Status: DC
Start: 2023-06-12 — End: 2023-06-13

## 2023-06-12 MED ORDER — LACTATED RINGERS IV BOLUS (SEPSIS)
1000.0000 mL | Freq: Once | INTRAVENOUS | Status: AC
  Administered 2023-06-12: 1000 mL via INTRAVENOUS

## 2023-06-12 MED ORDER — LORAZEPAM 2 MG/ML IJ SOLN: 0.0000 mg | Freq: Four times a day (QID) | INTRAMUSCULAR | Status: DC

## 2023-06-12 MED ORDER — LORAZEPAM 2 MG/ML IJ SOLN: 0.0000 mg | Freq: Two times a day (BID) | INTRAMUSCULAR | Status: DC

## 2023-06-12 MED ORDER — THIAMINE HCL 100 MG/ML IJ SOLN
100.0000 mg | Freq: Every day | INTRAMUSCULAR | Status: DC
Start: 2023-06-13 — End: 2023-06-18
  Filled 2023-06-12: qty 2

## 2023-06-12 MED ORDER — LORAZEPAM 1 MG PO TABS: 0.0000 mg | ORAL_TABLET | Freq: Two times a day (BID) | ORAL | Status: DC

## 2023-06-12 MED ORDER — VANCOMYCIN HCL IN DEXTROSE 1-5 GM/200ML-% IV SOLN
1000.0000 mg | Freq: Once | INTRAVENOUS | Status: AC
Start: 2023-06-12 — End: 2023-06-13
  Administered 2023-06-13: 1000 mg via INTRAVENOUS
  Filled 2023-06-12: qty 200

## 2023-06-12 MED ORDER — SODIUM CHLORIDE 0.9 % IV SOLN
2.0000 g | Freq: Once | INTRAVENOUS | Status: AC
  Administered 2023-06-12: 2 g via INTRAVENOUS
  Filled 2023-06-12: qty 12.5

## 2023-06-12 MED ORDER — LACTATED RINGERS IV BOLUS (SEPSIS)
500.0000 mL | Freq: Once | INTRAVENOUS | Status: AC
  Administered 2023-06-12: 500 mL via INTRAVENOUS

## 2023-06-12 MED ORDER — LACTATED RINGERS IV SOLN: INTRAVENOUS | Status: DC

## 2023-06-12 MED ORDER — ACETAMINOPHEN 500 MG PO TABS
1000.0000 mg | ORAL_TABLET | Freq: Once | ORAL | Status: AC
Start: 2023-06-12 — End: 2023-06-12
  Administered 2023-06-12: 1000 mg via ORAL
  Filled 2023-06-12: qty 2

## 2023-06-12 MED ORDER — METRONIDAZOLE 500 MG/100ML IV SOLN
500.0000 mg | Freq: Once | INTRAVENOUS | Status: AC
  Administered 2023-06-12: 500 mg via INTRAVENOUS
  Filled 2023-06-12: qty 100

## 2023-06-12 MED ORDER — THIAMINE MONONITRATE 100 MG PO TABS
100.0000 mg | ORAL_TABLET | Freq: Every day | ORAL | Status: DC
  Administered 2023-06-13 – 2023-06-18 (×6): 100 mg via ORAL
  Filled 2023-06-12 (×6): qty 1

## 2023-06-12 NOTE — ED Triage Notes (Addendum)
 Pt came in via EMS from home w/ c/o of abd pain and lethargy for 4-5 days. Pt denies being in contact with anyone that is sick. Endorse nausea and vomiting. Denies diarrhea. Pt alert and oriented during triage. Pt does have open cyst in left arm pit. 4 beers on board.

## 2023-06-12 NOTE — Progress Notes (Signed)
 Pt being followed by [facility] for Sepsis protocol.

## 2023-06-12 NOTE — ED Provider Notes (Signed)
 [place] EMERGENCY DEPARTMENT AT [facility] Provider Note   CSN: [id number] Arrival date & time: 06/12/23  2217     History  Chief Complaint  Patient presents with   Code Sepsis    Elizabeth Mason is a 58 y.o. female.  HPI Patient reports that for about 5-7 nights she has been having shaking chills every night.  Has not been measuring her temperature to know if she had a fever.  She reports that she does drink beer daily.  She reports that because of that she seems to be vomiting small amounts mostly at night.  She reports emesis looks yellowish.  She reports sometimes she has abdominal pain but it comes and goes.  She denies any abdominal pain right now.  She denies diarrhea or constipation.  Patient denies pain burning urgency of urination.  Patient reports she has not been sexually active in years.  She does reports she is got a chronic wound that sometimes drains pus in her armpit.  She reports a long time ago she had some abscesses drained and ever since then she just has had ongoing scarring and draining.  Patient denies any history of IV drug abuse.  Denies any history of HIV.  Patient reports she drinks about 5 beers per day.  She reports last beer was about 40 minutes before coming into the emergency department.    Home Medications Prior to Admission medications   Medication Sig Start Date End Date Taking? Authorizing Provider  FLUoxetine  (PROZAC ) 10 MG capsule Take 1 capsule (10 mg total) by mouth daily. Patient not taking: Reported on 05/05/2021 04/17/21 05/17/21  Eleanore Grey, MD  nicotine  (NICODERM CQ  - DOSED IN MG/24 HOURS) 14 mg/24hr patch Place 1 patch (14 mg total) onto the skin daily. Patient not taking: Reported on 05/05/2021 04/17/21   Eleanore Grey, MD  sulfamethoxazole -trimethoprim  (BACTRIM  DS) 800-160 MG tablet Take 1 tablet by mouth every 12 (twelve) hours. Patient not taking: Reported on 05/05/2021 04/16/21   Eleanore Grey, MD  traZODone   (DESYREL ) 50 MG tablet Take 1 tablet (50 mg total) by mouth at bedtime as needed for sleep. Patient not taking: Reported on 09/18/2021 04/16/21   Eleanore Grey, MD      Allergies    Patient has no known allergies.    Review of Systems   Review of Systems  Physical Exam Updated Vital Signs BP (!) 159/77   Pulse (!) 140   Temp (!) 103.2 F (39.6 C) (Oral)   Resp (!) 22   SpO2 95%  Physical Exam Constitutional:      Comments: Patient is alert.  She is thin and mildly ill in appearance.  Mental status is clear.  No respiratory distress.  HENT:     Head: Normocephalic and atraumatic.     Mouth/Throat:     Comments: Poor dentition.  Posterior airway widely patent. Eyes:     Extraocular Movements: Extraocular movements intact.     Pupils: Pupils are equal, round, and reactive to light.  Cardiovascular:     Rate and Rhythm: Regular rhythm. Tachycardia present.  Pulmonary:     Effort: Pulmonary effort is normal.     Breath sounds: Normal breath sounds.  Abdominal:     General: There is no distension.     Palpations: Abdomen is soft.     Tenderness: There is no abdominal tenderness. There is no guarding.     Comments: Patient is abdomen is soft.  No guarding.  Nontender.  Musculoskeletal:  General: No swelling, tenderness or signs of injury. Normal range of motion.     Cervical back: Neck supple.     Right lower leg: No edema.     Left lower leg: No edema.     Comments: No peripheral edema.  Calves soft and pliable.  Skin:    General: Skin is warm and dry.  Neurological:     General: No focal deficit present.     Mental Status: She is oriented to person, place, and time.     Motor: No weakness.     Coordination: Coordination normal.     Comments: Patient appears mildly tremulous and twitchy.  Mental status is clear.  She shows no signs of confusion or delirium.  Movements are purposeful.  Intact use of 4 extremities.  Psychiatric:        Mood and Affect: Mood  normal.     ED Results / Procedures / Treatments   Labs (all labs ordered are listed, but only abnormal results are displayed) Labs Reviewed  CULTURE, BLOOD (ROUTINE X 2)  CULTURE, BLOOD (ROUTINE X 2)  RESP PANEL BY RT-PCR (RSV, FLU A&B, COVID)  RVPGX2  RESP PANEL BY RT-PCR (RSV, FLU A&B, COVID)  RVPGX2  COMPREHENSIVE METABOLIC PANEL WITH GFR  CBC WITH DIFFERENTIAL/PLATELET  PROTIME-INR  URINALYSIS, W/ REFLEX TO CULTURE (INFECTION SUSPECTED)  HIV ANTIBODY (ROUTINE TESTING W REFLEX)  HEPATITIS PANEL, ACUTE  ETHANOL  RAPID URINE DRUG SCREEN, HOSP PERFORMED  I-STAT CG4 LACTIC ACID, ED  I-STAT CG4 LACTIC ACID, ED    EKG EKG Interpretation Date/Time:  Friday Jun 12 2023 22:34:23 EDT Ventricular Rate:  142 PR Interval:  118 QRS Duration:  86 QT Interval:  233 QTC Calculation: 346 R Axis:   25  Text Interpretation: Sinus tachycardia Multiform ventricular premature complexes Probable anterior infarct, age indeterminate Abnormal T, probable ischemia, inferior leads Baseline wander in lead(s) V2 too much artifact Confirmed by Wynetta Heckle [phone]) on 06/12/2023 10:44:28 PM  Radiology No results found.  Procedures Procedures   CRITICAL CARE Performed by: Wynetta Heckle   Total critical care time: 35 minutes  Critical care time was exclusive of separately billable procedures and treating other patients.  Critical care was necessary to treat or prevent imminent or life-threatening deterioration.  Critical care was time spent personally by me on the following activities: development of treatment plan with patient and/or surrogate as well as nursing, discussions with consultants, evaluation of patient's response to treatment, examination of patient, obtaining history from patient or surrogate, ordering and performing treatments and interventions, ordering and review of laboratory studies, ordering and review of radiographic studies, pulse oximetry and re-evaluation of patient's  condition.  Medications Ordered in ED Medications  acetaminophen  (TYLENOL ) tablet 1,000 mg (has no administration in time range)  lactated ringers  infusion (has no administration in time range)  lactated ringers  bolus 1,000 mL (has no administration in time range)    And  lactated ringers  bolus 500 mL (has no administration in time range)  ceFEPIme (MAXIPIME) 2 g in sodium chloride  0.9 % 100 mL IVPB (has no administration in time range)  metroNIDAZOLE (FLAGYL) IVPB 500 mg (has no administration in time range)  vancomycin (VANCOCIN) IVPB 1000 mg/200 mL premix (has no administration in time range)  LORazepam  (ATIVAN ) injection 0-4 mg (has no administration in time range)    Or  LORazepam  (ATIVAN ) tablet 0-4 mg (has no administration in time range)  LORazepam  (ATIVAN ) injection 0-4 mg (has no administration in time range)  Or  LORazepam  (ATIVAN ) tablet 0-4 mg (has no administration in time range)  thiamine  (VITAMIN B1) tablet 100 mg (has no administration in time range)    Or  thiamine  (VITAMIN B1) injection 100 mg (has no administration in time range)    ED Course/ Medical Decision Making/ A&P                                 Medical Decision Making Amount and/or Complexity of Data Reviewed Labs: ordered. Radiology: ordered.  Risk Prescription drug management. Decision regarding hospitalization.   Patient reports she has had about 5 days of shaking chills that are occurring at night.  Patient describes history of chronic alcohol use with likely some complications including fairly frequent vomiting of small amounts of yellow vomitus.  She denies any hematemesis.  At this time she is not endorsing other significant positives on review of systems.  However on presentation patient has temperature of 103.2 and heart rate of 140.  Blood pressure is 159/77.  At this time patient meets sepsis criteria.  She does have a chronic appearing left axillary wound.  See attached images.  This may  be a soft tissue source.  However with history of alcoholism and emesis.  Consider intra-abdominal source 2.  Will start empiric antibiotics, fluid resuscitation and also obtain CT scanning. CIWA protocol ordered.  COVID influenza negative.  Lactic 1.9.  Urinalysis 6-10 WBCs many bacteria.  Urine drug screen negative.  Potassium 2.8.  GFR greater than 60.  Alk phos 172.  White count 5.0.  Hemoglobin 11.4.  INR 1.  Septic protocol used for fluid resuscitation and antibiotics.  Patient received 30 cc/kg lactated Ringer 's and vancomycin and cefepime.  Possible sources include a chronic wound in the left axilla.  There is no focal abscess present.  Patient has extensive scar tissue with a permanent drainage site.  Patient reports she has not been sexually active in a number of years.  Low probability for PID.  Urine does test mildly positive.  UTI consideration.  CT interpreted by radiology shows some concern for possible metastatic disease without an evident primary.  No apparent acute infectious source and CT.  CIWA Initiated for alcohol use.  Patient appears to have significant risk of possible withdrawal.  At this time patient has a clear mental status no signs of delirium.  She is tachycardic.  No hypotension.  Plan for admission to hospitalist service.        Final Clinical Impression(s) / ED Diagnoses Final diagnoses:  Sepsis, due to unspecified organism, unspecified whether acute organ dysfunction present [facility])  Alcohol abuse  Open wound of left axillary region with complication, initial encounter    Rx / DC Orders ED Discharge Orders     None         Wynetta Heckle, MD 06/13/23 0110

## 2023-06-13 ENCOUNTER — Observation Stay (HOSPITAL_COMMUNITY): Payer: MEDICAID

## 2023-06-13 ENCOUNTER — Encounter (HOSPITAL_COMMUNITY): Payer: Self-pay | Admitting: Family Medicine

## 2023-06-13 ENCOUNTER — Inpatient Hospital Stay (HOSPITAL_COMMUNITY): Payer: MEDICAID

## 2023-06-13 DIAGNOSIS — Z8249 Family history of ischemic heart disease and other diseases of the circulatory system: Secondary | ICD-10-CM | POA: Diagnosis not present

## 2023-06-13 DIAGNOSIS — Z1152 Encounter for screening for COVID-19: Secondary | ICD-10-CM | POA: Diagnosis not present

## 2023-06-13 DIAGNOSIS — F329 Major depressive disorder, single episode, unspecified: Secondary | ICD-10-CM | POA: Diagnosis present

## 2023-06-13 DIAGNOSIS — A419 Sepsis, unspecified organism: Secondary | ICD-10-CM

## 2023-06-13 DIAGNOSIS — Z79899 Other long term (current) drug therapy: Secondary | ICD-10-CM | POA: Diagnosis not present

## 2023-06-13 DIAGNOSIS — N136 Pyonephrosis: Secondary | ICD-10-CM | POA: Diagnosis present

## 2023-06-13 DIAGNOSIS — E876 Hypokalemia: Secondary | ICD-10-CM | POA: Diagnosis present

## 2023-06-13 DIAGNOSIS — E559 Vitamin D deficiency, unspecified: Secondary | ICD-10-CM | POA: Diagnosis present

## 2023-06-13 DIAGNOSIS — C50919 Malignant neoplasm of unspecified site of unspecified female breast: Secondary | ICD-10-CM | POA: Diagnosis present

## 2023-06-13 DIAGNOSIS — C7951 Secondary malignant neoplasm of bone: Secondary | ICD-10-CM | POA: Diagnosis present

## 2023-06-13 DIAGNOSIS — C50412 Malignant neoplasm of upper-outer quadrant of left female breast: Secondary | ICD-10-CM | POA: Diagnosis present

## 2023-06-13 DIAGNOSIS — Z23 Encounter for immunization: Secondary | ICD-10-CM | POA: Diagnosis not present

## 2023-06-13 DIAGNOSIS — Z833 Family history of diabetes mellitus: Secondary | ICD-10-CM | POA: Diagnosis not present

## 2023-06-13 DIAGNOSIS — N1330 Unspecified hydronephrosis: Secondary | ICD-10-CM | POA: Diagnosis present

## 2023-06-13 DIAGNOSIS — F17210 Nicotine dependence, cigarettes, uncomplicated: Secondary | ICD-10-CM | POA: Diagnosis present

## 2023-06-13 DIAGNOSIS — J9601 Acute respiratory failure with hypoxia: Secondary | ICD-10-CM | POA: Diagnosis present

## 2023-06-13 DIAGNOSIS — C773 Secondary and unspecified malignant neoplasm of axilla and upper limb lymph nodes: Secondary | ICD-10-CM | POA: Diagnosis present

## 2023-06-13 DIAGNOSIS — F1010 Alcohol abuse, uncomplicated: Secondary | ICD-10-CM

## 2023-06-13 DIAGNOSIS — Z91199 Patient's noncompliance with other medical treatment and regimen due to unspecified reason: Secondary | ICD-10-CM | POA: Diagnosis not present

## 2023-06-13 DIAGNOSIS — R54 Age-related physical debility: Secondary | ICD-10-CM | POA: Diagnosis present

## 2023-06-13 DIAGNOSIS — Z90710 Acquired absence of both cervix and uterus: Secondary | ICD-10-CM | POA: Diagnosis not present

## 2023-06-13 DIAGNOSIS — Z681 Body mass index (BMI) 19 or less, adult: Secondary | ICD-10-CM | POA: Diagnosis not present

## 2023-06-13 DIAGNOSIS — R636 Underweight: Secondary | ICD-10-CM | POA: Diagnosis present

## 2023-06-13 DIAGNOSIS — R6510 Systemic inflammatory response syndrome (SIRS) of non-infectious origin without acute organ dysfunction: Secondary | ICD-10-CM | POA: Diagnosis present

## 2023-06-13 DIAGNOSIS — S41102A Unspecified open wound of left upper arm, initial encounter: Secondary | ICD-10-CM

## 2023-06-13 LAB — BLOOD CULTURE ID PANEL (REFLEXED) - BCID2

## 2023-06-13 LAB — RAPID URINE DRUG SCREEN, HOSP PERFORMED
Amphetamines: NOT DETECTED
Barbiturates: NOT DETECTED
Benzodiazepines: NOT DETECTED
Cocaine: NOT DETECTED
Opiates: NOT DETECTED
Tetrahydrocannabinol: NOT DETECTED

## 2023-06-13 LAB — HIV ANTIBODY (ROUTINE TESTING W REFLEX): HIV Screen 4th Generation wRfx: NONREACTIVE

## 2023-06-13 LAB — BASIC METABOLIC PANEL WITH GFR
Anion gap: 9 (ref 5–15)
BUN: 8 mg/dL (ref 6–20)
CO2: 22 mmol/L (ref 22–32)
Calcium: 8.2 mg/dL — ABNORMAL LOW (ref 8.9–10.3)
Chloride: 104 mmol/L (ref 98–111)
Creatinine, Ser: 0.78 mg/dL (ref 0.44–1.00)
GFR, Estimated: >60 mL/min (ref >60)
Glucose, Bld: 114 mg/dL — ABNORMAL HIGH (ref 70–99)
Potassium: 3.9 mmol/L (ref 3.5–5.1)
Sodium: 135 mmol/L (ref 135–145)

## 2023-06-13 LAB — RESP PANEL BY RT-PCR (RSV, FLU A&B, COVID)  RVPGX2
Influenza A by PCR: NEGATIVE
Influenza B by PCR: NEGATIVE
Resp Syncytial Virus by PCR: NEGATIVE
SARS Coronavirus 2 by RT PCR: NEGATIVE

## 2023-06-13 LAB — MAGNESIUM: Magnesium: 2.2 mg/dL (ref 1.7–2.4)

## 2023-06-13 LAB — ETHANOL: Alcohol, Ethyl (B): <15 mg/dL (ref <15)

## 2023-06-13 LAB — C DIFFICILE QUICK SCREEN W PCR REFLEX
C Diff antigen: NEGATIVE
C Diff interpretation: NOT DETECTED
C Diff toxin: NEGATIVE

## 2023-06-13 LAB — HEPATITIS PANEL, ACUTE
HCV Ab: NONREACTIVE
Hep A IgM: NONREACTIVE
Hep B C IgM: NONREACTIVE
Hepatitis B Surface Ag: NONREACTIVE

## 2023-06-13 LAB — CBC
HCT: 33.7 % — ABNORMAL LOW (ref 36.0–46.0)
Hemoglobin: 10.9 g/dL — ABNORMAL LOW (ref 12.0–15.0)
MCH: 33.7 pg (ref 26.0–34.0)
MCHC: 32.3 g/dL (ref 30.0–36.0)
MCV: 104.3 fL — ABNORMAL HIGH (ref 80.0–100.0)
Platelets: 377 10*3/uL (ref 150–400)
RBC: 3.23 MIL/uL — ABNORMAL LOW (ref 3.87–5.11)
RDW: 12.5 % (ref 11.5–15.5)
WBC: 18.8 10*3/uL — ABNORMAL HIGH (ref 4.0–10.5)
nRBC: 0 % (ref 0.0–0.2)

## 2023-06-13 LAB — PROCALCITONIN: Procalcitonin: 18.87 ng/mL

## 2023-06-13 MED ORDER — HYDROMORPHONE HCL 1 MG/ML IJ SOLN: 0.5000 mg | INTRAMUSCULAR | Status: DC | PRN

## 2023-06-13 MED ORDER — POTASSIUM CHLORIDE 10 MEQ/100ML IV SOLN
10.0000 meq | INTRAVENOUS | Status: AC
  Administered 2023-06-13 (×2): 10 meq via INTRAVENOUS
  Filled 2023-06-13 (×2): qty 100

## 2023-06-13 MED ORDER — POTASSIUM CHLORIDE 20 MEQ PO PACK
40.0000 meq | PACK | Freq: Once | ORAL | Status: AC
  Administered 2023-06-13: 40 meq via ORAL
  Filled 2023-06-13: qty 2

## 2023-06-13 MED ORDER — LORAZEPAM 2 MG/ML IJ SOLN: 1.0000 mg | INTRAMUSCULAR | Status: AC | PRN

## 2023-06-13 MED ORDER — LORAZEPAM 2 MG/ML IJ SOLN
0.0000 mg | Freq: Three times a day (TID) | INTRAMUSCULAR | Status: DC
  Administered 2023-06-15: 2 mg via INTRAVENOUS
  Filled 2023-06-13: qty 1

## 2023-06-13 MED ORDER — SODIUM CHLORIDE 0.9% FLUSH
3.0000 mL | Freq: Two times a day (BID) | INTRAVENOUS | Status: DC
  Administered 2023-06-13 – 2023-06-18 (×11): 3 mL via INTRAVENOUS

## 2023-06-13 MED ORDER — POLYETHYLENE GLYCOL 3350 17 G PO PACK: 17.0000 g | PACK | Freq: Every day | ORAL | Status: DC | PRN

## 2023-06-13 MED ORDER — ENOXAPARIN SODIUM 30 MG/0.3ML IJ SOSY
30.0000 mg | PREFILLED_SYRINGE | INTRAMUSCULAR | Status: DC
  Administered 2023-06-13 – 2023-06-18 (×6): 30 mg via SUBCUTANEOUS
  Filled 2023-06-13 (×6): qty 0.3

## 2023-06-13 MED ORDER — PNEUMOCOCCAL 20-VAL CONJ VACC 0.5 ML IM SUSY
0.5000 mL | PREFILLED_SYRINGE | INTRAMUSCULAR | Status: AC
  Administered 2023-06-15: 0.5 mL via INTRAMUSCULAR
  Filled 2023-06-13: qty 0.5

## 2023-06-13 MED ORDER — SODIUM CHLORIDE 0.9 % IV SOLN
2.0000 g | Freq: Two times a day (BID) | INTRAVENOUS | Status: DC
  Administered 2023-06-13 – 2023-06-17 (×9): 2 g via INTRAVENOUS
  Filled 2023-06-13 (×9): qty 12.5

## 2023-06-13 MED ORDER — ADULT MULTIVITAMIN W/MINERALS CH
1.0000 | ORAL_TABLET | Freq: Every day | ORAL | Status: DC
  Administered 2023-06-13 – 2023-06-18 (×6): 1 via ORAL
  Filled 2023-06-13 (×6): qty 1

## 2023-06-13 MED ORDER — METRONIDAZOLE 500 MG/100ML IV SOLN
500.0000 mg | Freq: Two times a day (BID) | INTRAVENOUS | Status: DC
  Administered 2023-06-13: 500 mg via INTRAVENOUS
  Filled 2023-06-13: qty 100

## 2023-06-13 MED ORDER — ACETAMINOPHEN 325 MG PO TABS
650.0000 mg | ORAL_TABLET | Freq: Four times a day (QID) | ORAL | Status: DC | PRN
  Administered 2023-06-16: 650 mg via ORAL
  Filled 2023-06-13: qty 2

## 2023-06-13 MED ORDER — FOLIC ACID 1 MG PO TABS
1.0000 mg | ORAL_TABLET | Freq: Every day | ORAL | Status: DC
  Administered 2023-06-13 – 2023-06-18 (×6): 1 mg via ORAL
  Filled 2023-06-13 (×6): qty 1

## 2023-06-13 MED ORDER — IOHEXOL 300 MG/ML  SOLN
80.0000 mL | Freq: Once | INTRAMUSCULAR | Status: AC | PRN
  Administered 2023-06-13: 80 mL via INTRAVENOUS

## 2023-06-13 MED ORDER — MAGNESIUM SULFATE IN D5W 1-5 GM/100ML-% IV SOLN
1.0000 g | Freq: Once | INTRAVENOUS | Status: AC
  Administered 2023-06-13: 1 g via INTRAVENOUS
  Filled 2023-06-13: qty 100

## 2023-06-13 MED ORDER — ACETAMINOPHEN 650 MG RE SUPP: 650.0000 mg | Freq: Four times a day (QID) | RECTAL | Status: DC | PRN

## 2023-06-13 MED ORDER — LORAZEPAM 1 MG PO TABS: 1.0000 mg | ORAL_TABLET | ORAL | Status: AC | PRN

## 2023-06-13 MED ORDER — IOHEXOL 9 MG/ML PO SOLN
500.0000 mL | ORAL | Status: AC
  Administered 2023-06-13 (×2): 500 mL via ORAL

## 2023-06-13 MED ORDER — LORAZEPAM 2 MG/ML IJ SOLN
0.0000 mg | INTRAMUSCULAR | Status: DC
  Filled 2023-06-13: qty 1

## 2023-06-13 MED ORDER — ONDANSETRON HCL 4 MG/2ML IJ SOLN: 4.0000 mg | Freq: Four times a day (QID) | INTRAMUSCULAR | Status: DC | PRN

## 2023-06-13 MED ORDER — OXYCODONE HCL 5 MG PO TABS
5.0000 mg | ORAL_TABLET | ORAL | Status: DC | PRN
  Administered 2023-06-16 – 2023-06-18 (×2): 5 mg via ORAL
  Filled 2023-06-13 (×2): qty 1

## 2023-06-13 MED ORDER — ONDANSETRON HCL 4 MG PO TABS
4.0000 mg | ORAL_TABLET | Freq: Four times a day (QID) | ORAL | Status: DC | PRN
  Administered 2023-06-13: 4 mg via ORAL
  Filled 2023-06-13: qty 1

## 2023-06-13 MED ORDER — LACTATED RINGERS IV SOLN: INTRAVENOUS | Status: AC

## 2023-06-13 MED ORDER — VANCOMYCIN HCL IN DEXTROSE 1-5 GM/200ML-% IV SOLN
1000.0000 mg | INTRAVENOUS | Status: DC
  Administered 2023-06-14 – 2023-06-17 (×3): 1000 mg via INTRAVENOUS
  Filled 2023-06-13 (×3): qty 200

## 2023-06-13 NOTE — Progress Notes (Signed)
 Pharmacy Antibiotic Note  Elizabeth Mason is a 58 y.o. female admitted on 06/12/2023 with chronic appearing left axillary wound and sepsis.  Pharmacy has been consulted for vancomycin dosing.  1st dose given in the ED  Plan: Vancomycin 1gm IV q36h (AUC 488.7, Scr 0.8, TBW) Follow renal function, cultures and clinical course  Height: 5\' 2"  (157.5 cm) Weight: 41.3 kg (91 lb 0.8 oz) IBW/kg (Calculated) : 50.1  Temp (24hrs), Avg:103.2 F (39.6 C), Min:103.2 F (39.6 C), Max:103.2 F (39.6 C)  Recent Labs  Lab 06/12/23 2259 06/12/23 2308  WBC 5.0  --   CREATININE 0.67  --   LATICACIDVEN  --  1.9    Estimated Creatinine Clearance: 50 mL/min (by C-G formula based on SCr of 0.67 mg/dL).    No Known Allergies  Antimicrobials this admission: 5/23 cefepime >> 5/23 flagyl >> 5/24 vanc >>  Dose adjustments this admission:   Microbiology results: 5/23 BCx:  5/23 UCx:   Thank you for allowing pharmacy to be a part of this patient's care.  Beau Bound RPh 06/13/2023, 1:13 AM

## 2023-06-13 NOTE — H&P (Signed)
 History and Physical    [name] GEX:[id number] DOB: [birth year] DOA: 06/12/2023  PCP: Patient, No Pcp Per   Patient coming from: Home   Chief Complaint: Shaking chills, abdominal pain  HPI: Elizabeth Mason is a 58 y.o. female with medical history significant for depression and alcohol abuse who presents with shaking chills, intermittent abdominal pain, and intermittent nausea with vomiting.  Patient reports experiencing shaking chills for the past 5 days, wakes up drenched in sweat, and has had a loss of appetite.  She has also been experiencing pain in the mid abdomen which has been intermittent.  She reports occasional dysuria but not currently.  ED Course: Upon arrival to the ED, patient is found to be febrile to 39.6 C and saturating upper 90s on 3 L/min supplemental oxygen with elevated HR and stable BP.  Labs are most notable for potassium 2.8, normal creatinine, normal WBC, normal lactic acid, and negative respiratory virus panel.  Noncontrast CT demonstrates bilateral axillary soft tissue masses, sclerotic bone lesions, and edematous left kidney without radiopaque stone.  Blood cultures were collected in the ED and the patient was treated with 1.5 L LR, acetaminophen , vancomycin, cefepime, and Flagyl.  Review of Systems:  All other systems reviewed and apart from HPI, are negative.  History reviewed. No pertinent past medical history.  Past Surgical History:  Procedure Laterality Date   ABDOMINAL HYSTERECTOMY      Social History:   reports that she has been smoking cigarettes. She started smoking about 38 years ago. She has a 76.8 pack-year smoking history. She has never used smokeless tobacco. She reports current alcohol use of about 21.0 standard drinks of alcohol per week. She reports that she does not currently use drugs after having used the following drugs: Marijuana. Frequency: 3.00 times per week.  No Known Allergies  History reviewed. No pertinent family  history.   Prior to Admission medications   Medication Sig Start Date End Date Taking? Authorizing Provider  FLUoxetine  (PROZAC ) 10 MG capsule Take 1 capsule (10 mg total) by mouth daily. Patient not taking: Reported on 05/05/2021 04/17/21 05/17/21  Eleanore Grey, MD  nicotine  (NICODERM CQ  - DOSED IN MG/24 HOURS) 14 mg/24hr patch Place 1 patch (14 mg total) onto the skin daily. Patient not taking: Reported on 05/05/2021 04/17/21   Eleanore Grey, MD  sulfamethoxazole -trimethoprim  (BACTRIM  DS) 800-160 MG tablet Take 1 tablet by mouth every 12 (twelve) hours. Patient not taking: Reported on 05/05/2021 04/16/21   Eleanore Grey, MD  traZODone  (DESYREL ) 50 MG tablet Take 1 tablet (50 mg total) by mouth at bedtime as needed for sleep. Patient not taking: Reported on 09/18/2021 04/16/21   Eleanore Grey, MD    Physical Exam: Vitals:   06/12/23 2230 06/12/23 2320 06/13/23 0030 06/13/23 0045  BP: (!) 159/77 (!) 140/69 127/74   Pulse: (!) 140 (!) 126 (!) 116   Resp: (!) 22  20   Temp: (!) 103.2 F (39.6 C)     TempSrc: Oral     SpO2: 95%  98%   Weight:    41.3 kg  Height:    5\' 2"  (1.575 m)     Constitutional: NAD, frail-appearing   Eyes: PERTLA, lids and conjunctivae normal ENMT: Mucous membranes are moist. Posterior pharynx clear of any exudate or lesions.   Neck: supple, no masses  Respiratory: no wheezing, no crackles. No accessory muscle use.  Cardiovascular: S1 & S2 heard, regular rate and rhythm. No extremity edema.  Abdomen: No tenderness, soft. Bowel  sounds active.  Musculoskeletal: no clubbing / cyanosis. No joint deformity upper and lower extremities.   Skin: left axillary scarring and skin retraction with scant bloody discharge, minimal surrounding erythema. Skin otherwise warm, dry, well-perfused. Neurologic: CN 2-12 grossly intact. Moving all extremities. Alert and oriented.  Psychiatric: Calm. Cooperative.    Labs and Imaging on Admission: I have personally  reviewed following labs and imaging studies  CBC: Recent Labs  Lab 06/12/23 2259  WBC 5.0  NEUTROABS 4.5  HGB 11.4*  HCT 33.7*  MCV 99.4  PLT 402*   Basic Metabolic Panel: Recent Labs  Lab 06/12/23 2259  NA 135  K 2.8*  CL 102  CO2 20*  GLUCOSE 105*  BUN 7  CREATININE 0.67  CALCIUM 8.5*   GFR: Estimated Creatinine Clearance: 50 mL/min (by C-G formula based on SCr of 0.67 mg/dL). Liver Function Tests: Recent Labs  Lab 06/12/23 2259  AST 17  ALT 11  ALKPHOS 172*  BILITOT 0.6  PROT 7.5  ALBUMIN 2.9*   No results for input(s): "LIPASE", "AMYLASE" in the last 168 hours. No results for input(s): "AMMONIA" in the last 168 hours. Coagulation Profile: Recent Labs  Lab 06/12/23 2259  INR 1.0   Cardiac Enzymes: No results for input(s): "CKTOTAL", "CKMB", "CKMBINDEX", "TROPONINI" in the last 168 hours. BNP (last 3 results) No results for input(s): "PROBNP" in the last 8760 hours. HbA1C: No results for input(s): "HGBA1C" in the last 72 hours. CBG: No results for input(s): "GLUCAP" in the last 168 hours. Lipid Profile: No results for input(s): "CHOL", "HDL", "LDLCALC", "TRIG", "CHOLHDL", "LDLDIRECT" in the last 72 hours. Thyroid Function Tests: No results for input(s): "TSH", "T4TOTAL", "FREET4", "T3FREE", "THYROIDAB" in the last 72 hours. Anemia Panel: No results for input(s): "VITAMINB12", "FOLATE", "FERRITIN", "TIBC", "IRON", "RETICCTPCT" in the last 72 hours. Urine analysis:    Component Value Date/Time   COLORURINE YELLOW 06/12/2023 2302   APPEARANCEUR CLEAR 06/12/2023 2302   LABSPEC 1.003 (L) 06/12/2023 2302   PHURINE 7.0 06/12/2023 2302   GLUCOSEU NEGATIVE 06/12/2023 2302   HGBUR MODERATE (A) 06/12/2023 2302   BILIRUBINUR NEGATIVE 06/12/2023 2302   KETONESUR NEGATIVE 06/12/2023 2302   PROTEINUR NEGATIVE 06/12/2023 2302   UROBILINOGEN 1.0 10/24/2012 1651   NITRITE NEGATIVE 06/12/2023 2302   LEUKOCYTESUR LARGE (A) 06/12/2023 2302   Sepsis  Labs: @LABRCNTIP (procalcitonin:4,lacticidven:4) ) Recent Results (from the past 240 hours)  Resp panel by RT-PCR (RSV, Flu A&B, Covid) Anterior Nasal Swab     Status: None   Collection Time: 06/12/23 11:27 PM   Specimen: Anterior Nasal Swab  Result Value Ref Range Status   SARS Coronavirus 2 by RT PCR NEGATIVE NEGATIVE Final    Comment: (NOTE) SARS-CoV-2 target nucleic acids are NOT DETECTED.  The SARS-CoV-2 RNA is generally detectable in upper respiratory specimens during the acute phase of infection. The lowest concentration of SARS-CoV-2 viral copies this assay can detect is 138 copies/mL. A negative result does not preclude SARS-Cov-2 infection and should not be used as the sole basis for treatment or other patient management decisions. A negative result may occur with  improper specimen collection/handling, submission of specimen other than nasopharyngeal swab, presence of viral mutation(s) within the areas targeted by this assay, and inadequate number of viral copies(<138 copies/mL). A negative result must be combined with clinical observations, patient history, and epidemiological information. The expected result is Negative.  Fact Sheet for Patients:  BloggerCourse.com  Fact Sheet for Healthcare Providers:  SeriousBroker.it  This test is no t yet  approved or cleared by the [place]  FDA and  has been authorized for detection and/or diagnosis of SARS-CoV-2 by FDA under an Emergency Use Authorization (EUA). This EUA will remain  in effect (meaning this test can be used) for the duration of the COVID-19 declaration under Section 564(b)(1) of the Act, 21 U.S.C.section 360bbb-3(b)(1), unless the authorization is terminated  or revoked sooner.       Influenza A by PCR NEGATIVE NEGATIVE Final   Influenza B by PCR NEGATIVE NEGATIVE Final    Comment: (NOTE) The Xpert Xpress SARS-CoV-2/FLU/RSV plus assay is intended as an  aid in the diagnosis of influenza from Nasopharyngeal swab specimens and should not be used as a sole basis for treatment. Nasal washings and aspirates are unacceptable for Xpert Xpress SARS-CoV-2/FLU/RSV testing.  Fact Sheet for Patients: BloggerCourse.com  Fact Sheet for Healthcare Providers: SeriousBroker.it  This test is not yet approved or cleared by the [place]  FDA and has been authorized for detection and/or diagnosis of SARS-CoV-2 by FDA under an Emergency Use Authorization (EUA). This EUA will remain in effect (meaning this test can be used) for the duration of the COVID-19 declaration under Section 564(b)(1) of the Act, 21 U.S.C. section 360bbb-3(b)(1), unless the authorization is terminated or revoked.     Resp Syncytial Virus by PCR NEGATIVE NEGATIVE Final    Comment: (NOTE) Fact Sheet for Patients: BloggerCourse.com  Fact Sheet for Healthcare Providers: SeriousBroker.it  This test is not yet approved or cleared by the [place]  FDA and has been authorized for detection and/or diagnosis of SARS-CoV-2 by FDA under an Emergency Use Authorization (EUA). This EUA will remain in effect (meaning this test can be used) for the duration of the COVID-19 declaration under Section 564(b)(1) of the Act, 21 U.S.C. section 360bbb-3(b)(1), unless the authorization is terminated or revoked.  Performed at [facility], [address], [place], [place] [address]      Radiological Exams on Admission: CT CHEST ABDOMEN PELVIS WO CONTRAST Result Date: 06/13/2023 CLINICAL DATA:  Sepsis EXAM: CT CHEST, ABDOMEN AND PELVIS WITHOUT CONTRAST TECHNIQUE: Multidetector CT imaging of the chest, abdomen and pelvis was performed following the standard protocol without IV contrast. RADIATION DOSE REDUCTION: This exam was performed according to the departmental  dose-optimization program which includes automated exposure control, adjustment of the mA and/or kV according to patient size and/or use of iterative reconstruction technique. COMPARISON:  Same day chest radiograph; CT chest 09/18/2021 FINDINGS: CT CHEST FINDINGS Cardiovascular: No pericardial effusion. Normal caliber thoracic aorta. Mediastinum/Nodes: Trachea and esophagus are unremarkable. No thoracic adenopathy. Lungs/Pleura: No focal consolidation, pleural effusion, or pneumothorax. Musculoskeletal: Multiple sclerotic lesions throughout the thoracic vertebral bodies, sternum, and bilateral ribs. For example 12 mm lesion in the mid sternum (series 10/image 74) and 17 x 15 mm lesion in the T8 vertebral body. No pathologic fracture. Partially visualized soft tissue mass in the left axilla with skin retraction measuring approximately 4.0 x 2.3 cm in the axial plane (series 2/image 12). Additional focal soft tissue thickening in the right axilla on series 2/image 16 measuring 7 x 13 mm. These findings are similar to 09/18/2021. CT ABDOMEN PELVIS FINDINGS Hepatobiliary: Unremarkable noncontrast appearance of the liver, gallbladder, and biliary tree. Pancreas: Unremarkable. Spleen: Unremarkable. Adrenals/Urinary Tract: The adrenal glands are poorly visualized without IV contrast. Unremarkable right kidney. Mild right hydronephrosis. No obstructing stone. Unremarkable bladder. Stomach/Bowel: Normal caliber large and small bowel. No bowel wall thickening. Stomach and appendix are within normal limits. Vascular/Lymphatic: Aortic atherosclerosis. No  enlarged abdominal or pelvic lymph nodes. Reproductive: Hysterectomy.  No adnexal mass. Other: No free intraperitoneal fluid or air. Musculoskeletal: No acute fracture. Numerous sclerotic lesions are present throughout the axial and visualized appendicular skeleton. For example a 13 mm sclerotic lesion in the L3 vertebral body IMPRESSION: 1. Numerous sclerotic lesions  throughout the axial and visualized appendicular skeleton new compared 09/18/2021, suggestive of metastatic disease of unknown primary. 2. Edematous left kidney with left hydronephrosis new since 2023. No radiopaque stone. Given findings concerning for osseous metastases, CT abdomen pelvis with IV contrast and including a renal excretory phase is recommended for further evaluation. 3. Partially visualized soft tissue mass in the left axilla with skin retraction measuring approximately 4.0 x 2.3 cm in the axial plane. Additional focal soft tissue thickening in the right axilla measuring 7 x 13 mm. These findings are similar to 09/18/2021 differential considerations include infectious/inflammatory process such as hydradenitis suppurativa or malignancy. Clinical and if appropriate histologic correlation is recommended. 4. Aortic Atherosclerosis (ICD10-I70.0). Electronically Signed   By: Rozell Cornet M.D.   On: 06/13/2023 00:21   DG Chest Port 1 View Result Date: 06/12/2023 CLINICAL DATA:  Questionable sepsis-evaluate for abnormality EXAM: PORTABLE CHEST 1 VIEW COMPARISON:  Radiographs 04/10/2021 FINDINGS: Stable cardiomediastinal silhouette. No focal consolidation, pleural effusion, or pneumothorax. No displaced rib fractures. IMPRESSION: No active disease. Electronically Signed   By: Rozell Cornet M.D.   On: 06/12/2023 23:14    EKG: Independently reviewed. Sinus tachycardia, rate 142, PVCs.   Assessment/Plan   1. SIRS  - Febrile, tachycardic, and tachypneic on presentation  - Blood cultures collected and 30 cc/kg IVF bolus and antibiotics given in ED  - Left axillary lesion does not appear acutely inflamed; pyelonephritis a consideration - Continue empiric antibiotics, follow cultures and clinical course    2. Left hydronephrosis  - Left kidney is edematous on non-contrast CT; she reports intermittent dysuria  - Culture urine, check contrast-enhanced CT with renal excretory phase, continue  antibiotics    3. Axillary masses; sclerotic bone lesions  - Prior imaging of left axillary mass concerning for malignancy - Right axillary mass and sclerotic bone lesions noted on CT in ED, new from August 2023  - She will need biopsy    4. Hypokalemia  - Replacing   5. Alcohol abuse  - Monitor with CIWA, use Ativan  if needed, supplemental vitamins, consult TOC   6. ?Acute hypoxic respiratory failure - Patient is saturating mid-upper 90s on 3 Lpm supplemental O2 in ED  - She denies SOB, chest pain, hemoptysis, or leg swelling and no acute pulmonary findings noted on non-contrast CT  - Attempt to wean to room air, consider CTA chest if she is truly hypoxic    DVT prophylaxis: Lovenox   Code Status: Full  Level of Care: Level of care: Progressive Family Communication: none present  Disposition Plan:  Patient is from: home  Anticipated d/c is to: TBD Anticipated d/c date is: Possibly as early as 5/25 or 06/15/23 Patient currently: Pending CT abdomen/pelvis with contrast, cultures, clinical stability  Consults called: None  Admission status: Observation     Walton Guppy, MD [facility]  06/13/2023, 1:08 AM

## 2023-06-13 NOTE — Progress Notes (Signed)
 PHARMACY - PHYSICIAN COMMUNICATION CRITICAL VALUE ALERT - BLOOD CULTURE IDENTIFICATION (BCID)  Elizabeth Mason is an 58 y.o. female who presented to [facility] on 06/12/2023 with a chief complaint of sepsis, fever/chills, chronic axillary wound as possible source, pyelonephritis.   Assessment:  1/4 blood culture bottles with GPC.  BCID Staphylococcus species  Name of physician (or Provider) Contacted: Dr Thelma Fire  Current antibiotics: Cefepime, metronidazole, vancomycin   Changes to prescribed antibiotics recommended:  Recommendations accepted by provider D/C metronidazole, but continue Cefepime for now for UTI coverage, continue Vancomycin for Staph coverage.   Results for orders placed or performed during the hospital encounter of 06/12/23  Blood Culture ID Panel (Reflexed) (Collected: 06/12/2023 10:59 PM)  Result Value Ref Range   Enterococcus faecalis NOT DETECTED NOT DETECTED   Enterococcus Faecium NOT DETECTED NOT DETECTED   Listeria monocytogenes NOT DETECTED NOT DETECTED   Staphylococcus species DETECTED (A) NOT DETECTED   Staphylococcus aureus (BCID) NOT DETECTED NOT DETECTED   Staphylococcus epidermidis NOT DETECTED NOT DETECTED   Staphylococcus lugdunensis NOT DETECTED NOT DETECTED   Streptococcus species NOT DETECTED NOT DETECTED   Streptococcus agalactiae NOT DETECTED NOT DETECTED   Streptococcus pneumoniae NOT DETECTED NOT DETECTED   Streptococcus pyogenes NOT DETECTED NOT DETECTED   A.calcoaceticus-baumannii NOT DETECTED NOT DETECTED   Bacteroides fragilis NOT DETECTED NOT DETECTED   Enterobacterales NOT DETECTED NOT DETECTED   Enterobacter cloacae complex NOT DETECTED NOT DETECTED   Escherichia coli NOT DETECTED NOT DETECTED   Klebsiella aerogenes NOT DETECTED NOT DETECTED   Klebsiella oxytoca NOT DETECTED NOT DETECTED   Klebsiella pneumoniae NOT DETECTED NOT DETECTED   Proteus species NOT DETECTED NOT DETECTED   Salmonella species NOT DETECTED NOT DETECTED   Serratia  marcescens NOT DETECTED NOT DETECTED   Haemophilus influenzae NOT DETECTED NOT DETECTED   Neisseria meningitidis NOT DETECTED NOT DETECTED   Pseudomonas aeruginosa NOT DETECTED NOT DETECTED   Stenotrophomonas maltophilia NOT DETECTED NOT DETECTED   Candida albicans NOT DETECTED NOT DETECTED   Candida auris NOT DETECTED NOT DETECTED   Candida glabrata NOT DETECTED NOT DETECTED   Candida krusei NOT DETECTED NOT DETECTED   Candida parapsilosis NOT DETECTED NOT DETECTED   Candida tropicalis NOT DETECTED NOT DETECTED   Cryptococcus neoformans/gattii NOT DETECTED NOT DETECTED      Kendall Pauls PharmD, BCPS [facility] main pharmacy [phone] 06/13/2023 6:38 PM

## 2023-06-13 NOTE — Progress Notes (Signed)
 The CT Tech secured chat this RN to bring the patient down for CT. Patient was taken down to the Radiology and the doors were locked. The patient is just brought up to her room. Day shift will follow up with CT and get the scan down.

## 2023-06-13 NOTE — Progress Notes (Signed)
 [facility]                                                                              Elizabeth Mason, is a 58 y.o. female, DOB - October 30, 1965, ZOX:[id number] Admit date - 06/12/2023    Outpatient Primary MD for the patient is Patient, No Pcp Per  LOS - 0  days  Chief Complaint  Patient presents with   Code Sepsis       Brief summary   Patient is a 58 year old female with depression, alcohol abuse presented with shaking chills, intermittent abdominal pain with nausea, vomiting.  Patient reported shaking chills for the past 5 days prior to admission, loss of appetite, pain in the mid abdomen, intermittent, occasional dysuria. In ED, temp 103.2 F, RR 22-25, pulse 140, BP 159/77, O2 sats 95% on room air.  WBCs 18.8    Assessment & Plan    Principal Problem: Sepsis likely due to UTI, pyelonephritis, ?  Malignancy of unknown primary, POA - Patient met sepsis criteria on admission with fevers, tachypnea, tachycardia, leukocytosis, source unclear, possibly pyelonephritis - Has left axillary lesion, chest x-ray shows no focal solid lesion, UA positive for UTI - Continue IV cefepime, Vanco, Flagyl - Follow blood cultures, urine cultures   Active Problems: Left renal pyelonephritis with hydronephrosis - CT showed edematous left kidney with left hydronephrosis new since 2023, no stone  - Continue IV antibiotics, follow urine cultures and blood cultures - Repeat CT abdomen pelvis with IV contrast.  Pending results, will consult urology if needed  Mass of axilla, bilateral, sclerotic lesions, likely metastatic disease of unknown primary - Patient has a soft tissue mass in the left axilla 4.0 x 2.3 cm, additional soft tissue thickening in the right axilla 7 x 13 mm - IR consulted for biopsy, recommended ultrasound of the axilla. - Pending CT chest abdomen pelvis with contrast and biopsy, will consult oncology  Hypokalemia - Replace as needed    Alcohol  use -Currently stable, not in any acute withdrawals, -Continue CIWA with Ativan     MDD (major depressive disorder) -Currently stable  Acute hypoxic respiratory failure - No acute chest pain, shortness of breath, patient was placed on 3 L O2 via [place] in ED - Wean O2 as tolerated - Obtain CT chest with contrast, hopefully will be able to assess if patient has any PE -  flu, RSV, COVID-negative.  Chest x-ray negative for any pneumonia  Underweight Estimated body mass index is 16.65 kg/m as calculated from the following:   Height as of this encounter: 5\' 2"  (1.575 m).   Weight as of this encounter: 41.3 kg.  Code Status: Full code DVT Prophylaxis:  enoxaparin  (LOVENOX ) injection 30 mg Start: 06/13/23 1000   Level of Care: Level of care: Progressive Family Communication: Updated patient Disposition Plan:      Remains inpatient appropriate: Ongoing workup   Procedures:    Consultants:   Intervention radiology  Antimicrobials:   Anti-infectives (From admission, onward)    Start     Dose/Rate Route Frequency Ordered Stop   06/14/23 1400  vancomycin (VANCOCIN) IVPB 1000 mg/200 mL premix  1,000 mg 200 mL/hr over 60 Minutes Intravenous Every 36 hours 06/13/23 0216     06/13/23 1200  ceFEPIme  (MAXIPIME ) 2 g in sodium chloride  0.9 % 100 mL IVPB        2 g 200 mL/hr over 30 Minutes Intravenous Every 12 hours 06/13/23 0103     06/13/23 1100  metroNIDAZOLE  (FLAGYL ) IVPB 500 mg        500 mg 100 mL/hr over 60 Minutes Intravenous Every 12 hours 06/13/23 0053 06/20/23 1059   06/12/23 2300  ceFEPIme  (MAXIPIME ) 2 g in sodium chloride  0.9 % 100 mL IVPB        2 g 200 mL/hr over 30 Minutes Intravenous  Once 06/12/23 2259 06/12/23 2353   06/12/23 2300  metroNIDAZOLE  (FLAGYL ) IVPB 500 mg        500 mg 100 mL/hr over 60 Minutes Intravenous  Once 06/12/23 2259 06/13/23 0123   06/12/23 2300  vancomycin  (VANCOCIN ) IVPB 1000 mg/200 mL premix        1,000 mg 200 mL/hr over 60 Minutes  Intravenous  Once 06/12/23 2259 06/13/23 0701          Medications  enoxaparin  (LOVENOX ) injection  30 mg Subcutaneous Q24H   folic acid   1 mg Oral Daily   iohexol   500 mL Oral Q1H   LORazepam   0-4 mg Intravenous Q4H   Followed by   [name] ON 06/15/2023] LORazepam   0-4 mg Intravenous Q8H   multivitamin with minerals  1 tablet Oral Daily   [START ON 06/14/2023] pneumococcal 20-valent conjugate vaccine  0.5 mL Intramuscular Tomorrow-1000   sodium chloride  flush  3 mL Intravenous Q12H   thiamine   100 mg Oral Daily   Or   thiamine   100 mg Intravenous Daily      Subjective:   Elizabeth Mason was seen and examined today.  Currently no acute complaints.  No fever chills, nausea or vomiting or abdominal pain during encounter.  No acute chest pain, shortness of breath or wheezing .  Objective:   Vitals:   06/13/23 0234 06/13/23 0400 06/13/23 0700 06/13/23 0831  BP: 101/64   121/81  Pulse: (!) 102   75  Resp: 20 19 20 16   Temp: 98.2 F (36.8 C)   97.8 F (36.6 C)  TempSrc: Oral   Oral  SpO2: 99%   99%  Weight:      Height:        Intake/Output Summary (Last 24 hours) at 06/13/2023 1011 Last data filed at 06/13/2023 0835 Gross per 24 hour  Intake 1780.62 ml  Output 480 ml  Net 1300.62 ml     Wt Readings from Last 3 Encounters:  06/13/23 41.3 kg  09/18/21 41.3 kg  04/12/21 41.3 kg     Exam General: Alert and oriented x 3, NAD Cardiovascular: S1 S2 auscultated,  RRR Respiratory: Clear to auscultation bilaterally, no wheezing Gastrointestinal: Soft, nontender, nondistended, + bowel sounds Ext: no pedal edema bilaterally Neuro: no new deficits Skin: Left axilla scarring and skin retraction minimal surrounding erythema Psych: Normal affect     Data Reviewed:  I have personally reviewed following labs    CBC Lab Results  Component Value Date   WBC 18.8 (H) 06/13/2023   RBC 3.23 (L) 06/13/2023   HGB 10.9 (L) 06/13/2023   HCT 33.7 (L) 06/13/2023   MCV 104.3  (H) 06/13/2023   MCH 33.7 06/13/2023   PLT 377 06/13/2023   MCHC 32.3 06/13/2023   RDW 12.5 06/13/2023   LYMPHSABS  0.4 (L) 06/12/2023   MONOABS 0.0 (L) 06/12/2023   EOSABS 0.0 06/12/2023   BASOSABS 0.0 06/12/2023     Last metabolic panel Lab Results  Component Value Date   NA 135 06/13/2023   K 3.9 06/13/2023   CL 104 06/13/2023   CO2 22 06/13/2023   BUN 8 06/13/2023   CREATININE 0.78 06/13/2023   GLUCOSE 114 (H) 06/13/2023   GFRNONAA >60 06/13/2023   GFRAA >60 08/31/2017   CALCIUM 8.2 (L) 06/13/2023   PROT 7.5 06/12/2023   ALBUMIN 2.9 (L) 06/12/2023   BILITOT 0.6 06/12/2023   ALKPHOS 172 (H) 06/12/2023   AST 17 06/12/2023   ALT 11 06/12/2023   ANIONGAP 9 06/13/2023    CBG (last 3)  No results for input(s): "GLUCAP" in the last 72 hours.    Coagulation Profile: Recent Labs  Lab 06/12/23 2259  INR 1.0     Radiology Studies: I have personally reviewed the imaging studies  CT CHEST ABDOMEN PELVIS WO CONTRAST Result Date: 06/13/2023 CLINICAL DATA:  Sepsis EXAM: CT CHEST, ABDOMEN AND PELVIS WITHOUT CONTRAST TECHNIQUE: Multidetector CT imaging of the chest, abdomen and pelvis was performed following the standard protocol without IV contrast. RADIATION DOSE REDUCTION: This exam was performed according to the departmental dose-optimization program which includes automated exposure control, adjustment of the mA and/or kV according to patient size and/or use of iterative reconstruction technique. COMPARISON:  Same day chest radiograph; CT chest 09/18/2021 FINDINGS: CT CHEST FINDINGS Cardiovascular: No pericardial effusion. Normal caliber thoracic aorta. Mediastinum/Nodes: Trachea and esophagus are unremarkable. No thoracic adenopathy. Lungs/Pleura: No focal consolidation, pleural effusion, or pneumothorax. Musculoskeletal: Multiple sclerotic lesions throughout the thoracic vertebral bodies, sternum, and bilateral ribs. For example 12 mm lesion in the mid sternum (series 10/image  74) and 17 x 15 mm lesion in the T8 vertebral body. No pathologic fracture. Partially visualized soft tissue mass in the left axilla with skin retraction measuring approximately 4.0 x 2.3 cm in the axial plane (series 2/image 12). Additional focal soft tissue thickening in the right axilla on series 2/image 16 measuring 7 x 13 mm. These findings are similar to 09/18/2021. CT ABDOMEN PELVIS FINDINGS Hepatobiliary: Unremarkable noncontrast appearance of the liver, gallbladder, and biliary tree. Pancreas: Unremarkable. Spleen: Unremarkable. Adrenals/Urinary Tract: The adrenal glands are poorly visualized without IV contrast. Unremarkable right kidney. Mild right hydronephrosis. No obstructing stone. Unremarkable bladder. Stomach/Bowel: Normal caliber large and small bowel. No bowel wall thickening. Stomach and appendix are within normal limits. Vascular/Lymphatic: Aortic atherosclerosis. No enlarged abdominal or pelvic lymph nodes. Reproductive: Hysterectomy.  No adnexal mass. Other: No free intraperitoneal fluid or air. Musculoskeletal: No acute fracture. Numerous sclerotic lesions are present throughout the axial and visualized appendicular skeleton. For example a 13 mm sclerotic lesion in the L3 vertebral body IMPRESSION: 1. Numerous sclerotic lesions throughout the axial and visualized appendicular skeleton new compared 09/18/2021, suggestive of metastatic disease of unknown primary. 2. Edematous left kidney with left hydronephrosis new since 2023. No radiopaque stone. Given findings concerning for osseous metastases, CT abdomen pelvis with IV contrast and including a renal excretory phase is recommended for further evaluation. 3. Partially visualized soft tissue mass in the left axilla with skin retraction measuring approximately 4.0 x 2.3 cm in the axial plane. Additional focal soft tissue thickening in the right axilla measuring 7 x 13 mm. These findings are similar to 09/18/2021 differential considerations  include infectious/inflammatory process such as hydradenitis suppurativa or malignancy. Clinical and if appropriate histologic correlation is recommended. 4. Aortic  Atherosclerosis (ICD10-I70.0). Electronically Signed   By: Rozell Cornet M.D.   On: 06/13/2023 00:21   DG Chest Port 1 View Result Date: 06/12/2023 CLINICAL DATA:  Questionable sepsis-evaluate for abnormality EXAM: PORTABLE CHEST 1 VIEW COMPARISON:  Radiographs 04/10/2021 FINDINGS: Stable cardiomediastinal silhouette. No focal consolidation, pleural effusion, or pneumothorax. No displaced rib fractures. IMPRESSION: No active disease. Electronically Signed   By: Rozell Cornet M.D.   On: 06/12/2023 23:14       Oluwatoni Rotunno M.D. [facility] 06/13/2023, 10:11 AM  Available via Epic secure chat 7am-7pm After 7 pm, please refer to night coverage provider listed on amion.

## 2023-06-13 NOTE — Consult Note (Signed)
 Chief Complaint: Patient was seen in consultation today for SIRS in the setting of metastatic lesions with unknown primary, with consideration for axillary lymph node biopsy.  Referring Provider(s): Dr. Bertram Brocks, MD   Supervising Physician: Myrlene Asper  Patient Status: [facility] - In-pt  Patient is Full Code  History of Present Illness: Elizabeth Mason is a 58 y.o. female  with PMHx notable for EtOH abuse and depression.  Patient presented to ED on 5/23 with a 5 day history of shaking chills, intermittent abdominal pain, loss of appetite, and nausea with vomiting. She is admitted with left pyelonephritis. Urology consulted. On CT C/A/P w/o CM yesterday, findings concerning for metastasis with unknown primary, including a left axillary mass measuring 4.0 x 2.3 cm.   Interventional Radiology was requested for left axillary mass biopsy for evaluation of possible primary. Request was reviewed and approved by Dr. Mabel Savage. Patient is tentatively scheduled for same in IR on 5/27.   Patient is alert and laying in bed, calm. Son ans significant other at bedside. Patient is currently without any significant complaints. She remains with intermittent abdominal pain with some nausea. Patient denies any fevers, headache, chest pain, SOB, cough, vomiting or bleeding.     History reviewed. No pertinent past medical history.  Past Surgical History:  Procedure Laterality Date   ABDOMINAL HYSTERECTOMY      Allergies: Patient has no known allergies.  Medications: Prior to Admission medications   Medication Sig Start Date End Date Taking? Authorizing Provider  FLUoxetine  (PROZAC ) 10 MG capsule Take 1 capsule (10 mg total) by mouth daily. Patient not taking: Reported on 05/05/2021 04/17/21 05/17/21  Eleanore Grey, MD  nicotine  (NICODERM CQ  - DOSED IN MG/24 HOURS) 14 mg/24hr patch Place 1 patch (14 mg total) onto the skin daily. Patient not taking: Reported on 05/05/2021 04/17/21   Eleanore Grey, MD  sulfamethoxazole -trimethoprim  (BACTRIM  DS) 800-160 MG tablet Take 1 tablet by mouth every 12 (twelve) hours. Patient not taking: Reported on 05/05/2021 04/16/21   Eleanore Grey, MD  traZODone  (DESYREL ) 50 MG tablet Take 1 tablet (50 mg total) by mouth at bedtime as needed for sleep. Patient not taking: Reported on 09/18/2021 04/16/21   Eleanore Grey, MD     History reviewed. No pertinent family history.  Social History   Socioeconomic History   Marital status: Single    Spouse name: Not on file   Number of children: Not on file   Years of education: Not on file   Highest education level: Not on file  Occupational History   Not on file  Tobacco Use   Smoking status: Every Day    Current packs/day: 2.00    Average packs/day: 2.0 packs/day for 38.4 years (76.8 ttl pk-yrs)    Types: Cigarettes    Start date: 44   Smokeless tobacco: Never  Vaping Use   Vaping status: Never Used  Substance and Sexual Activity   Alcohol use: Yes    Alcohol/week: 21.0 standard drinks of alcohol    Types: 21 Cans of beer per week    Comment: occ beer   Drug use: Not Currently    Frequency: 3.0 times per week    Types: Marijuana   Sexual activity: Yes    Birth control/protection: None  Other Topics Concern   Not on file  Social History Narrative   Not on file   Social Drivers of Health   Financial Resource Strain: Not on file  Food Insecurity: No Food Insecurity (06/13/2023)  Hunger Vital Sign    Worried About Running Out of Food in the Last Year: Never true    Ran Out of Food in the Last Year: Never true  Transportation Needs: No Transportation Needs (06/13/2023)   PRAPARE - Administrator, Civil Service (Medical): No    Lack of Transportation (Non-Medical): No  Physical Activity: Not on file  Stress: Not on file  Social Connections: Not on file     Review of Systems: A 12 point ROS discussed and pertinent positives are indicated in the HPI above.  All  other systems are negative.  Vital Signs: BP 123/74 (BP Location: Left Arm)   Pulse 88   Temp 97.6 F (36.4 C) (Oral)   Resp 16   Ht 5\' 2"  (1.575 m)   Wt 91 lb 0.8 oz (41.3 kg)   SpO2 100%   BMI 16.65 kg/m   Advance Care Plan: The advanced care place/surrogate decision maker was discussed at the time of visit and the patient did not wish to discuss or was not able to name a surrogate decision maker or provide an advance care plan.  Physical Exam Constitutional:      General: She is not in acute distress.    Appearance: Normal appearance.  HENT:     Mouth/Throat:     Mouth: Mucous membranes are moist.  Cardiovascular:     Rate and Rhythm: Normal rate and regular rhythm.     Pulses: Normal pulses.     Heart sounds: No murmur heard. Pulmonary:     Effort: Pulmonary effort is normal.     Breath sounds: Normal breath sounds. No wheezing.  Musculoskeletal:        General: Normal range of motion.     Cervical back: Normal range of motion.  Skin:    General: Skin is warm and dry.  Neurological:     Mental Status: She is alert and oriented to person, place, and time.  Psychiatric:        Mood and Affect: Mood normal.        Behavior: Behavior normal.        Thought Content: Thought content normal.        Judgment: Judgment normal.     Imaging: CT CHEST ABDOMEN PELVIS WO CONTRAST Result Date: 06/13/2023 CLINICAL DATA:  Sepsis EXAM: CT CHEST, ABDOMEN AND PELVIS WITHOUT CONTRAST TECHNIQUE: Multidetector CT imaging of the chest, abdomen and pelvis was performed following the standard protocol without IV contrast. RADIATION DOSE REDUCTION: This exam was performed according to the departmental dose-optimization program which includes automated exposure control, adjustment of the mA and/or kV according to patient size and/or use of iterative reconstruction technique. COMPARISON:  Same day chest radiograph; CT chest 09/18/2021 FINDINGS: CT CHEST FINDINGS Cardiovascular: No pericardial  effusion. Normal caliber thoracic aorta. Mediastinum/Nodes: Trachea and esophagus are unremarkable. No thoracic adenopathy. Lungs/Pleura: No focal consolidation, pleural effusion, or pneumothorax. Musculoskeletal: Multiple sclerotic lesions throughout the thoracic vertebral bodies, sternum, and bilateral ribs. For example 12 mm lesion in the mid sternum (series 10/image 74) and 17 x 15 mm lesion in the T8 vertebral body. No pathologic fracture. Partially visualized soft tissue mass in the left axilla with skin retraction measuring approximately 4.0 x 2.3 cm in the axial plane (series 2/image 12). Additional focal soft tissue thickening in the right axilla on series 2/image 16 measuring 7 x 13 mm. These findings are similar to 09/18/2021. CT ABDOMEN PELVIS FINDINGS Hepatobiliary: Unremarkable noncontrast appearance of the  liver, gallbladder, and biliary tree. Pancreas: Unremarkable. Spleen: Unremarkable. Adrenals/Urinary Tract: The adrenal glands are poorly visualized without IV contrast. Unremarkable right kidney. Mild right hydronephrosis. No obstructing stone. Unremarkable bladder. Stomach/Bowel: Normal caliber large and small bowel. No bowel wall thickening. Stomach and appendix are within normal limits. Vascular/Lymphatic: Aortic atherosclerosis. No enlarged abdominal or pelvic lymph nodes. Reproductive: Hysterectomy.  No adnexal mass. Other: No free intraperitoneal fluid or air. Musculoskeletal: No acute fracture. Numerous sclerotic lesions are present throughout the axial and visualized appendicular skeleton. For example a 13 mm sclerotic lesion in the L3 vertebral body IMPRESSION: 1. Numerous sclerotic lesions throughout the axial and visualized appendicular skeleton new compared 09/18/2021, suggestive of metastatic disease of unknown primary. 2. Edematous left kidney with left hydronephrosis new since 2023. No radiopaque stone. Given findings concerning for osseous metastases, CT abdomen pelvis with IV  contrast and including a renal excretory phase is recommended for further evaluation. 3. Partially visualized soft tissue mass in the left axilla with skin retraction measuring approximately 4.0 x 2.3 cm in the axial plane. Additional focal soft tissue thickening in the right axilla measuring 7 x 13 mm. These findings are similar to 09/18/2021 differential considerations include infectious/inflammatory process such as hydradenitis suppurativa or malignancy. Clinical and if appropriate histologic correlation is recommended. 4. Aortic Atherosclerosis (ICD10-I70.0). Electronically Signed   By: Rozell Cornet M.D.   On: 06/13/2023 00:21   DG Chest Port 1 View Result Date: 06/12/2023 CLINICAL DATA:  Questionable sepsis-evaluate for abnormality EXAM: PORTABLE CHEST 1 VIEW COMPARISON:  Radiographs 04/10/2021 FINDINGS: Stable cardiomediastinal silhouette. No focal consolidation, pleural effusion, or pneumothorax. No displaced rib fractures. IMPRESSION: No active disease. Electronically Signed   By: Rozell Cornet M.D.   On: 06/12/2023 23:14    Labs:  CBC: Recent Labs    06/12/23 2259 06/13/23 0450  WBC 5.0 18.8*  HGB 11.4* 10.9*  HCT 33.7* 33.7*  PLT 402* 377    COAGS: Recent Labs    06/12/23 2259  INR 1.0    BMP: Recent Labs    06/12/23 2259 06/13/23 0450  NA 135 135  K 2.8* 3.9  CL 102 104  CO2 20* 22  GLUCOSE 105* 114*  BUN 7 8  CALCIUM 8.5* 8.2*  CREATININE 0.67 0.78  GFRNONAA >60 >60    LIVER FUNCTION TESTS: Recent Labs    06/12/23 2259  BILITOT 0.6  AST 17  ALT 11  ALKPHOS 172*  PROT 7.5  ALBUMIN 2.9*    TUMOR MARKERS: No results for input(s): "AFPTM", "CEA", "CA199", "CHROMGRNA" in the last 8760 hours.  Assessment and Plan: Patient presented to ED on 5/23 with a 5 day history of shaking chills, intermittent abdominal pain, loss of appetite, and nausea with vomiting. She is admitted with left pyelonephritis. Urology consulted. On CT C/A/P w/o CM yesterday,  findings concerning for metastasis with unknown primary, including a left axillary mass measuring 4.0 x 2.3 cm. IR was requested for biopsy.  Per Dr. Benito Bran request, patient will undergo limited US  of left extremity for evaluation of lymph node for biopsy purposes. Study is pending. Patient is tentatively set up with sedation, though she understands that this will likely require local anesthetic only. IR will follow up with patient once US  study is reviewed.  Patient will present for tentatively scheduled left axillary lymph node biopsy in IR on 5/27.  For now, patient will be NPO at midnight on 5/27 in anticipation of sedation. If local only, patient may eat.   All labs  and medications are currently within acceptable parameters.  No pertinent allergies.   Patient remains on enteric precautions.  Risks and benefits of lymph node biopsy was discussed with the patient and/or patient's family including, but not limited to bleeding, infection, damage to adjacent structures or low yield requiring additional tests.  All of the questions were answered and there is agreement to proceed.  Consent signed and in chart.      Thank you for allowing our service to participate in Elizabeth Mason 's care.  Electronically Signed: Lovena Rubinstein, PA-C   06/13/2023, 2:39 PM      I spent a total of 40 Minutes in face to face in clinical consultation, greater than 50% of which was counseling/coordinating care for SIRS in the setting of metastatic lesions with unknown primary, with consideration for axillary lymph node biopsy. \

## 2023-06-13 NOTE — Progress Notes (Signed)
   06/13/23 0234  Vitals  Temp 98.2 F (36.8 C)  Temp Source Oral  BP 101/64  MAP (mmHg) 76  BP Location Left Arm  BP Method Automatic  Patient Position (if appropriate) Lying  Pulse Rate (!) 102  Pulse Rate Source Monitor  Resp 20  MEWS COLOR  MEWS Score Color Green  Oxygen Therapy  SpO2 99 %  O2 Device Nasal Cannula  O2 Flow Rate (L/min) 3 L/min  Pain Assessment  Pain Scale 0-10  Pain Score 0  MEWS Score  MEWS Temp 0  MEWS Systolic 0  MEWS Pulse 1  MEWS RR 0  MEWS LOC 0  MEWS Score 1   The patient is admitted from ED to 4 W 36. A &O x 4. Patient denied any acute pain at this moment. The patient is oriented to to her room.ascom/call bell and staff. Will continue to monitor.

## 2023-06-14 ENCOUNTER — Inpatient Hospital Stay (HOSPITAL_COMMUNITY): Payer: MEDICAID

## 2023-06-14 DIAGNOSIS — C7951 Secondary malignant neoplasm of bone: Secondary | ICD-10-CM

## 2023-06-14 DIAGNOSIS — N1330 Unspecified hydronephrosis: Secondary | ICD-10-CM

## 2023-06-14 DIAGNOSIS — E876 Hypokalemia: Secondary | ICD-10-CM

## 2023-06-14 DIAGNOSIS — R2233 Localized swelling, mass and lump, upper limb, bilateral: Secondary | ICD-10-CM

## 2023-06-14 DIAGNOSIS — N6321 Unspecified lump in the left breast, upper outer quadrant: Secondary | ICD-10-CM

## 2023-06-14 LAB — BASIC METABOLIC PANEL WITH GFR
Anion gap: 6 (ref 5–15)
BUN: 5 mg/dL — ABNORMAL LOW (ref 6–20)
CO2: 27 mmol/L (ref 22–32)
Calcium: 8 mg/dL — ABNORMAL LOW (ref 8.9–10.3)
Chloride: 106 mmol/L (ref 98–111)
Creatinine, Ser: 0.74 mg/dL (ref 0.44–1.00)
GFR, Estimated: >60 mL/min (ref >60)
Glucose, Bld: 100 mg/dL — ABNORMAL HIGH (ref 70–99)
Potassium: 3.7 mmol/L (ref 3.5–5.1)
Sodium: 139 mmol/L (ref 135–145)

## 2023-06-14 LAB — CBC
HCT: 31.5 % — ABNORMAL LOW (ref 36.0–46.0)
Hemoglobin: 10.3 g/dL — ABNORMAL LOW (ref 12.0–15.0)
MCH: 34 pg (ref 26.0–34.0)
MCHC: 32.7 g/dL (ref 30.0–36.0)
MCV: 104 fL — ABNORMAL HIGH (ref 80.0–100.0)
Platelets: 356 10*3/uL (ref 150–400)
RBC: 3.03 MIL/uL — ABNORMAL LOW (ref 3.87–5.11)
RDW: 12.7 % (ref 11.5–15.5)
WBC: 11.1 10*3/uL — ABNORMAL HIGH (ref 4.0–10.5)
nRBC: 0 % (ref 0.0–0.2)

## 2023-06-14 LAB — URINE CULTURE

## 2023-06-14 NOTE — Progress Notes (Signed)
   06/14/23 0942  TOC Brief Assessment  Patient has primary care physician No  Home environment has been reviewed Home, daughter is primary contact  Prior level of function: Independent  Prior/Current Home Services No current home services  Social Drivers of Health Review SDOH reviewed no interventions necessary  Readmission risk has been reviewed Yes  Transition of care needs transition of care needs identified, TOC will continue to follow (Consult for SA education/resources)

## 2023-06-14 NOTE — Progress Notes (Signed)
 [facility]                                                                              Elizabeth Mason, is a 58 y.o. female, DOB - 1966/01/09, GNF:[id number] Admit date - 06/12/2023    Outpatient Primary MD for the Elizabeth Mason is Elizabeth Mason, No Pcp Per  LOS - 1  days  Chief Complaint  Elizabeth Mason presents with   Code Sepsis       Brief summary   Elizabeth Mason is a 58 year old female with depression, alcohol abuse presented with shaking chills, intermittent abdominal pain with nausea, vomiting.  Elizabeth Mason reported shaking chills for the past 5 days prior to admission, loss of appetite, pain in the mid abdomen, intermittent, occasional dysuria. In ED, temp 103.2 F, RR 22-25, pulse 140, BP 159/77, O2 sats 95% on room air.  WBCs 18.8    Assessment & Plan    Principal Problem: Sepsis likely due to UTI, pyelonephritis, ?  Malignancy of unknown primary, POA - Elizabeth Mason met sepsis criteria on admission with fevers, tachypnea, tachycardia, leukocytosis, source unclear, possibly pyelonephritis - Has left axillary lesion, chest x-ray shows no focal solid lesion, UA positive for UTI - BCID: +, Cultures pending, urine culture showed multiple species - Continue IV vancomycin, cefepime, procalcitonin 18.87.  Repeat in a.m.   Active Problems: Left renal pyelonephritis with hydronephrosis - CT showed edematous left kidney with left hydronephrosis new since 2023, no stone  - Continue IV vancomycin, cefepime - Repeat CT with contrast showed left pyelonephritis, no renal abscess.  Persistent left-sided hydronephrosis and hydroureter likely due to pyelonephritis, no calculus or obstructing mass identified.  Mass of axilla, bilateral, sclerotic lesions, likely metastatic disease of unknown primary - Elizabeth Mason has a soft tissue mass in the left axilla 4.0 x 2.3 cm, additional soft tissue thickening in the right axilla 7 x 13 mm - IR consulted for biopsy, recommended ultrasound of the axilla and core  biopsy planned by IR on 5/27. - CT chest abdomen pelvis showed abnormal soft tissue density in the left upper outer breast, given sclerotic metastasis underlying breast cancer is a concern. - Given concern for possible breast cancer, oncology consulted, discussed with Dr. Salomon Cree, on-call, ideally will see the Elizabeth Mason after tissue sampling  Hypokalemia - Replace as needed    Alcohol use -Currently stable, not in any acute withdrawals, -Continue CIWA with Ativan     MDD (major depressive disorder) -Currently stable  Acute hypoxic respiratory failure - No acute chest pain, shortness of breath, Elizabeth Mason was placed on 3 L O2 via [place] in ED -   flu, RSV, COVID-negative.  Chest x-ray negative for any pneumonia - Resolved, on room air  Underweight Estimated body mass index is 16.65 kg/m as calculated from the following:   Height as of this encounter: 5\' 2"  (1.575 m).   Weight as of this encounter: 41.3 kg.  Code Status: Full code DVT Prophylaxis:  enoxaparin  (LOVENOX ) injection 30 mg Start: 06/13/23 1000   Level of Care: Level of care: Progressive Family Communication: Updated Elizabeth Mason Disposition Plan:      Remains inpatient appropriate: Ongoing workup   Procedures:  Consultants:   Intervention radiology  Antimicrobials:   Anti-infectives (From admission, onward)    Start     Dose/Rate Route Frequency Ordered Stop   06/14/23 1400  vancomycin  (VANCOCIN ) IVPB 1000 mg/200 mL premix        1,000 mg 200 mL/hr over 60 Minutes Intravenous Every 36 hours 06/13/23 0216     06/13/23 1200  ceFEPIme  (MAXIPIME ) 2 g in sodium chloride  0.9 % 100 mL IVPB        2 g 200 mL/hr over 30 Minutes Intravenous Every 12 hours 06/13/23 0103     06/13/23 1100  metroNIDAZOLE  (FLAGYL ) IVPB 500 mg  Status:  Discontinued        500 mg 100 mL/hr over 60 Minutes Intravenous Every 12 hours 06/13/23 0053 06/13/23 1848   06/12/23 2300  ceFEPIme  (MAXIPIME ) 2 g in sodium chloride  0.9 % 100 mL IVPB        2  g 200 mL/hr over 30 Minutes Intravenous  Once 06/12/23 2259 06/12/23 2353   06/12/23 2300  metroNIDAZOLE  (FLAGYL ) IVPB 500 mg        500 mg 100 mL/hr over 60 Minutes Intravenous  Once 06/12/23 2259 06/13/23 0123   06/12/23 2300  vancomycin  (VANCOCIN ) IVPB 1000 mg/200 mL premix        1,000 mg 200 mL/hr over 60 Minutes Intravenous  Once 06/12/23 2259 06/13/23 0701          Medications  enoxaparin  (LOVENOX ) injection  30 mg Subcutaneous Q24H   folic acid   1 mg Oral Daily   LORazepam   0-4 mg Intravenous Q4H   Followed by   Elizabeth Mason ON 06/15/2023] LORazepam   0-4 mg Intravenous Q8H   multivitamin with minerals  1 tablet Oral Daily   pneumococcal 20-valent conjugate vaccine  0.5 mL Intramuscular Tomorrow-1000   sodium chloride  flush  3 mL Intravenous Q12H   thiamine   100 mg Oral Daily   Or   thiamine   100 mg Intravenous Daily      Subjective:   Elizabeth Mason was seen and examined today.  No fevers or chills, drainage from the left axillary area.  No nausea vomiting or abdominal pain.  No acute chest pain or shortness of breath.    Objective:   Vitals:   06/13/23 1721 06/13/23 2114 06/14/23 0452 06/14/23 0947  BP:  118/68 118/69 115/71  Pulse:  81 78 89  Resp: (!) 21 14 15    Temp:  98 F (36.7 C) 98.2 F (36.8 C) 98.2 F (36.8 C)  TempSrc:  Oral Oral Oral  SpO2:  98% 99% 100%  Weight:      Height:        Intake/Output Summary (Last 24 hours) at 06/14/2023 1203 Last data filed at 06/14/2023 0428 Gross per 24 hour  Intake 662.53 ml  Output --  Net 662.53 ml     Wt Readings from Last 3 Encounters:  06/13/23 41.3 kg  09/18/21 41.3 kg  04/12/21 41.3 kg    Physical Exam General: Alert and oriented x 3, NAD Cardiovascular: S1 S2 clear, RRR.  Respiratory: CTAB, no wheezing Gastrointestinal: Soft, nontender, nondistended, NBS Ext: no pedal edema bilaterally Neuro: no new deficits Skin: Left axilla/outer breast area with scarring, skin retraction and drainage,  dressing intact Psych: Normal affect       Data Reviewed:  I have personally reviewed following labs    CBC Lab Results  Component Value Date   WBC 11.1 (H) 06/14/2023   RBC 3.03 (L) 06/14/2023  HGB 10.3 (L) 06/14/2023   HCT 31.5 (L) 06/14/2023   MCV 104.0 (H) 06/14/2023   MCH 34.0 06/14/2023   PLT 356 06/14/2023   MCHC 32.7 06/14/2023   RDW 12.7 06/14/2023   LYMPHSABS 0.4 (L) 06/12/2023   MONOABS 0.0 (L) 06/12/2023   EOSABS 0.0 06/12/2023   BASOSABS 0.0 06/12/2023     Last metabolic panel Lab Results  Component Value Date   NA 139 06/14/2023   K 3.7 06/14/2023   CL 106 06/14/2023   CO2 27 06/14/2023   BUN 5 (L) 06/14/2023   CREATININE 0.74 06/14/2023   GLUCOSE 100 (H) 06/14/2023   GFRNONAA >60 06/14/2023   GFRAA >60 08/31/2017   CALCIUM 8.0 (L) 06/14/2023   PROT 7.5 06/12/2023   ALBUMIN 2.9 (L) 06/12/2023   BILITOT 0.6 06/12/2023   ALKPHOS 172 (H) 06/12/2023   AST 17 06/12/2023   ALT 11 06/12/2023   ANIONGAP 6 06/14/2023    CBG (last 3)  No results for input(s): "GLUCAP" in the last 72 hours.    Coagulation Profile: Recent Labs  Lab 06/12/23 2259  INR 1.0     Radiology Studies: I have personally reviewed the imaging studies  US  LT UPPER EXTREM LTD SOFT TISSUE NON VASCULAR Result Date: 06/13/2023 CLINICAL DATA:  Left axillary lymphadenopathy. EXAM: ULTRASOUND LEFT UPPER EXTREMITY LIMITED TECHNIQUE: Ultrasound examination of the upper extremity soft tissues was performed in the area of clinical concern. COMPARISON:  06/13/2023 FINDINGS: A soft tissue mass is noted in the left axilla measuring 2.7 x 1.1 x 2.9 cm with internal vascularity. IMPRESSION: A soft tissue mass in the left axilla measuring up to 2.9 cm with internal vascularity. Findings may represent metastatic disease versus lymphadenopathy. Correlation with diagnostic mammography is recommended given history of breast cancer. Electronically Signed   By: Wyvonnia Heimlich M.D.   On: 06/13/2023  17:23   CT CHEST ABDOMEN PELVIS W CONTRAST Result Date: 06/13/2023 CLINICAL DATA:  Metastatic evaluation, nausea and vomiting, abdominal pain and lethargy, sclerotic lesions on recent CT concerning for bony metastases EXAM: CT CHEST, ABDOMEN, AND PELVIS WITH CONTRAST TECHNIQUE: Multidetector CT imaging of the chest, abdomen and pelvis was performed following the standard protocol during bolus administration of intravenous contrast. RADIATION DOSE REDUCTION: This exam was performed according to the departmental dose-optimization program which includes automated exposure control, adjustment of the mA and/or kV according to Elizabeth Mason size and/or use of iterative reconstruction technique. CONTRAST:  80mL OMNIPAQUE  IOHEXOL  300 MG/ML  SOLN COMPARISON:  06/12/2023, 09/18/2021 FINDINGS: CT CHEST FINDINGS Cardiovascular: The heart is unremarkable without pericardial effusion. No evidence of thoracic aortic aneurysm or dissection. Mediastinum/Nodes: Thyroid, trachea, and esophagus are unremarkable. There is a small hiatal hernia. No pathologic mediastinal, hilar, or axillary adenopathy. Lungs/Pleura: No acute airspace disease, effusion, or pneumothorax. Central airways are patent. Musculoskeletal: Numerous sclerotic foci are again noted within the axial and appendicular skeleton, most pronounced within the sternum, thoracic spine, thoracic cage, and left humeral head. 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 There are no acute displaced fractures. There is asymmetric enhancing soft tissue within the upper and outer aspects of the left breast, contiguous with the skin thickening and retraction in the left axilla seen on prior study. Correlation with mammographic evaluation is recommended to assess for underlying breast cancer. Reconstructed images demonstrate no additional findings. CT ABDOMEN PELVIS FINDINGS Hepatobiliary: There is a 2.2 cm hemangioma seen within the superior left lobe liver reference image 44/2. No  other focal liver abnormalities. The gallbladder is unremarkable.  No biliary duct dilation. Pancreas: Unremarkable. No pancreatic ductal dilatation or surrounding inflammatory changes. Spleen: Normal in size without focal abnormality. Adrenals/Urinary Tract: Enlarged edematous left kidney is again identified, with stable hydronephrosis. Heterogeneous enhancement of the left renal parenchyma is noted, especially on delayed images. There is delayed excretion of contrast. No radiopaque calculus or obstructing soft tissue mass is identified on this exam. The right kidney enhances normally. The bladder is moderately distended, with normal right ureteral jet identified on delayed imaging. The left ureteral jet is not visualized. Right adrenal is unremarkable. There is nonspecific thickening of the left adrenal gland, measuring 2 cm in thickness with attenuation of 75 HU. Stomach/Bowel: No bowel obstruction or ileus. Normal appendix right lower quadrant. No bowel wall thickening or inflammatory change. Small hiatal hernia. Vascular/Lymphatic: Aortic atherosclerosis. No enlarged abdominal or pelvic lymph nodes. Reproductive: Prior hysterectomy.  No adnexal masses. Other: Trace pelvic free fluid. No free intraperitoneal gas. No abdominal wall hernia. Musculoskeletal: Multiple sclerotic lesions are again noted throughout the axial and appendicular skeleton, most prominent within the lumbosacral spine and iliac bones, consistent with sclerotic metastases. Reconstructed images demonstrate no additional findings. IMPRESSION: 1. Enlarged edematous left kidney, with heterogeneous enhancement and delayed excretion of contrast, consistent with pyelonephritis. No evidence of renal abscess. 2. Persistent left-sided hydronephrosis and hydroureter likely due to pyelonephritis. No radiopaque calculus or obstructing mass identified. If further evaluation is desired, retrograde evaluation could be considered when clinical situation  permits. 3. Diffuse sclerotic lesions throughout the axial and appendicular skeleton, unchanged, consistent with sclerotic metastases. 4. Abnormal soft tissue density within the upper-outer left breast, contiguous with the skin thickening and retraction in the left axilla reported previously. Given sclerotic metastases, underlying breast cancer is a concern. Correlation with mammographic evaluation recommended. 5. Trace pelvic free fluid, nonspecific. 6.  Aortic Atherosclerosis (ICD10-I70.0). Electronically Signed   By: Bobbye Burrow M.D.   On: 06/13/2023 16:25   CT CHEST ABDOMEN PELVIS WO CONTRAST Result Date: 06/13/2023 CLINICAL DATA:  Sepsis EXAM: CT CHEST, ABDOMEN AND PELVIS WITHOUT CONTRAST TECHNIQUE: Multidetector CT imaging of the chest, abdomen and pelvis was performed following the standard protocol without IV contrast. RADIATION DOSE REDUCTION: This exam was performed according to the departmental dose-optimization program which includes automated exposure control, adjustment of the mA and/or kV according to Elizabeth Mason size and/or use of iterative reconstruction technique. COMPARISON:  Same day chest radiograph; CT chest 09/18/2021 FINDINGS: CT CHEST FINDINGS Cardiovascular: No pericardial effusion. Normal caliber thoracic aorta. Mediastinum/Nodes: Trachea and esophagus are unremarkable. No thoracic adenopathy. Lungs/Pleura: No focal consolidation, pleural effusion, or pneumothorax. Musculoskeletal: Multiple sclerotic lesions throughout the thoracic vertebral bodies, sternum, and bilateral ribs. For example 12 mm lesion in the mid sternum (series 10/image 74) and 17 x 15 mm lesion in the T8 vertebral body. No pathologic fracture. Partially visualized soft tissue mass in the left axilla with skin retraction measuring approximately 4.0 x 2.3 cm in the axial plane (series 2/image 12). Additional focal soft tissue thickening in the right axilla on series 2/image 16 measuring 7 x 13 mm. These findings are  similar to 09/18/2021. CT ABDOMEN PELVIS FINDINGS Hepatobiliary: Unremarkable noncontrast appearance of the liver, gallbladder, and biliary tree. Pancreas: Unremarkable. Spleen: Unremarkable. Adrenals/Urinary Tract: The adrenal glands are poorly visualized without IV contrast. Unremarkable right kidney. Mild right hydronephrosis. No obstructing stone. Unremarkable bladder. Stomach/Bowel: Normal caliber large and small bowel. No bowel wall thickening. Stomach and appendix are within normal limits. Vascular/Lymphatic: Aortic atherosclerosis. No enlarged abdominal or pelvic lymph nodes.  Reproductive: Hysterectomy.  No adnexal mass. Other: No free intraperitoneal fluid or air. Musculoskeletal: No acute fracture. Numerous sclerotic lesions are present throughout the axial and visualized appendicular skeleton. For example a 13 mm sclerotic lesion in the L3 vertebral body IMPRESSION: 1. Numerous sclerotic lesions throughout the axial and visualized appendicular skeleton new compared 09/18/2021, suggestive of metastatic disease of unknown primary. 2. Edematous left kidney with left hydronephrosis new since 2023. No radiopaque stone. Given findings concerning for osseous metastases, CT abdomen pelvis with IV contrast and including a renal excretory phase is recommended for further evaluation. 3. Partially visualized soft tissue mass in the left axilla with skin retraction measuring approximately 4.0 x 2.3 cm in the axial plane. Additional focal soft tissue thickening in the right axilla measuring 7 x 13 mm. These findings are similar to 09/18/2021 differential considerations include infectious/inflammatory process such as hydradenitis suppurativa or malignancy. Clinical and if appropriate histologic correlation is recommended. 4. Aortic Atherosclerosis (ICD10-I70.0). Electronically Signed   By: Rozell Cornet M.D.   On: 06/13/2023 00:21   DG Chest Port 1 View Result Date: 06/12/2023 CLINICAL DATA:  Questionable  sepsis-evaluate for abnormality EXAM: PORTABLE CHEST 1 VIEW COMPARISON:  Radiographs 04/10/2021 FINDINGS: Stable cardiomediastinal silhouette. No focal consolidation, pleural effusion, or pneumothorax. No displaced rib fractures. IMPRESSION: No active disease. Electronically Signed   By: Rozell Cornet M.D.   On: 06/12/2023 23:14       Abby Stines M.D. [facility] 06/14/2023, 12:03 PM  Available via Epic secure chat 7am-7pm After 7 pm, please refer to night coverage provider listed on amion.

## 2023-06-14 NOTE — Plan of Care (Signed)
   Problem: Fluid Volume: Goal: Hemodynamic stability will improve Outcome: Progressing   Problem: Clinical Measurements: Goal: Diagnostic test results will improve Outcome: Progressing Goal: Signs and symptoms of infection will decrease Outcome: Progressing   Problem: Respiratory: Goal: Ability to maintain adequate ventilation will improve Outcome: Progressing

## 2023-06-14 NOTE — Consult Note (Signed)
 Aaron Aas   HEMATOLOGY/ONCOLOGY CONSULTATION NOTE  Date of Service: 06/14/2023  Patient Care Team: Patient, No Pcp Per as PCP - General (General Practice)  CHIEF COMPLAINTS/PURPOSE OF CONSULTATION:  Concern for metastatic malignancy-likely from breast primary  HISTORY OF PRESENTING ILLNESS:  Elizabeth Mason is a wonderful 58 y.o. female who has been referred to us  by Dr Vicente Graham for evaluation and management of ***  MEDICAL HISTORY:  History reviewed. No pertinent past medical history.  SURGICAL HISTORY: Past Surgical History:  Procedure Laterality Date   ABDOMINAL HYSTERECTOMY      SOCIAL HISTORY: Social History   Socioeconomic History   Marital status: Single    Spouse name: Not on file   Number of children: Not on file   Years of education: Not on file   Highest education level: Not on file  Occupational History   Not on file  Tobacco Use   Smoking status: Every Day    Current packs/day: 2.00    Average packs/day: 2.0 packs/day for 38.4 years (76.8 ttl pk-yrs)    Types: Cigarettes    Start date: 75   Smokeless tobacco: Never  Vaping Use   Vaping status: Never Used  Substance and Sexual Activity   Alcohol use: Yes    Alcohol/week: 21.0 standard drinks of alcohol    Types: 21 Cans of beer per week    Comment: occ beer   Drug use: Not Currently    Frequency: 3.0 times per week    Types: Marijuana   Sexual activity: Yes    Birth control/protection: None  Other Topics Concern   Not on file  Social History Narrative   Not on file   Social Drivers of Health   Financial Resource Strain: Not on file  Food Insecurity: No Food Insecurity (06/13/2023)   Hunger Vital Sign    Worried About Running Out of Food in the Last Year: Never true    Ran Out of Food in the Last Year: Never true  Transportation Needs: No Transportation Needs (06/13/2023)   PRAPARE - Administrator, Civil Service (Medical): No    Lack of Transportation (Non-Medical): No  Physical Activity:  Not on file  Stress: Not on file  Social Connections: Not on file  Intimate Partner Violence: Not At Risk (06/13/2023)   Humiliation, Afraid, Rape, and Kick questionnaire    Fear of Current or Ex-Partner: No    Emotionally Abused: No    Physically Abused: No    Sexually Abused: No    FAMILY HISTORY: History reviewed. No pertinent family history.  ALLERGIES:  has no known allergies.  MEDICATIONS:  Current Facility-Administered Medications  Medication Dose Route Frequency Provider Last Rate Last Admin   acetaminophen  (TYLENOL ) tablet 650 mg  650 mg Oral Q6H PRN Opyd, Timothy S, MD       Or   acetaminophen  (TYLENOL ) suppository 650 mg  650 mg Rectal Q6H PRN Opyd, Timothy S, MD       ceFEPIme (MAXIPIME) 2 g in sodium chloride  0.9 % 100 mL IVPB  2 g Intravenous Q12H Opyd, Timothy S, MD 200 mL/hr at 06/14/23 1250 2 g at 06/14/23 1250   enoxaparin  (LOVENOX ) injection 30 mg  30 mg Subcutaneous Q24H Opyd, Timothy S, MD   30 mg at 06/14/23 0944   folic acid  (FOLVITE ) tablet 1 mg  1 mg Oral Daily Opyd, Timothy S, MD   1 mg at 06/14/23 0944   HYDROmorphone (DILAUDID) injection 0.5 mg  0.5 mg Intravenous Q4H  PRN Opyd, Timothy S, MD       LORazepam  (ATIVAN ) injection 0-4 mg  0-4 mg Intravenous Q4H Opyd, Timothy S, MD       Followed by   [name] ON 06/15/2023] LORazepam  (ATIVAN ) injection 0-4 mg  0-4 mg Intravenous Q8H Opyd, Timothy S, MD       LORazepam  (ATIVAN ) tablet 1-4 mg  1-4 mg Oral Q1H PRN Opyd, Timothy S, MD       Or   LORazepam  (ATIVAN ) injection 1-4 mg  1-4 mg Intravenous Q1H PRN Opyd, Timothy S, MD       multivitamin with minerals tablet 1 tablet  1 tablet Oral Daily Opyd, Timothy S, MD   1 tablet at 06/14/23 0944   ondansetron  (ZOFRAN ) tablet 4 mg  4 mg Oral Q6H PRN Opyd, Timothy S, MD   4 mg at 06/13/23 1306   Or   ondansetron  (ZOFRAN ) injection 4 mg  4 mg Intravenous Q6H PRN Opyd, Timothy S, MD       oxyCODONE  (Oxy IR/ROXICODONE ) immediate release tablet 5 mg  5 mg Oral Q4H PRN Opyd,  Timothy S, MD       pneumococcal 20-valent conjugate vaccine (PREVNAR 20) injection 0.5 mL  0.5 mL Intramuscular Tomorrow-1000 Rai, Ripudeep K, MD       polyethylene glycol (MIRALAX / GLYCOLAX) packet 17 g  17 g Oral Daily PRN Opyd, Timothy S, MD       sodium chloride  flush (NS) 0.9 % injection 3 mL  3 mL Intravenous Q12H Opyd, Timothy S, MD   3 mL at 06/14/23 1248   thiamine  (VITAMIN B1) tablet 100 mg  100 mg Oral Daily Opyd, Timothy S, MD   100 mg at 06/14/23 1610   Or   thiamine  (VITAMIN B1) injection 100 mg  100 mg Intravenous Daily Opyd, Timothy S, MD       vancomycin (VANCOCIN) IVPB 1000 mg/200 mL premix  1,000 mg Intravenous Q36H Donnajean Fuse, RPH        REVIEW OF SYSTEMS:    10 Point review of Systems was done is negative except as noted above.  PHYSICAL EXAMINATION: ECOG PERFORMANCE STATUS: {CHL ONC ECOG RU:[id number]}  . Vitals:   06/14/23 0452 06/14/23 0947  BP: 118/69 115/71  Pulse: 78 89  Resp: 15   Temp: 98.2 F (36.8 C) 98.2 F (36.8 C)  SpO2: 99% 100%   Filed Weights   06/13/23 0045  Weight: 91 lb 0.8 oz (41.3 kg)   .Body mass index is 16.65 kg/m.  GENERAL:alert, in no acute distress and comfortable SKIN: no acute rashes, no significant lesions EYES: conjunctiva are pink and non-injected, sclera anicteric OROPHARYNX: MMM, no exudates, no oropharyngeal erythema or ulceration NECK: supple, no JVD LYMPH:  no palpable lymphadenopathy in the cervical, axillary or inguinal regions LUNGS: clear to auscultation b/l with normal respiratory effort HEART: regular rate & rhythm ABDOMEN:  normoactive bowel sounds , non tender, not distended. Extremity: no pedal edema PSYCH: alert & oriented x 3 with fluent speech NEURO: no focal motor/sensory deficits  LABORATORY DATA:  I have reviewed the data as listed  .    Latest Ref Rng & Units 06/14/2023    4:18 AM 06/13/2023    4:50 AM 06/12/2023   10:59 PM  CBC  WBC 4.0 - 10.5 K/uL 11.1  18.8  5.0   Hemoglobin  12.0 - 15.0 g/dL 14.7  82.9  56.2   Hematocrit 36.0 - 46.0 % 31.5  33.7  33.7  Platelets 150 - 400 K/uL 356  377  402     .    Latest Ref Rng & Units 06/14/2023    4:18 AM 06/13/2023    4:50 AM 06/12/2023   10:59 PM  CMP  Glucose 70 - 99 mg/dL 578  469  629   BUN 6 - 20 mg/dL 5  8  7    Creatinine 0.44 - 1.00 mg/dL 5.28  4.13  2.44   Sodium 135 - 145 mmol/L 139  135  135   Potassium 3.5 - 5.1 mmol/L 3.7  3.9  2.8   Chloride 98 - 111 mmol/L 106  104  102   CO2 22 - 32 mmol/L 27  22  20    Calcium 8.9 - 10.3 mg/dL 8.0  8.2  8.5   Total Protein 6.5 - 8.1 g/dL   7.5   Total Bilirubin 0.0 - 1.2 mg/dL   0.6   Alkaline Phos 38 - 126 U/L   172   AST 15 - 41 U/L   17   ALT 0 - 44 U/L   11      RADIOGRAPHIC STUDIES: I have personally reviewed the radiological images as listed and agreed with the findings in the report. US  LT UPPER EXTREM LTD SOFT TISSUE NON VASCULAR Result Date: 06/13/2023 CLINICAL DATA:  Left axillary lymphadenopathy. EXAM: ULTRASOUND LEFT UPPER EXTREMITY LIMITED TECHNIQUE: Ultrasound examination of the upper extremity soft tissues was performed in the area of clinical concern. COMPARISON:  06/13/2023 FINDINGS: A soft tissue mass is noted in the left axilla measuring 2.7 x 1.1 x 2.9 cm with internal vascularity. IMPRESSION: A soft tissue mass in the left axilla measuring up to 2.9 cm with internal vascularity. Findings may represent metastatic disease versus lymphadenopathy. Correlation with diagnostic mammography is recommended given history of breast cancer. Electronically Signed   By: Wyvonnia Heimlich M.D.   On: 06/13/2023 17:23   CT CHEST ABDOMEN PELVIS W CONTRAST Result Date: 06/13/2023 CLINICAL DATA:  Metastatic evaluation, nausea and vomiting, abdominal pain and lethargy, sclerotic lesions on recent CT concerning for bony metastases EXAM: CT CHEST, ABDOMEN, AND PELVIS WITH CONTRAST TECHNIQUE: Multidetector CT imaging of the chest, abdomen and pelvis was performed following  the standard protocol during bolus administration of intravenous contrast. RADIATION DOSE REDUCTION: This exam was performed according to the departmental dose-optimization program which includes automated exposure control, adjustment of the mA and/or kV according to patient size and/or use of iterative reconstruction technique. CONTRAST:  80mL OMNIPAQUE  IOHEXOL  300 MG/ML  SOLN COMPARISON:  06/12/2023, 09/18/2021 FINDINGS: CT CHEST FINDINGS Cardiovascular: The heart is unremarkable without pericardial effusion. No evidence of thoracic aortic aneurysm or dissection. Mediastinum/Nodes: Thyroid, trachea, and esophagus are unremarkable. There is a small hiatal hernia. No pathologic mediastinal, hilar, or axillary adenopathy. Lungs/Pleura: No acute airspace disease, effusion, or pneumothorax. Central airways are patent. Musculoskeletal: Numerous sclerotic foci are again noted within the axial and appendicular skeleton, most pronounced within the sternum, thoracic spine, thoracic cage, and left humeral head. 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 There are no acute displaced fractures. There is asymmetric enhancing soft tissue within the upper and outer aspects of the left breast, contiguous with the skin thickening and retraction in the left axilla seen on prior study. Correlation with mammographic evaluation is recommended to assess for underlying breast cancer. Reconstructed images demonstrate no additional findings. CT ABDOMEN PELVIS FINDINGS Hepatobiliary: There is a 2.2 cm hemangioma seen within the superior left lobe liver reference image 44/2. No other  focal liver abnormalities. The gallbladder is unremarkable. No biliary duct dilation. Pancreas: Unremarkable. No pancreatic ductal dilatation or surrounding inflammatory changes. Spleen: Normal in size without focal abnormality. Adrenals/Urinary Tract: Enlarged edematous left kidney is again identified, with stable hydronephrosis. Heterogeneous enhancement of  the left renal parenchyma is noted, especially on delayed images. There is delayed excretion of contrast. No radiopaque calculus or obstructing soft tissue mass is identified on this exam. The right kidney enhances normally. The bladder is moderately distended, with normal right ureteral jet identified on delayed imaging. The left ureteral jet is not visualized. Right adrenal is unremarkable. There is nonspecific thickening of the left adrenal gland, measuring 2 cm in thickness with attenuation of 75 HU. Stomach/Bowel: No bowel obstruction or ileus. Normal appendix right lower quadrant. No bowel wall thickening or inflammatory change. Small hiatal hernia. Vascular/Lymphatic: Aortic atherosclerosis. No enlarged abdominal or pelvic lymph nodes. Reproductive: Prior hysterectomy.  No adnexal masses. Other: Trace pelvic free fluid. No free intraperitoneal gas. No abdominal wall hernia. Musculoskeletal: Multiple sclerotic lesions are again noted throughout the axial and appendicular skeleton, most prominent within the lumbosacral spine and iliac bones, consistent with sclerotic metastases. Reconstructed images demonstrate no additional findings. IMPRESSION: 1. Enlarged edematous left kidney, with heterogeneous enhancement and delayed excretion of contrast, consistent with pyelonephritis. No evidence of renal abscess. 2. Persistent left-sided hydronephrosis and hydroureter likely due to pyelonephritis. No radiopaque calculus or obstructing mass identified. If further evaluation is desired, retrograde evaluation could be considered when clinical situation permits. 3. Diffuse sclerotic lesions throughout the axial and appendicular skeleton, unchanged, consistent with sclerotic metastases. 4. Abnormal soft tissue density within the upper-outer left breast, contiguous with the skin thickening and retraction in the left axilla reported previously. Given sclerotic metastases, underlying breast cancer is a concern. Correlation  with mammographic evaluation recommended. 5. Trace pelvic free fluid, nonspecific. 6.  Aortic Atherosclerosis (ICD10-I70.0). Electronically Signed   By: Bobbye Burrow M.D.   On: 06/13/2023 16:25   CT CHEST ABDOMEN PELVIS WO CONTRAST Result Date: 06/13/2023 CLINICAL DATA:  Sepsis EXAM: CT CHEST, ABDOMEN AND PELVIS WITHOUT CONTRAST TECHNIQUE: Multidetector CT imaging of the chest, abdomen and pelvis was performed following the standard protocol without IV contrast. RADIATION DOSE REDUCTION: This exam was performed according to the departmental dose-optimization program which includes automated exposure control, adjustment of the mA and/or kV according to patient size and/or use of iterative reconstruction technique. COMPARISON:  Same day chest radiograph; CT chest 09/18/2021 FINDINGS: CT CHEST FINDINGS Cardiovascular: No pericardial effusion. Normal caliber thoracic aorta. Mediastinum/Nodes: Trachea and esophagus are unremarkable. No thoracic adenopathy. Lungs/Pleura: No focal consolidation, pleural effusion, or pneumothorax. Musculoskeletal: Multiple sclerotic lesions throughout the thoracic vertebral bodies, sternum, and bilateral ribs. For example 12 mm lesion in the mid sternum (series 10/image 74) and 17 x 15 mm lesion in the T8 vertebral body. No pathologic fracture. Partially visualized soft tissue mass in the left axilla with skin retraction measuring approximately 4.0 x 2.3 cm in the axial plane (series 2/image 12). Additional focal soft tissue thickening in the right axilla on series 2/image 16 measuring 7 x 13 mm. These findings are similar to 09/18/2021. CT ABDOMEN PELVIS FINDINGS Hepatobiliary: Unremarkable noncontrast appearance of the liver, gallbladder, and biliary tree. Pancreas: Unremarkable. Spleen: Unremarkable. Adrenals/Urinary Tract: The adrenal glands are poorly visualized without IV contrast. Unremarkable right kidney. Mild right hydronephrosis. No obstructing stone. Unremarkable bladder.  Stomach/Bowel: Normal caliber large and small bowel. No bowel wall thickening. Stomach and appendix are within normal limits. Vascular/Lymphatic: Aortic  atherosclerosis. No enlarged abdominal or pelvic lymph nodes. Reproductive: Hysterectomy.  No adnexal mass. Other: No free intraperitoneal fluid or air. Musculoskeletal: No acute fracture. Numerous sclerotic lesions are present throughout the axial and visualized appendicular skeleton. For example a 13 mm sclerotic lesion in the L3 vertebral body IMPRESSION: 1. Numerous sclerotic lesions throughout the axial and visualized appendicular skeleton new compared 09/18/2021, suggestive of metastatic disease of unknown primary. 2. Edematous left kidney with left hydronephrosis new since 2023. No radiopaque stone. Given findings concerning for osseous metastases, CT abdomen pelvis with IV contrast and including a renal excretory phase is recommended for further evaluation. 3. Partially visualized soft tissue mass in the left axilla with skin retraction measuring approximately 4.0 x 2.3 cm in the axial plane. Additional focal soft tissue thickening in the right axilla measuring 7 x 13 mm. These findings are similar to 09/18/2021 differential considerations include infectious/inflammatory process such as hydradenitis suppurativa or malignancy. Clinical and if appropriate histologic correlation is recommended. 4. Aortic Atherosclerosis (ICD10-I70.0). Electronically Signed   By: Rozell Cornet M.D.   On: 06/13/2023 00:21   DG Chest Port 1 View Result Date: 06/12/2023 CLINICAL DATA:  Questionable sepsis-evaluate for abnormality EXAM: PORTABLE CHEST 1 VIEW COMPARISON:  Radiographs 04/10/2021 FINDINGS: Stable cardiomediastinal silhouette. No focal consolidation, pleural effusion, or pneumothorax. No displaced rib fractures. IMPRESSION: No active disease. Electronically Signed   By: Rozell Cornet M.D.   On: 06/12/2023 23:14    ASSESSMENT & PLAN:  ***  All of the  patients questions were answered with apparent satisfaction. The patient knows to call the clinic with any problems, questions or concerns.  I spent {CHL ONC TIME VISIT - WUJWJ:[id number]} counseling the patient face to face. The total time spent in the appointment was {CHL ONC TIME VISIT - OZHYQ:[id number]} and more than 50% was on counseling and direct patient cares.    Jacquelyn Matt MD MS AAHIVMS [facility] Hematology/Oncology Physician [facility]  (Office):       [phone] (Work cell):  [phone] (Fax):           [phone]  06/14/2023 12:53 PM

## 2023-06-15 ENCOUNTER — Inpatient Hospital Stay (HOSPITAL_COMMUNITY): Payer: MEDICAID

## 2023-06-15 DIAGNOSIS — N6321 Unspecified lump in the left breast, upper outer quadrant: Secondary | ICD-10-CM

## 2023-06-15 LAB — BASIC METABOLIC PANEL WITH GFR
Anion gap: 12 (ref 5–15)
BUN: <5 mg/dL — ABNORMAL LOW (ref 6–20)
CO2: 20 mmol/L — ABNORMAL LOW (ref 22–32)
Calcium: 8.1 mg/dL — ABNORMAL LOW (ref 8.9–10.3)
Chloride: 103 mmol/L (ref 98–111)
Creatinine, Ser: 0.85 mg/dL (ref 0.44–1.00)
GFR, Estimated: >60 mL/min (ref >60)
Glucose, Bld: 104 mg/dL — ABNORMAL HIGH (ref 70–99)
Potassium: 2.9 mmol/L — ABNORMAL LOW (ref 3.5–5.1)
Sodium: 135 mmol/L (ref 135–145)

## 2023-06-15 LAB — CBC
HCT: 32.3 % — ABNORMAL LOW (ref 36.0–46.0)
Hemoglobin: 10.7 g/dL — ABNORMAL LOW (ref 12.0–15.0)
MCH: 33.5 pg (ref 26.0–34.0)
MCHC: 33.1 g/dL (ref 30.0–36.0)
MCV: 101.3 fL — ABNORMAL HIGH (ref 80.0–100.0)
Platelets: 321 10*3/uL (ref 150–400)
RBC: 3.19 MIL/uL — ABNORMAL LOW (ref 3.87–5.11)
RDW: 12.4 % (ref 11.5–15.5)
WBC: 12.5 10*3/uL — ABNORMAL HIGH (ref 4.0–10.5)
nRBC: 0 % (ref 0.0–0.2)

## 2023-06-15 LAB — CULTURE, BLOOD (ROUTINE X 2): Special Requests: ADEQUATE

## 2023-06-15 LAB — PROCALCITONIN: Procalcitonin: 8.83 ng/mL

## 2023-06-15 MED ORDER — POTASSIUM CHLORIDE 20 MEQ PO PACK
60.0000 meq | PACK | Freq: Once | ORAL | Status: AC
  Administered 2023-06-15: 60 meq via ORAL
  Filled 2023-06-15: qty 3

## 2023-06-15 MED ORDER — GADOBUTROL 1 MMOL/ML IV SOLN
5.0000 mL | Freq: Once | INTRAVENOUS | Status: AC | PRN
Start: 2023-06-15 — End: 2023-06-15
  Administered 2023-06-15: 5 mL via INTRAVENOUS

## 2023-06-15 NOTE — Progress Notes (Signed)
   06/15/23 0426  Assess: MEWS Score  Temp 100.1 F (37.8 C)  BP 110/64  MAP (mmHg) 78  Pulse Rate (!) 108  Resp 19  SpO2 94 %  Assess: MEWS Score  MEWS Temp 0  MEWS Systolic 0  MEWS Pulse 1  MEWS RR 0  MEWS LOC 0  MEWS Score 1  MEWS Score Color Green  Assess: if the MEWS score is Yellow or Red  Were vital signs accurate and taken at a resting state? Yes  Does the patient meet 2 or more of the SIRS criteria? No  MEWS guidelines implemented  No, previously yellow, continue vital signs every 4 hours  Assess: SIRS CRITERIA  SIRS Temperature  0  SIRS Respirations  0  SIRS Pulse 1  SIRS WBC 0  SIRS Score Sum  1

## 2023-06-15 NOTE — Progress Notes (Signed)
 [facility]                                                                              [name], is a 58 y.o. female, DOB - [birth year], WFU:[id number] Admit date - 06/12/2023    Outpatient Primary MD for the Elizabeth Mason is Elizabeth Mason, No Pcp Per  LOS - 2  days  Chief Complaint  Elizabeth Mason presents with   Code Sepsis       Brief summary   Elizabeth Mason is a 58 year old female with depression, alcohol abuse presented with shaking chills, intermittent abdominal pain with nausea, vomiting.  Elizabeth Mason reported shaking chills for the past 5 days prior to admission, loss of appetite, pain in the mid abdomen, intermittent, occasional dysuria. In ED, temp 103.2 F, RR 22-25, pulse 140, BP 159/77, O2 sats 95% on room air.  WBCs 18.8    Assessment & Plan    Principal Problem: Sepsis likely due to UTI, pyelonephritis, ?  Malignancy of unknown primary, POA - Elizabeth Mason met sepsis criteria on admission with fevers, tachypnea, tachycardia, leukocytosis, source unclear, possibly pyelonephritis - Has left axillary lesion, chest x-ray shows no focal solid lesion, UA positive for UTI - Blood culture showed staph warneri, likely contaminant - Urine culture showed multiple species  - procalcitonin 18.87 on mission, repeated today improving, 8.83 - Currently on IV vancomycin  and cefepime .  If vancomycin  discontinued, will place on doxycycline due to drainage from the left axillary area to prevent superinfection  Active Problems: Left renal pyelonephritis with hydronephrosis - CT showed edematous left kidney with left hydronephrosis new since 2023, no stone  - Repeat CT with contrast showed left pyelonephritis, no renal abscess.  Persistent left-sided hydronephrosis and hydroureter likely due to pyelonephritis, no calculus or obstructing mass identified. - Currently on IV vancomycin  and cefepime   Mass of axilla, bilateral, sclerotic lesions, likely metastatic disease of unknown primary - Elizabeth Mason  has a soft tissue mass in the left axilla 4.0 x 2.3 cm, additional soft tissue thickening in the right axilla 7 x 13 mm - IR consulted for biopsy, recommended ultrasound of the axilla and core biopsy planned by IR on 5/27. -Limited LUE axillary US  showed soft tissue mass in left axillary 2.9 cm, may represent metastatic disease versus LAD. - CT chest abd pelvis showed abnormal soft tissue density in the left upper outer breast, given sclerotic metastasis underlying breast cancer is a concern. - Seen by cardiology, Dr. Salomon Cree, appreciate recommendations.  Vitamin D  level ordered. - Follow MRI of the brain, CA 15-3, CA 27-29   Hypokalemia - Potassium 2.9, replaced    Alcohol use -Currently stable, not in any acute withdrawals, -Continue CIWA with Ativan     MDD (major depressive disorder) -Currently stable  Acute hypoxic respiratory failure - No acute chest pain, shortness of breath, Elizabeth Mason was placed on 3 L O2 via  in ED -   flu, RSV, COVID-negative.  Chest x-ray negative for any pneumonia - Resolved, on room air  Underweight Estimated body mass index is 16.65 kg/m as calculated from the following:   Height as of this encounter: 5\' 2"  (1.575 m).   Weight as of  this encounter: 41.3 kg.  Code Status: Full code DVT Prophylaxis:  enoxaparin  (LOVENOX ) injection 30 mg Start: 06/13/23 1000   Level of Care: Level of care: Progressive Family Communication: Updated Elizabeth Mason Disposition Plan:      Remains inpatient appropriate: Ongoing workup   Procedures:    Consultants:   Intervention radiology Oncology Antimicrobials:   Anti-infectives (From admission, onward)    Start     Dose/Rate Route Frequency Ordered Stop   06/14/23 1400  vancomycin (VANCOCIN) IVPB 1000 mg/200 mL premix        1,000 mg 200 mL/hr over 60 Minutes Intravenous Every 36 hours 06/13/23 0216     06/13/23 1200  ceFEPIme (MAXIPIME) 2 g in sodium chloride  0.9 % 100 mL IVPB        2 g 200 mL/hr over 30 Minutes  Intravenous Every 12 hours 06/13/23 0103     06/13/23 1100  metroNIDAZOLE (FLAGYL) IVPB 500 mg  Status:  Discontinued        500 mg 100 mL/hr over 60 Minutes Intravenous Every 12 hours 06/13/23 0053 06/13/23 1848   06/12/23 2300  ceFEPIme (MAXIPIME) 2 g in sodium chloride  0.9 % 100 mL IVPB        2 g 200 mL/hr over 30 Minutes Intravenous  Once 06/12/23 2259 06/12/23 2353   06/12/23 2300  metroNIDAZOLE (FLAGYL) IVPB 500 mg        500 mg 100 mL/hr over 60 Minutes Intravenous  Once 06/12/23 2259 06/13/23 0123   06/12/23 2300  vancomycin (VANCOCIN) IVPB 1000 mg/200 mL premix        1,000 mg 200 mL/hr over 60 Minutes Intravenous  Once 06/12/23 2259 06/13/23 0701          Medications  enoxaparin  (LOVENOX ) injection  30 mg Subcutaneous Q24H   folic acid   1 mg Oral Daily   LORazepam   0-4 mg Intravenous Q8H   multivitamin with minerals  1 tablet Oral Daily   pneumococcal 20-valent conjugate vaccine  0.5 mL Intramuscular Tomorrow-1000   sodium chloride  flush  3 mL Intravenous Q12H   thiamine   100 mg Oral Daily   Or   thiamine   100 mg Intravenous Daily      Subjective:   [name] was seen and examined today.  No acute complaints, plan for core biopsy tomorrow, no nausea vomiting, fever chills, chest pain or shortness of breath.     Objective:   Vitals:   06/14/23 1345 06/14/23 1922 06/15/23 0139 06/15/23 0426  BP: 125/78 121/81  110/64  Pulse: 85 88 (!) 118 (!) 108  Resp: 18 18  19   Temp: 98.2 F (36.8 C) 97.8 F (36.6 C)  100.1 F (37.8 C)  TempSrc: Oral     SpO2: 98% 100%  94%  Weight:      Height:        Intake/Output Summary (Last 24 hours) at 06/15/2023 1212 Last data filed at 06/15/2023 0849 Gross per 24 hour  Intake 640 ml  Output --  Net 640 ml     Wt Readings from Last 3 Encounters:  06/13/23 41.3 kg  09/18/21 41.3 kg  04/12/21 41.3 kg   Physical Exam General: Alert and oriented x 3, NAD Cardiovascular: S1 S2 clear, RRR.  Respiratory: CTAB, no  wheezing Gastrointestinal: Soft, nontender, nondistended, NBS Ext: no pedal edema bilaterally Neuro: no new deficits Skin: Left axillary area with scarring, skin retraction and drainage Psych: Normal affect      Data Reviewed:  I have  personally reviewed following labs    CBC Lab Results  Component Value Date   WBC 12.5 (H) 06/15/2023   RBC 3.19 (L) 06/15/2023   HGB 10.7 (L) 06/15/2023   HCT 32.3 (L) 06/15/2023   MCV 101.3 (H) 06/15/2023   MCH 33.5 06/15/2023   PLT 321 06/15/2023   MCHC 33.1 06/15/2023   RDW 12.4 06/15/2023   LYMPHSABS 0.4 (L) 06/12/2023   MONOABS 0.0 (L) 06/12/2023   EOSABS 0.0 06/12/2023   BASOSABS 0.0 06/12/2023     Last metabolic panel Lab Results  Component Value Date   NA 135 06/15/2023   K 2.9 (L) 06/15/2023   CL 103 06/15/2023   CO2 20 (L) 06/15/2023   BUN <5 (L) 06/15/2023   CREATININE 0.85 06/15/2023   GLUCOSE 104 (H) 06/15/2023   GFRNONAA >60 06/15/2023   GFRAA >60 08/31/2017   CALCIUM 8.1 (L) 06/15/2023   PROT 7.5 06/12/2023   ALBUMIN 2.9 (L) 06/12/2023   BILITOT 0.6 06/12/2023   ALKPHOS 172 (H) 06/12/2023   AST 17 06/12/2023   ALT 11 06/12/2023   ANIONGAP 12 06/15/2023    CBG (last 3)  No results for input(s): "GLUCAP" in the last 72 hours.    Coagulation Profile: Recent Labs  Lab 06/12/23 2259  INR 1.0     Radiology Studies: I have personally reviewed the imaging studies  US  LT UPPER EXTREM LTD SOFT TISSUE NON VASCULAR Result Date: 06/13/2023 CLINICAL DATA:  Left axillary lymphadenopathy. EXAM: ULTRASOUND LEFT UPPER EXTREMITY LIMITED TECHNIQUE: Ultrasound examination of the upper extremity soft tissues was performed in the area of clinical concern. COMPARISON:  06/13/2023 FINDINGS: A soft tissue mass is noted in the left axilla measuring 2.7 x 1.1 x 2.9 cm with internal vascularity. IMPRESSION: A soft tissue mass in the left axilla measuring up to 2.9 cm with internal vascularity. Findings may represent metastatic  disease versus lymphadenopathy. Correlation with diagnostic mammography is recommended given history of breast cancer. Electronically Signed   By: Wyvonnia Heimlich M.D.   On: 06/13/2023 17:23   CT CHEST ABDOMEN PELVIS W CONTRAST Result Date: 06/13/2023 CLINICAL DATA:  Metastatic evaluation, nausea and vomiting, abdominal pain and lethargy, sclerotic lesions on recent CT concerning for bony metastases EXAM: CT CHEST, ABDOMEN, AND PELVIS WITH CONTRAST TECHNIQUE: Multidetector CT imaging of the chest, abdomen and pelvis was performed following the standard protocol during bolus administration of intravenous contrast. RADIATION DOSE REDUCTION: This exam was performed according to the departmental dose-optimization program which includes automated exposure control, adjustment of the mA and/or kV according to Elizabeth Mason size and/or use of iterative reconstruction technique. CONTRAST:  80mL OMNIPAQUE  IOHEXOL  300 MG/ML  SOLN COMPARISON:  06/12/2023, 09/18/2021 FINDINGS: CT CHEST FINDINGS Cardiovascular: The heart is unremarkable without pericardial effusion. No evidence of thoracic aortic aneurysm or dissection. Mediastinum/Nodes: Thyroid, trachea, and esophagus are unremarkable. There is a small hiatal hernia. No pathologic mediastinal, hilar, or axillary adenopathy. Lungs/Pleura: No acute airspace disease, effusion, or pneumothorax. Central airways are patent. Musculoskeletal: Numerous sclerotic foci are again noted within the axial and appendicular skeleton, most pronounced within the sternum, thoracic spine, thoracic cage, and left humeral head. 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 There are no acute displaced fractures. There is asymmetric enhancing soft tissue within the upper and outer aspects of the left breast, contiguous with the skin thickening and retraction in the left axilla seen on prior study. Correlation with mammographic evaluation is recommended to assess for underlying breast cancer. Reconstructed  images demonstrate no additional findings.  CT ABDOMEN PELVIS FINDINGS Hepatobiliary: There is a 2.2 cm hemangioma seen within the superior left lobe liver reference image 44/2. No other focal liver abnormalities. The gallbladder is unremarkable. No biliary duct dilation. Pancreas: Unremarkable. No pancreatic ductal dilatation or surrounding inflammatory changes. Spleen: Normal in size without focal abnormality. Adrenals/Urinary Tract: Enlarged edematous left kidney is again identified, with stable hydronephrosis. Heterogeneous enhancement of the left renal parenchyma is noted, especially on delayed images. There is delayed excretion of contrast. No radiopaque calculus or obstructing soft tissue mass is identified on this exam. The right kidney enhances normally. The bladder is moderately distended, with normal right ureteral jet identified on delayed imaging. The left ureteral jet is not visualized. Right adrenal is unremarkable. There is nonspecific thickening of the left adrenal gland, measuring 2 cm in thickness with attenuation of 75 HU. Stomach/Bowel: No bowel obstruction or ileus. Normal appendix right lower quadrant. No bowel wall thickening or inflammatory change. Small hiatal hernia. Vascular/Lymphatic: Aortic atherosclerosis. No enlarged abdominal or pelvic lymph nodes. Reproductive: Prior hysterectomy.  No adnexal masses. Other: Trace pelvic free fluid. No free intraperitoneal gas. No abdominal wall hernia. Musculoskeletal: Multiple sclerotic lesions are again noted throughout the axial and appendicular skeleton, most prominent within the lumbosacral spine and iliac bones, consistent with sclerotic metastases. Reconstructed images demonstrate no additional findings. IMPRESSION: 1. Enlarged edematous left kidney, with heterogeneous enhancement and delayed excretion of contrast, consistent with pyelonephritis. No evidence of renal abscess. 2. Persistent left-sided hydronephrosis and hydroureter likely due  to pyelonephritis. No radiopaque calculus or obstructing mass identified. If further evaluation is desired, retrograde evaluation could be considered when clinical situation permits. 3. Diffuse sclerotic lesions throughout the axial and appendicular skeleton, unchanged, consistent with sclerotic metastases. 4. Abnormal soft tissue density within the upper-outer left breast, contiguous with the skin thickening and retraction in the left axilla reported previously. Given sclerotic metastases, underlying breast cancer is a concern. Correlation with mammographic evaluation recommended. 5. Trace pelvic free fluid, nonspecific. 6.  Aortic Atherosclerosis (ICD10-I70.0). Electronically Signed   By: Bobbye Burrow M.D.   On: 06/13/2023 16:25       Jaspreet Bodner M.D. [facility] 06/15/2023, 12:12 PM  Available via Epic secure chat 7am-7pm After 7 pm, please refer to night coverage provider listed on amion.

## 2023-06-16 ENCOUNTER — Inpatient Hospital Stay (HOSPITAL_COMMUNITY): Payer: MEDICAID

## 2023-06-16 LAB — VITAMIN D 25 HYDROXY (VIT D DEFICIENCY, FRACTURES): Vit D, 25-Hydroxy: 12.19 ng/mL — ABNORMAL LOW (ref 30–100)

## 2023-06-16 LAB — BASIC METABOLIC PANEL WITH GFR
Anion gap: 9 (ref 5–15)
BUN: <5 mg/dL — ABNORMAL LOW (ref 6–20)
CO2: 22 mmol/L (ref 22–32)
Calcium: 8.6 mg/dL — ABNORMAL LOW (ref 8.9–10.3)
Chloride: 105 mmol/L (ref 98–111)
Creatinine, Ser: 0.89 mg/dL (ref 0.44–1.00)
GFR, Estimated: >60 mL/min (ref >60)
Glucose, Bld: 95 mg/dL (ref 70–99)
Potassium: 4.3 mmol/L (ref 3.5–5.1)
Sodium: 136 mmol/L (ref 135–145)

## 2023-06-16 LAB — CBC
HCT: 32.3 % — ABNORMAL LOW (ref 36.0–46.0)
Hemoglobin: 10.4 g/dL — ABNORMAL LOW (ref 12.0–15.0)
MCH: 33.7 pg (ref 26.0–34.0)
MCHC: 32.2 g/dL (ref 30.0–36.0)
MCV: 104.5 fL — ABNORMAL HIGH (ref 80.0–100.0)
Platelets: 324 10*3/uL (ref 150–400)
RBC: 3.09 MIL/uL — ABNORMAL LOW (ref 3.87–5.11)
RDW: 12.6 % (ref 11.5–15.5)
WBC: 14.1 10*3/uL — ABNORMAL HIGH (ref 4.0–10.5)
nRBC: 0 % (ref 0.0–0.2)

## 2023-06-16 MED ORDER — FENTANYL CITRATE (PF) 100 MCG/2ML IJ SOLN
INTRAMUSCULAR | Status: AC
Start: 2023-06-16 — End: ?
  Filled 2023-06-16: qty 2

## 2023-06-16 MED ORDER — MIDAZOLAM HCL 2 MG/2ML IJ SOLN
INTRAMUSCULAR | Status: AC
  Filled 2023-06-16: qty 2

## 2023-06-16 MED ORDER — MIDAZOLAM HCL 2 MG/2ML IJ SOLN
INTRAMUSCULAR | Status: AC | PRN
  Administered 2023-06-16 (×2): 1 mg via INTRAVENOUS

## 2023-06-16 MED ORDER — FENTANYL CITRATE (PF) 100 MCG/2ML IJ SOLN
INTRAMUSCULAR | Status: AC | PRN
  Administered 2023-06-16 (×2): 50 ug via INTRAVENOUS

## 2023-06-16 MED ORDER — VITAMIN D (ERGOCALCIFEROL) 1.25 MG (50000 UNIT) PO CAPS
50000.0000 [IU] | ORAL_CAPSULE | ORAL | Status: DC
  Administered 2023-06-16: 50000 [IU] via ORAL
  Filled 2023-06-16: qty 1

## 2023-06-16 MED ORDER — LIDOCAINE HCL 1 % IJ SOLN
INTRAMUSCULAR | Status: AC
  Filled 2023-06-16: qty 20

## 2023-06-16 NOTE — Plan of Care (Signed)
 Capacity Evaluation 06/16/2023 today  Patient  does not appear to have the capacity to make an informed decision to agree to or refuse treatment.    Patient has been told several times in the past during admission in March 2023 at [facility], subsequently again during ED visit on 09/18/2021 about the left axillary mass with high suspicion for malignancy, was recommended mammogram, biopsy and outpatient follow-up with oncology.  Patient never followed any recommendations, she did not pick up medications from the pharmacy after admission at [facility] in March 2023. Patient does not appear to understand the existence of the illness, possibility of malignancy and why the recommendations were made.  Per daughter, Ms. [name] they have a strong family history of cancers especially in the women including herself (daughter ) and patient chose to ignore the recommendations after the inpatient admissions or ER visit.   The patient lacks capacity for the following reason(s): Need all 4   - Patient is unable to express a choice to either accept or refuse medications.     - Patient lacks an understanding of the nature of the medication, its risks, benefits, and/or alternatives related to   - Please see above.  Lack of understanding of the gravity of her illness, malignancy   - Patient lacks an appreciation of the nature of his/her current circumstances or psychiatric condition and the decision to treat the condition with medication because   - Patient is not able to understand the existence of an illness requiring ongoing treatment and how treatment might be of benefit     - Patient is unable to rationally manipulate information to make an informed decision due to  - Severe medical noncompliance unclear if it is due to cognitive deficit     Niraj Kudrna M.D.  [facility] 06/16/2023, 12:40 PM

## 2023-06-16 NOTE — Procedures (Signed)
 Interventional Radiology Procedure Note  Procedure: Ultrasound guided left axillary mass biopsy   Findings: Please refer to procedural dictation for full description. 18 ga core x2.   Complications: None immediate  Estimated Blood Loss: < 5 ml  Recommendations: Follow Pathology results.   Creasie Doctor, MD

## 2023-06-16 NOTE — Progress Notes (Addendum)
 [facility]                                                                              Elizabeth Mason, is a 58 y.o. female, DOB - 01-May-1965, GLO:[id number] Admit date - 06/12/2023    Outpatient Primary MD for the patient is Patient, No Pcp Per  LOS - 3  days  Chief Complaint  Patient presents with   Code Sepsis       Brief summary   Patient is a 58 year old female with depression, alcohol abuse presented with shaking chills, intermittent abdominal pain with nausea, vomiting.  Patient reported shaking chills for the past 5 days prior to admission, loss of appetite, pain in the mid abdomen, intermittent, occasional dysuria. In ED, temp 103.2 F, RR 22-25, pulse 140, BP 159/77, O2 sats 95% on room air.  WBCs 18.8    Assessment & Plan    Principal Problem: Sepsis likely due to UTI, left renal pyelonephritis with hydronephrosis - Patient met sepsis criteria on admission with fevers, tachypnea, tachycardia, leukocytosis, source unclear, possibly pyelonephritis - Has left axillary lesion, chest x-ray shows no focal solid lesion, UA positive for UTI, urine culture showed multiple species. - Blood culture showed staph warneri, likely contaminant, repeat blood cultures NTD   - CT showed left pyelonephritis, no renal abscess. Persistent left-sided hydronephrosis and hydroureter likely due to pyelonephritis, no calculus or obstructing mass identified. - procalcitonin 18.87 -> 8.83, improving - Currently on IV vancomycin and cefepime.  If vancomycin discontinued, will place on doxycycline due to drainage from the left axillary area to prevent superinfection  Mass of axilla, bilateral, sclerotic lesions, likely metastatic disease of unknown primary - Patient has a soft tissue mass in the left axilla 4.0 x 2.3 cm, additional soft tissue thickening in the right axilla 7 x 13 mm - Limited LUE axillary US  showed soft tissue mass in left axillary 2.9 cm, may represent metastatic  disease versus LAD. - CT chest abd pelvis showed abnormal soft tissue density in the left upper outer breast, given sclerotic metastasis underlying breast cancer is a concern. -Plan for biopsy today by IR - Patient has been seen by oncology, Dr.Kale, recommended MRI brain- negative for any mets, follow tumor markers   Hypokalemia -Replace as needed    Alcohol use -Currently stable, not in any acute withdrawals, -Continue CIWA with Ativan     MDD (major depressive disorder) -Per daughter, patient had [facility] admission in 03/2021, was recommended fluoxetine  and trazodone  however patient never pick up the medications  Acute hypoxic respiratory failure - No acute chest pain, shortness of breath, patient was placed on 3 L O2 via [place] in ED - Flu, RSV, COVID-negative.  Chest x-ray negative for any pneumonia - Resolved, on room air -counseled on smoking cessation   Severe vitamin D deficiency - Placed on vitamin D 50,000 units q. 7 days  Medical noncompliance - Patient was noted to have axillary mass during previous admission in March 2023 at [facility], family medicine teaching service and was recommended mammogram and outpatient follow-up with oncology however she did not.   Subsequently she was seen in the ER on 09/18/2021  for "abscess in the left armpit", it was again noted as a possible malignancy and EDP had extensive conversation with the patient including life threatening nature, however she did not follow any of the recommendations - Per daughter, patient was admitted at [facility] in 03/2021 was recommended fluoxetine  and trazodone  however patient never picked up the medications. - Patient's daughter feels that patient is not capable of making her medical decisions and I agree with her.  Requested TOC as patient's daughter wants to apply for Medicaid and HCPOA for medical decisions. - Please see separate note for capacity evaluation.    Underweight Estimated body mass index is 16.65 kg/m as calculated  from the following:   Height as of this encounter: 5\' 2"  (1.575 m).   Weight as of this encounter: 41.3 kg.  Code Status: Full code DVT Prophylaxis:  enoxaparin  (LOVENOX ) injection 30 mg Start: 06/13/23 1000   Level of Care: Level of care: Progressive Family Communication: Updated patient's daughter [name] on the phone Disposition Plan:      Remains inpatient appropriate: Daughter is requesting that she be the healthcare power of attorney for medical decisions and patient not be discharged until biopsy results and treatment plan.  Procedures:    Consultants:   Intervention radiology Oncology   Antimicrobials:   Anti-infectives (From admission, onward)    Start     Dose/Rate Route Frequency Ordered Stop   06/14/23 1400  vancomycin (VANCOCIN) IVPB 1000 mg/200 mL premix        1,000 mg 200 mL/hr over 60 Minutes Intravenous Every 36 hours 06/13/23 0216     06/13/23 1200  ceFEPIme (MAXIPIME) 2 g in sodium chloride  0.9 % 100 mL IVPB        2 g 200 mL/hr over 30 Minutes Intravenous Every 12 hours 06/13/23 0103     06/13/23 1100  metroNIDAZOLE (FLAGYL) IVPB 500 mg  Status:  Discontinued        500 mg 100 mL/hr over 60 Minutes Intravenous Every 12 hours 06/13/23 0053 06/13/23 1848   06/12/23 2300  ceFEPIme (MAXIPIME) 2 g in sodium chloride  0.9 % 100 mL IVPB        2 g 200 mL/hr over 30 Minutes Intravenous  Once 06/12/23 2259 06/12/23 2353   06/12/23 2300  metroNIDAZOLE (FLAGYL) IVPB 500 mg        500 mg 100 mL/hr over 60 Minutes Intravenous  Once 06/12/23 2259 06/13/23 0123   06/12/23 2300  vancomycin (VANCOCIN) IVPB 1000 mg/200 mL premix        1,000 mg 200 mL/hr over 60 Minutes Intravenous  Once 06/12/23 2259 06/13/23 0701          Medications  enoxaparin  (LOVENOX ) injection  30 mg Subcutaneous Q24H   folic acid   1 mg Oral Daily   LORazepam   0-4 mg Intravenous Q8H   multivitamin with minerals  1 tablet Oral Daily   sodium chloride  flush  3 mL Intravenous Q12H    thiamine   100 mg Oral Daily   Or   thiamine   100 mg Intravenous Daily      Subjective:   Elizabeth Mason was seen and examined today.  Patient has no complaints, awaiting for core biopsy today.  No nausea vomiting, fever chills, chest pain or shortness of breath.    Objective:   Vitals:   06/15/23 0426 06/15/23 1257 06/15/23 2011 06/16/23 0254  BP: 110/64 102/71 117/71 115/66  Pulse: (!) 108 92 90 86  Resp: 19  15 14  Temp: 100.1 F (37.8 C) 97.9 F (36.6 C) (!) 97.5 F (36.4 C) 98.1 F (36.7 C)  TempSrc:  Oral Oral Oral  SpO2: 94% 99% 100% 99%  Weight:      Height:        Intake/Output Summary (Last 24 hours) at 06/16/2023 1200 Last data filed at 06/16/2023 1015 Gross per 24 hour  Intake 240 ml  Output --  Net 240 ml     Wt Readings from Last 3 Encounters:  06/13/23 41.3 kg  09/18/21 41.3 kg  04/12/21 41.3 kg    Physical Exam General: Alert and oriented x 3, NAD Cardiovascular: S1 S2 clear, RRR.  Respiratory: CTAB Gastrointestinal: Soft, nontender, nondistended, NBS Ext: no pedal edema bilaterally Neuro: no new deficits Skin: Left axillary area with scarring, skin retraction, drainage Psych: Normal affect      Data Reviewed:  I have personally reviewed following labs    CBC Lab Results  Component Value Date   WBC 14.1 (H) 06/16/2023   RBC 3.09 (L) 06/16/2023   HGB 10.4 (L) 06/16/2023   HCT 32.3 (L) 06/16/2023   MCV 104.5 (H) 06/16/2023   MCH 33.7 06/16/2023   PLT 324 06/16/2023   MCHC 32.2 06/16/2023   RDW 12.6 06/16/2023   LYMPHSABS 0.4 (L) 06/12/2023   MONOABS 0.0 (L) 06/12/2023   EOSABS 0.0 06/12/2023   BASOSABS 0.0 06/12/2023     Last metabolic panel Lab Results  Component Value Date   NA 136 06/16/2023   K 4.3 06/16/2023   CL 105 06/16/2023   CO2 22 06/16/2023   BUN <5 (L) 06/16/2023   CREATININE 0.89 06/16/2023   GLUCOSE 95 06/16/2023   GFRNONAA >60 06/16/2023   GFRAA >60 08/31/2017   CALCIUM 8.6 (L) 06/16/2023   PROT  7.5 06/12/2023   ALBUMIN 2.9 (L) 06/12/2023   BILITOT 0.6 06/12/2023   ALKPHOS 172 (H) 06/12/2023   AST 17 06/12/2023   ALT 11 06/12/2023   ANIONGAP 9 06/16/2023    CBG (last 3)  No results for input(s): "GLUCAP" in the last 72 hours.    Coagulation Profile: Recent Labs  Lab 06/12/23 2259  INR 1.0     Radiology Studies: I have personally reviewed the imaging studies  MR BRAIN W WO CONTRAST Result Date: 06/15/2023 CLINICAL DATA:  Invasive breast carcinoma staging EXAM: MRI HEAD WITHOUT AND WITH CONTRAST TECHNIQUE: Multiplanar, multiecho pulse sequences of the brain and surrounding structures were obtained without and with intravenous contrast. CONTRAST:  5mL GADAVIST GADOBUTROL 1 MMOL/ML IV SOLN COMPARISON:  None Available. FINDINGS: Brain: No acute infarct, mass effect or extra-axial collection. Remote microhemorrhage in the right temporal lobe. Normal white matter signal, parenchymal volume and CSF spaces. The midline structures are normal. There is no abnormal contrast enhancement. Vascular: Normal flow voids. Skull and upper cervical spine: Normal calvarium and skull base. Visualized upper cervical spine and soft tissues are normal. Sinuses/Orbits:Right mastoid fluid. Paranasal sinuses are clear. Normal orbits. IMPRESSION: No intracranial metastatic disease. Electronically Signed   By: Juanetta Nordmann M.D.   On: 06/15/2023 19:22       Kainan Patty M.D. [facility] 06/16/2023, 12:00 PM  Available via Epic secure chat 7am-7pm After 7 pm, please refer to night coverage provider listed on amion.

## 2023-06-16 NOTE — TOC Initial Note (Signed)
 Transition of Care [facility]) - Initial/Assessment Note    Patient Details  Name: Elizabeth Mason MRN: [id number] Date of Birth: 03-02-1965  Transition of [facility]) CM/SW Contact:    Gertha Ku, LCSW Phone Number: 06/16/2023, 3:50 PM  Clinical Narrative:                 CSW) met with the pt and her daughter to discuss discharge planning. The pt's daughter reported that she has initiated the pt's Medicaid application online. She also expressed interest in discussing healthcare power of attorney.   CSW explained that the chaplain typically completes HCPOA documentation, and the physician will need to place a consult.  Pt reports independent at baseline , she stated she has no DME needs. Pt reports her family will provided transportation upon d/c.   Expected Discharge Plan: Home/Self Care Barriers to Discharge: Continued Medical Work up   Patient Goals and CMS Choice            Expected Discharge Plan and Services       Living arrangements for the past 2 months: Single Family Home                                      Prior Living Arrangements/Services Living arrangements for the past 2 months: Single Family Home Lives with:: Self          Need for Family Participation in Patient Care: No (Comment) Care giver support system in place?: Yes (comment)      Activities of Daily Living   ADL Screening (condition at time of admission) Independently performs ADLs?: Yes (appropriate for developmental age) Is the patient deaf or have difficulty hearing?: No Does the patient have difficulty seeing, even when wearing glasses/contacts?: No Does the patient have difficulty concentrating, remembering, or making decisions?: No  Permission Sought/Granted                  Emotional Assessment Appearance:: Appears stated age Attitude/Demeanor/Rapport: Gracious Affect (typically observed): Accepting Orientation: : Oriented to Place, Oriented to  Time,  Oriented to Self, Oriented to Situation      Admission diagnosis:  Alcohol abuse [F10.10] SIRS (systemic inflammatory response syndrome) (HCC) [R65.10] Open wound of left axillary region with complication, initial encounter [S41.102A] Sepsis, due to unspecified organism, unspecified whether acute organ dysfunction present [facility]) [A41.9] Patient Active Problem List   Diagnosis Date Noted   Mass of upper outer quadrant of left breast 06/15/2023   SIRS (systemic inflammatory response syndrome) (HCC) 06/13/2023   Hypokalemia 06/13/2023   Hydronephrosis of left kidney 06/13/2023   Metastasis to bone (HCC) 06/13/2023   Open wound of left axillary region with complication 06/13/2023   Sepsis (HCC) 06/13/2023   BMI less than 19,adult 04/14/2021   MDD (major depressive disorder) 04/12/2021   Intentional overdose (HCC) 04/10/2021   Tobacco use 04/10/2021   Alcohol abuse 04/10/2021   Mass of axilla, bilateral 04/10/2021   Suicide attempt [facility])    PCP:  Patient, No Pcp Per Pharmacy:   [id number] [place], [place] - [address] AT [facility] [address] [facility] [address] [place] [address] Phone: [phone] Fax: [phone]  [id number] [place], [place] - [address] AT [facility] [address] [place] [address] Phone: [phone] Fax: [phone]     Social Drivers of Health (  SDOH) Social History: SDOH Screenings   Food Insecurity: No Food Insecurity (06/13/2023)  Housing: Low Risk  (06/13/2023)  Transportation Needs: No Transportation Needs (06/13/2023)  Utilities: Not At Risk (06/13/2023)  Tobacco Use: High Risk (06/13/2023)   SDOH Interventions:     Readmission Risk Interventions     No data to display

## 2023-06-16 NOTE — Progress Notes (Signed)
 Pharmacy Antibiotic Note  Elizabeth Mason is a 58 y.o. female admitted on 06/12/2023 with chronic appearing left axillary wound and sepsis.  Pharmacy has been consulted for vancomycin  dosing.     Plan: Continue vancomycin  1gm IV q36h (AUC 488.7, Scr 0.8, TBW) Follow renal function, cultures and clinical course  Height: 5\' 2"  (157.5 cm) Weight: 41.3 kg (91 lb 0.8 oz) IBW/kg (Calculated) : 50.1  Temp (24hrs), Avg:97.8 F (36.6 C), Min:97.5 F (36.4 C), Max:98.1 F (36.7 C)  Recent Labs  Lab 06/12/23 2259 06/12/23 2308 06/13/23 0450 06/14/23 0418 06/15/23 0414 06/16/23 0350  WBC 5.0  --  18.8* 11.1* 12.5* 14.1*  CREATININE 0.67  --  0.78 0.74 0.85 0.89  LATICACIDVEN  --  1.9  --   --   --   --     Estimated Creatinine Clearance: 44.9 mL/min (by C-G formula based on SCr of 0.89 mg/dL).    No Known Allergies  Antimicrobials this admission: 5/23 cefepime  >> 5/23 flagyl  >> 5/24 5/24 vancomycin  >>   Dose adjustments this admission:   Microbiology results: 5/23 BCx: both sets of 1 bottle GPC (staph species), other bottle is no growth 5/23 UCx: mult species 5/24: Cdiff neg 5/25 BCx: NGTD    [place]elinas, PharmD, BCPS 06/16/2023 1:27 PM

## 2023-06-16 NOTE — Progress Notes (Signed)
   06/16/23 1400  Spiritual Encounters  Type of Visit Initial  Care provided to: Pt and family  Referral source Patient request;Family  Reason for visit Urgent spiritual support  OnCall Visit No  Spiritual Framework  Presenting Themes Meaning/purpose/sources of inspiration;Caregiving needs  Patient Stress Factors Health changes;Major life changes  Family Stress Factors Family relationships;Financial concerns;Major life changes  Interventions  Spiritual Care Interventions Made Established relationship of care and support;Compassionate presence;Decision-making support/facilitation;Self-care teaching;Encouragement   Chaplain met with patient and family member at bedside.  Rebekha's relative wishes to be diegnated as Chief Technology Officer - she informed the Chaplain that she has her own Notary who will be stopping by later this afternoon or evening.  Chaplain did AD education and went over material and needed signatures and discussed details with relative in the presence of patient.   Follow up will be handled.

## 2023-06-17 ENCOUNTER — Inpatient Hospital Stay (HOSPITAL_COMMUNITY): Payer: MEDICAID

## 2023-06-17 LAB — CANCER ANTIGEN 27.29: CA 27.29: 193.3 U/mL — ABNORMAL HIGH (ref 0.0–38.6)

## 2023-06-17 LAB — CANCER ANTIGEN 15-3: CA 15-3: 158 U/mL — ABNORMAL HIGH (ref 0.0–25.0)

## 2023-06-17 MED ORDER — LORAZEPAM 2 MG/ML IJ SOLN
1.0000 mg | INTRAMUSCULAR | Status: DC | PRN
  Administered 2023-06-17 (×2): 1 mg via INTRAVENOUS
  Filled 2023-06-17 (×2): qty 1

## 2023-06-17 MED ORDER — LORAZEPAM 1 MG PO TABS: 1.0000 mg | ORAL_TABLET | ORAL | Status: DC | PRN

## 2023-06-17 MED ORDER — SODIUM CHLORIDE 0.9 % IV SOLN
2.0000 g | Freq: Every day | INTRAVENOUS | Status: DC
  Administered 2023-06-17: 2 g via INTRAVENOUS
  Filled 2023-06-17: qty 20

## 2023-06-17 NOTE — Progress Notes (Signed)
 PROGRESS NOTE    Elizabeth Mason  RUE:[id number] DOB: 04-09-1965 DOA: 06/12/2023 PCP: Patient, No Pcp Per  Chief Complaint  Patient presents with   Code Sepsis    Brief Narrative:   58 year old female with depression, alcohol abuse presented with shaking chills, intermittent abdominal pain with nausea, vomiting. Patient reported shaking chills for the past 5 days prior to admission, loss of appetite, pain in the mid abdomen, intermittent, occasional dysuria.   Admitted with sepsis due to pyelo.  Found to have metastatic breast cancer.  Assessment & Plan:   Principal Problem:   SIRS (systemic inflammatory response syndrome) (HCC) Active Problems:   Alcohol abuse   Mass of axilla, bilateral   MDD (major depressive disorder)   Hypokalemia   Hydronephrosis of left kidney   Metastasis to bone [facility])   Open wound of left axillary region with complication   Sepsis (HCC)   Mass of upper outer quadrant of left breast  Poor Medical Literacy Didn't follow up after hospitalization from March 2023 where she was recommended Calypso Hagarty mammogram and outpatient oncology follow up.  Daughter working on healthcare POA paperwork.  I think she'd lack capacity for more complex medical decisions and will need assistance of daughter/family.    Sepsis  Pyelonephritis  Left Sided Hydronephrosis Ruled in with fever, tachypnea, tachycardia, leukocytosis - source urinary Temp this am 100.2, not Latrell Reitan true fever, but also c/o rigors overnight Persistent, but improving leukocytosis CT with findings concerning for pyelonephritis, also L sided hydro and hydroureter due to pyelo Will repeat renal ultrasound - if persistent hydro, will c/s urology D/c vanc.  Will narrow to ceftriaxone.   Staph Warneri Positive Blood Culture Suspected contaminant, in 1 of 2 sets  Metastatic Invasive Ductal Carcinoma of Breast Appreciate oncology assistance CA 15-3 158, CA 27.29 193.3 Hold abx for L axilla, suspect changes related to  malignancy rather than infection  Hypokalemia Improved  Etoh Use No signs of withdrawal   MDD Apparently supposed to start fluoxetine  and trazodone , but didn't pick up meds Will discuss with daughter  Acute Hypoxic Respiratory Failure Now resolved  Vit D Deficiency Replace   Underweight Body mass index is 16.65 kg/m.    DVT prophylaxis: lovenox  Code Status: full Family Communication: daughter at bedside Disposition:   Status is: Inpatient Remains inpatient appropriate because: need for additional inpatient care   Consultants:  IR Oncology  Procedures:  Technically successful ultrasound-guided left axillary mass/lymph node biopsy. 5/27  Antimicrobials:  Anti-infectives (From admission, onward)    Start     Dose/Rate Route Frequency Ordered Stop   06/14/23 1400  vancomycin (VANCOCIN) IVPB 1000 mg/200 mL premix        1,000 mg 200 mL/hr over 60 Minutes Intravenous Every 36 hours 06/13/23 0216     06/13/23 1200  ceFEPIme (MAXIPIME) 2 g in sodium chloride  0.9 % 100 mL IVPB        2 g 200 mL/hr over 30 Minutes Intravenous Every 12 hours 06/13/23 0103     06/13/23 1100  metroNIDAZOLE (FLAGYL) IVPB 500 mg  Status:  Discontinued        500 mg 100 mL/hr over 60 Minutes Intravenous Every 12 hours 06/13/23 0053 06/13/23 1848   06/12/23 2300  ceFEPIme (MAXIPIME) 2 g in sodium chloride  0.9 % 100 mL IVPB        2 g 200 mL/hr over 30 Minutes Intravenous  Once 06/12/23 2259 06/12/23 2353   06/12/23 2300  metroNIDAZOLE (FLAGYL) IVPB 500 mg  500 mg 100 mL/hr over 60 Minutes Intravenous  Once 06/12/23 2259 06/13/23 0123   06/12/23 2300  vancomycin (VANCOCIN) IVPB 1000 mg/200 mL premix        1,000 mg 200 mL/hr over 60 Minutes Intravenous  Once 06/12/23 2259 06/13/23 0701       Subjective: No complaints Daughter at bedside  Objective: Vitals:   06/17/23 0520 06/17/23 0645 06/17/23 1002 06/17/23 1237  BP: 103/66 108/70 111/75 (!) 114/55  Pulse: (!) 118 (!)  112 (!) 110 (!) 101  Resp: 20 15  18   Temp: 100.2 F (37.9 C) 98.6 F (37 C) 98.2 F (36.8 C) 98.4 F (36.9 C)  TempSrc:  Oral Oral Oral  SpO2: 95% 96% 99% 98%  Weight:      Height:        Intake/Output Summary (Last 24 hours) at 06/17/2023 1733 Last data filed at 06/17/2023 1506 Gross per 24 hour  Intake 885.03 ml  Output --  Net 885.03 ml   Filed Weights   06/13/23 0045  Weight: 41.3 kg    Examination:  General exam: thin Respiratory system: unlabored Cardiovascular system: RRR Gastrointestinal system: Abdomen is nondistended, soft and nontender. No cva tenderness Central nervous system: Alert and oriented. No focal neurological deficits. Extremities: no lee    Data Reviewed: I have personally reviewed following labs and imaging studies  CBC: Recent Labs  Lab 06/12/23 2259 06/13/23 0450 06/14/23 0418 06/15/23 0414 06/16/23 0350  WBC 5.0 18.8* 11.1* 12.5* 14.1*  NEUTROABS 4.5  --   --   --   --   HGB 11.4* 10.9* 10.3* 10.7* 10.4*  HCT 33.7* 33.7* 31.5* 32.3* 32.3*  MCV 99.4 104.3* 104.0* 101.3* 104.5*  PLT 402* 377 356 321 324    Basic Metabolic Panel: Recent Labs  Lab 06/12/23 2259 06/13/23 0450 06/14/23 0418 06/15/23 0414 06/16/23 0350  NA 135 135 139 135 136  K 2.8* 3.9 3.7 2.9* 4.3  CL 102 104 106 103 105  CO2 20* 22 27 20* 22  GLUCOSE 105* 114* 100* 104* 95  BUN 7 8 5* <5* <5*  CREATININE 0.67 0.78 0.74 0.85 0.89  CALCIUM 8.5* 8.2* 8.0* 8.1* 8.6*  MG  --  2.2  --   --   --     GFR: Estimated Creatinine Clearance: 44.9 mL/min (by C-G formula based on SCr of 0.89 mg/dL).  Liver Function Tests: Recent Labs  Lab 06/12/23 2259  AST 17  ALT 11  ALKPHOS 172*  BILITOT 0.6  PROT 7.5  ALBUMIN 2.9*    CBG: No results for input(s): "GLUCAP" in the last 168 hours.   Recent Results (from the past 240 hours)  Culture, blood (Routine x 2)     Status: None (Preliminary result)   Collection Time: 06/12/23 10:59 PM   Specimen: BLOOD   Result Value Ref Range Status   Specimen Description   Final    BLOOD RIGHT ANTECUBITAL Performed at [facility], [address] [place], [place] [address]    Special Requests   Final    BOTTLES DRAWN AEROBIC AND ANAEROBIC Blood Culture adequate volume Performed at [facility], [address] [place], [place] [address]    Culture   Final    NO GROWTH 4 DAYS Performed at [facility], [address], [place], [place] [address]    Report Status PENDING  Incomplete  Culture, blood (Routine x 2)     Status: Abnormal  Collection Time: 06/12/23 10:59 PM   Specimen: BLOOD  Result Value Ref Range Status   Specimen Description   Final    BLOOD LEFT ANTECUBITAL Performed at [facility], [address] [place], [place] [address]    Special Requests   Final    BOTTLES DRAWN AEROBIC AND ANAEROBIC Blood Culture adequate volume Performed at [facility], [address] [place], [place] [address]    Culture  Setup Time   Final    GRAM POSITIVE COCCI IN BOTH AEROBIC AND ANAEROBIC BOTTLES CRITICAL RESULT CALLED TO, READ BACK BY AND VERIFIED WITH: PHARMD CHRISTINE SHADE [id number] AT 1835 BY EC    Culture (Ryllie Nieland)  Final    STAPHYLOCOCCUS WARNERI THE SIGNIFICANCE OF ISOLATING THIS ORGANISM FROM Maja Mccaffery SINGLE SET OF BLOOD CULTURES WHEN MULTIPLE SETS ARE DRAWN IS UNCERTAIN. PLEASE NOTIFY THE MICROBIOLOGY DEPARTMENT WITHIN ONE WEEK IF SPECIATION AND SENSITIVITIES ARE REQUIRED. Performed at [facility], [address], [place], [place] [address]    Report Status 06/15/2023 FINAL  Final  Blood Culture ID Panel (Reflexed)     Status: Abnormal   Collection Time: 06/12/23 10:59 PM  Result Value Ref Range Status   Enterococcus faecalis NOT DETECTED NOT DETECTED Final   Enterococcus Faecium NOT DETECTED NOT DETECTED Final   Listeria monocytogenes NOT DETECTED NOT DETECTED Final   Staphylococcus species DETECTED (Brynli Ollis)  NOT DETECTED Final    Comment: CRITICAL RESULT CALLED TO, READ BACK BY AND VERIFIED WITH: PHARMD CHRISTINE SHADE [id number] AT 1835 BY EC    Staphylococcus aureus (BCID) NOT DETECTED NOT DETECTED Final   Staphylococcus epidermidis NOT DETECTED NOT DETECTED Final   Staphylococcus lugdunensis NOT DETECTED NOT DETECTED Final   Streptococcus species NOT DETECTED NOT DETECTED Final   Streptococcus agalactiae NOT DETECTED NOT DETECTED Final   Streptococcus pneumoniae NOT DETECTED NOT DETECTED Final   Streptococcus pyogenes NOT DETECTED NOT DETECTED Final   Zimir Kittleson.calcoaceticus-baumannii NOT DETECTED NOT DETECTED Final   Bacteroides fragilis NOT DETECTED NOT DETECTED Final   Enterobacterales NOT DETECTED NOT DETECTED Final   Enterobacter cloacae complex NOT DETECTED NOT DETECTED Final   Escherichia coli NOT DETECTED NOT DETECTED Final   Klebsiella aerogenes NOT DETECTED NOT DETECTED Final   Klebsiella oxytoca NOT DETECTED NOT DETECTED Final   Klebsiella pneumoniae NOT DETECTED NOT DETECTED Final   Proteus species NOT DETECTED NOT DETECTED Final   Salmonella species NOT DETECTED NOT DETECTED Final   Serratia marcescens NOT DETECTED NOT DETECTED Final   Haemophilus influenzae NOT DETECTED NOT DETECTED Final   Neisseria meningitidis NOT DETECTED NOT DETECTED Final   Pseudomonas aeruginosa NOT DETECTED NOT DETECTED Final   Stenotrophomonas maltophilia NOT DETECTED NOT DETECTED Final   Candida albicans NOT DETECTED NOT DETECTED Final   Candida auris NOT DETECTED NOT DETECTED Final   Candida glabrata NOT DETECTED NOT DETECTED Final   Candida krusei NOT DETECTED NOT DETECTED Final   Candida parapsilosis NOT DETECTED NOT DETECTED Final   Candida tropicalis NOT DETECTED NOT DETECTED Final   Cryptococcus neoformans/gattii NOT DETECTED NOT DETECTED Final    Comment: Performed at [facility], [address], [place], [place] [address]  Urine Culture (for pregnant, neutropenic or urologic patients or  patients with an indwelling urinary catheter)     Status: Abnormal   Collection Time: 06/12/23 11:02 PM   Specimen: Urine, Clean Catch  Result Value Ref Range Status   Specimen Description   Final    URINE, CLEAN CATCH  Performed at [facility], [address], [place], [place] [address]    Special Requests   Final    NONE Performed at [facility], [address] [place], [place] [address]    Culture MULTIPLE SPECIES PRESENT, SUGGEST RECOLLECTION (Baya Lentz)  Final   Report Status 06/14/2023 FINAL  Final  Resp panel by RT-PCR (RSV, Flu Keeanna Villafranca&B, Covid) Anterior Nasal Swab     Status: None   Collection Time: 06/12/23 11:27 PM   Specimen: Anterior Nasal Swab  Result Value Ref Range Status   SARS Coronavirus 2 by RT PCR NEGATIVE NEGATIVE Final    Comment: (NOTE) SARS-CoV-2 target nucleic acids are NOT DETECTED.  The SARS-CoV-2 RNA is generally detectable in upper respiratory specimens during the acute phase of infection. The lowest concentration of SARS-CoV-2 viral copies this assay can detect is 138 copies/mL. Shakala Marlatt negative result does not preclude SARS-Cov-2 infection and should not be used as the sole basis for treatment or other patient management decisions. Huy Majid negative result may occur with  improper specimen collection/handling, submission of specimen other than nasopharyngeal swab, presence of viral mutation(s) within the areas targeted by this assay, and inadequate number of viral copies(<138 copies/mL). Daymein Nunnery negative result must be combined with clinical observations, patient history, and epidemiological information. The expected result is Negative.  Fact Sheet for Patients:  BloggerCourse.com  Fact Sheet for Healthcare Providers:  SeriousBroker.it  This test is no t yet approved or cleared by the [place]  FDA and  has been authorized for detection and/or diagnosis of SARS-CoV-2 by FDA under an  Emergency Use Authorization (EUA). This EUA will remain  in effect (meaning this test can be used) for the duration of the COVID-19 declaration under Section 564(b)(1) of the Act, 21 U.S.C.section 360bbb-3(b)(1), unless the authorization is terminated  or revoked sooner.       Influenza Presley Gora by PCR NEGATIVE NEGATIVE Final   Influenza B by PCR NEGATIVE NEGATIVE Final    Comment: (NOTE) The Xpert Xpress SARS-CoV-2/FLU/RSV plus assay is intended as an aid in the diagnosis of influenza from Nasopharyngeal swab specimens and should not be used as Cloys Vera sole basis for treatment. Nasal washings and aspirates are unacceptable for Xpert Xpress SARS-CoV-2/FLU/RSV testing.  Fact Sheet for Patients: BloggerCourse.com  Fact Sheet for Healthcare Providers: SeriousBroker.it  This test is not yet approved or cleared by the [place]  FDA and has been authorized for detection and/or diagnosis of SARS-CoV-2 by FDA under an Emergency Use Authorization (EUA). This EUA will remain in effect (meaning this test can be used) for the duration of the COVID-19 declaration under Section 564(b)(1) of the Act, 21 U.S.C. section 360bbb-3(b)(1), unless the authorization is terminated or revoked.     Resp Syncytial Virus by PCR NEGATIVE NEGATIVE Final    Comment: (NOTE) Fact Sheet for Patients: BloggerCourse.com  Fact Sheet for Healthcare Providers: SeriousBroker.it  This test is not yet approved or cleared by the [place]  FDA and has been authorized for detection and/or diagnosis of SARS-CoV-2 by FDA under an Emergency Use Authorization (EUA). This EUA will remain in effect (meaning this test can be used) for the duration of the COVID-19 declaration under Section 564(b)(1) of the Act, 21 U.S.C. section 360bbb-3(b)(1), unless the authorization is terminated or revoked.  Performed at [facility], [address] [place], [place] [address]   C Difficile Quick Screen w PCR reflex     Status: None   Collection Time: 06/13/23 11:42 AM  Specimen: STOOL  Result Value Ref Range Status   C Diff antigen NEGATIVE NEGATIVE Final   C Diff toxin NEGATIVE NEGATIVE Final   C Diff interpretation No C. difficile detected.  Final    Comment: Performed at [facility], [address] [place], [place] [address]  Culture, blood (Routine X 2) w Reflex to ID Panel     Status: None (Preliminary result)   Collection Time: 06/14/23  7:59 AM   Specimen: BLOOD  Result Value Ref Range Status   Specimen Description   Final    BLOOD BLOOD RIGHT ARM AEROBIC BOTTLE ONLY ANAEROBIC BOTTLE ONLY Performed at [facility], [address] [place], [place] [address]    Special Requests   Final    BOTTLES DRAWN AEROBIC AND ANAEROBIC Blood Culture results may not be optimal due to an inadequate volume of blood received in culture bottles Performed at [facility], [address] [place], [place] [address]    Culture   Final    NO GROWTH 3 DAYS Performed at [facility], [address], [place], [place] [address]    Report Status PENDING  Incomplete  Culture, blood (Routine X 2) w Reflex to ID Panel     Status: None (Preliminary result)   Collection Time: 06/14/23  8:09 AM   Specimen: BLOOD  Result Value Ref Range Status   Specimen Description   Final    BLOOD BLOOD LEFT HAND AEROBIC BOTTLE ONLY ANAEROBIC BOTTLE ONLY Performed at [facility], [address], [place], [place] [address]    Special Requests   Final    BOTTLES DRAWN AEROBIC AND ANAEROBIC Blood Culture results may not be optimal due to an inadequate volume of blood received in culture bottles Performed at [facility], [address], [place], [place] [address]    Culture   Final    NO GROWTH 3 DAYS Performed at [facility], [address], [place], [place] [address]    Report Status PENDING  Incomplete         Radiology Studies: US  AXILLARY NODE CORE BIOPSY LEFT Result Date: 06/16/2023 INDICATION: 58 year old female with history of left axillary mass in suspicion for metastatic disease of uncertain etiology. EXAM: Ultrasound-guided left axillary mass biopsy MEDICATIONS: None. ANESTHESIA/SEDATION: Moderate (conscious) sedation was employed during this procedure. Egan Sahlin total of Versed 2 mg and Fentanyl 100 mcg was administered intravenously. Moderate Sedation Time: 13 minutes. The patient's level of consciousness and vital signs were monitored continuously by radiology nursing throughout the procedure under my direct supervision. FLUOROSCOPY TIME:  None. COMPLICATIONS: None immediate. PROCEDURE: Informed written consent was obtained from the patient after Nuria Phebus thorough discussion of the procedural risks, benefits and alternatives. All questions were addressed. Maximal Sterile Barrier Technique was utilized including caps, mask, sterile gowns, sterile gloves, sterile drape, hand hygiene and skin antiseptic. Jonanthan Bolender timeout was performed prior to the initiation of the procedure. Preprocedure ultrasound evaluation demonstrated multifocal left axillary prominent lymph nodes. The largest, more anterior lymph node was targeted for biopsy. The procedure was planned. Subdermal Local anesthesia was administered at the planned needle entry site with 1% lidocaine. Alexsa Flaum small skin nick was made. Under direct ultrasound visualization, Javia Dillow 17 gauge coaxial introducer needle was directed to the periphery of the mass. This was followed by acquisition of 2, 18 gauge core biopsy samples which were placed in formalin. The needle was removed. Postprocedure ultrasound evaluation demonstrated no evidence  of surrounding hematoma or other complicating features. Selso Mannor sterile bandage was applied. The patient tolerated the procedure well and was transferred back to the  floor in good condition. IMPRESSION: Technically successful ultrasound-guided left axillary mass/lymph node biopsy. Creasie Doctor, MD [facility] Radiology Electronically Signed   By: Creasie Doctor M.D.   On: 06/16/2023 21:28        Scheduled Meds:  enoxaparin  (LOVENOX ) injection  30 mg Subcutaneous Q24H   folic acid   1 mg Oral Daily   multivitamin with minerals  1 tablet Oral Daily   sodium chloride  flush  3 mL Intravenous Q12H   thiamine   100 mg Oral Daily   Or   thiamine   100 mg Intravenous Daily   Vitamin D (Ergocalciferol)  50,000 Units Oral Q7 days   Continuous Infusions:  ceFEPime (MAXIPIME) IV 2 g (06/17/23 1013)   vancomycin 1,000 mg (06/17/23 1629)     LOS: 4 days    Time spent: over 30 min     Donnetta Gains, MD [facility]   To contact the attending provider between 7A-7P or the covering provider during after hours 7P-7A, please log into the web site [email or web address] and access using universal [employer] password for that web site. If you do not have the password, please call the hospital operator.  06/17/2023, 5:33 PM

## 2023-06-17 NOTE — Progress Notes (Signed)
   06/17/23 1325  Spiritual Encounters  Type of Visit Follow up  Care provided to: Pt and family  Reason for visit Advance directives  OnCall Visit No    Chaplain responded to page for AD follow-up. Chaplain provided paperwork and education. Pt and pt's daughter present and will handle signing/notarization using their own resources. Pt and family aware chaplains remain available.

## 2023-06-17 NOTE — Progress Notes (Signed)
 Copy of AD placed in patient chart. Daughter of patient has original AD in her possession.

## 2023-06-18 ENCOUNTER — Other Ambulatory Visit (HOSPITAL_COMMUNITY): Payer: Self-pay

## 2023-06-18 DIAGNOSIS — Z556 Problems related to health literacy: Secondary | ICD-10-CM | POA: Insufficient documentation

## 2023-06-18 LAB — CULTURE, BLOOD (ROUTINE X 2)
Culture: NO GROWTH
Special Requests: ADEQUATE

## 2023-06-18 LAB — CBC WITH DIFFERENTIAL/PLATELET
Abs Immature Granulocytes: 0.1 10*3/uL — ABNORMAL HIGH (ref 0.00–0.07)
Basophils Absolute: 0.1 10*3/uL (ref 0.0–0.1)
Basophils Relative: 0 %
Eosinophils Absolute: 0 10*3/uL (ref 0.0–0.5)
Eosinophils Relative: 0 %
HCT: 32.2 % — ABNORMAL LOW (ref 36.0–46.0)
Hemoglobin: 10.7 g/dL — ABNORMAL LOW (ref 12.0–15.0)
Immature Granulocytes: 1 %
Lymphocytes Relative: 14 %
Lymphs Abs: 1.6 10*3/uL (ref 0.7–4.0)
MCH: 34 pg (ref 26.0–34.0)
MCHC: 33.2 g/dL (ref 30.0–36.0)
MCV: 102.2 fL — ABNORMAL HIGH (ref 80.0–100.0)
Monocytes Absolute: 1.1 10*3/uL — ABNORMAL HIGH (ref 0.1–1.0)
Monocytes Relative: 10 %
Neutro Abs: 8.5 10*3/uL — ABNORMAL HIGH (ref 1.7–7.7)
Neutrophils Relative %: 75 %
Platelets: 361 10*3/uL (ref 150–400)
RBC: 3.15 MIL/uL — ABNORMAL LOW (ref 3.87–5.11)
RDW: 12.4 % (ref 11.5–15.5)
WBC: 11.4 10*3/uL — ABNORMAL HIGH (ref 4.0–10.5)
nRBC: 0 % (ref 0.0–0.2)

## 2023-06-18 LAB — SURGICAL PATHOLOGY

## 2023-06-18 LAB — COMPREHENSIVE METABOLIC PANEL WITH GFR
ALT: 10 U/L (ref 0–44)
AST: 11 U/L — ABNORMAL LOW (ref 15–41)
Albumin: 2.8 g/dL — ABNORMAL LOW (ref 3.5–5.0)
Alkaline Phosphatase: 104 U/L (ref 38–126)
Anion gap: 11 (ref 5–15)
BUN: 14 mg/dL (ref 6–20)
CO2: 23 mmol/L (ref 22–32)
Calcium: 8.8 mg/dL — ABNORMAL LOW (ref 8.9–10.3)
Chloride: 100 mmol/L (ref 98–111)
Creatinine, Ser: 0.96 mg/dL (ref 0.44–1.00)
GFR, Estimated: >60 mL/min (ref >60)
Glucose, Bld: 116 mg/dL — ABNORMAL HIGH (ref 70–99)
Potassium: 4.2 mmol/L (ref 3.5–5.1)
Sodium: 134 mmol/L — ABNORMAL LOW (ref 135–145)
Total Bilirubin: 0.4 mg/dL (ref 0.0–1.2)
Total Protein: 7.1 g/dL (ref 6.5–8.1)

## 2023-06-18 LAB — PHOSPHORUS: Phosphorus: 3.6 mg/dL (ref 2.5–4.6)

## 2023-06-18 LAB — MAGNESIUM: Magnesium: 2.4 mg/dL (ref 1.7–2.4)

## 2023-06-18 MED ORDER — TRAZODONE HCL 50 MG PO TABS
50.0000 mg | ORAL_TABLET | Freq: Every evening | ORAL | 1 refills | Status: DC | PRN
  Filled 2023-06-18: qty 30, 30d supply, fill #0

## 2023-06-18 MED ORDER — DIPHENHYDRAMINE HCL 25 MG PO TABS
25.0000 mg | ORAL_TABLET | Freq: Four times a day (QID) | ORAL | 0 refills | Status: DC | PRN
  Filled 2023-06-18 (×2): qty 30, 8d supply, fill #0

## 2023-06-18 MED ORDER — LORATADINE 10 MG PO TABS
10.0000 mg | ORAL_TABLET | Freq: Every day | ORAL | 1 refills | Status: DC
  Filled 2023-06-18: qty 30, 30d supply, fill #0

## 2023-06-18 MED ORDER — SULFAMETHOXAZOLE-TRIMETHOPRIM 800-160 MG PO TABS
1.0000 | ORAL_TABLET | Freq: Two times a day (BID) | ORAL | Status: DC
  Administered 2023-06-18: 1 via ORAL
  Filled 2023-06-18: qty 1

## 2023-06-18 MED ORDER — FLUOXETINE HCL 10 MG PO CAPS
10.0000 mg | ORAL_CAPSULE | Freq: Every day | ORAL | 1 refills | Status: DC
  Filled 2023-06-18: qty 30, 30d supply, fill #0
  Filled 2023-07-16: qty 30, 30d supply, fill #1

## 2023-06-18 MED ORDER — OXYCODONE HCL 5 MG PO TABS
5.0000 mg | ORAL_TABLET | Freq: Three times a day (TID) | ORAL | 0 refills | Status: AC | PRN
  Filled 2023-06-18: qty 15, 5d supply, fill #0

## 2023-06-18 MED ORDER — VITAMIN D (ERGOCALCIFEROL) 1.25 MG (50000 UNIT) PO CAPS
50000.0000 [IU] | ORAL_CAPSULE | ORAL | 0 refills | Status: DC
  Filled 2023-06-18: qty 7, 49d supply, fill #0

## 2023-06-18 MED ORDER — SULFAMETHOXAZOLE-TRIMETHOPRIM 800-160 MG PO TABS
1.0000 | ORAL_TABLET | Freq: Two times a day (BID) | ORAL | 0 refills | Status: AC
  Filled 2023-06-18: qty 16, 8d supply, fill #0

## 2023-06-18 NOTE — Evaluation (Signed)
 Physical Therapy One Time Evaluation Patient Details Name: Elizabeth Mason MRN: [id number] DOB: 17-Aug-1965 Today's Date: 06/18/2023  History of Present Illness  58 year old female with depression, alcohol abuse presented with shaking chills, intermittent abdominal pain with nausea, vomiting.  Admitted with sepsis due to pyelo.  Found to have metastatic breast cancer.  Clinical Impression  Patient evaluated by Physical Therapy with no further acute PT needs identified. All education has been completed and the patient has no further questions.  Pt reports mostly just ambulating to/from bathroom this admission however mostly in bed.  Pt very eager and motivated to ambulate longer distance and ambulated in hallway 200 feet.  Pt would benefit from youth RW upon d/c to improve stability/safety/endurance.  Pt to f/u with oncologist so discussed OP vs HHPT may be indicated pending her treatment plan course however both daughter and pt okay with awaiting oncologist visit prior to f/u therapy.  Pt will have assist from significant other and son upon d/c and reports her daughter is POA and will be assisting with managing her appointments. PT is signing off. Thank you for this referral.       If plan is discharge home, recommend the following: Assistance with cooking/housework;Help with stairs or ramp for entrance   Can travel by private vehicle        Equipment Recommendations Rolling walker (2 wheels) (youth)  Recommendations for Other Services       Functional Status Assessment Patient has had a recent decline in their functional status and demonstrates the ability to make significant improvements in function in a reasonable and predictable amount of time.     Precautions / Restrictions Precautions Precautions: None      Mobility  Bed Mobility               General bed mobility comments: pt sitting EOB on arrival    Transfers Overall transfer level: Needs assistance Equipment used:  Rolling walker (2 wheels) Transfers: Sit to/from Stand Sit to Stand: Contact guard assist           General transfer comment: CGA- supervision for safety but also reports ambulating to/from bathroom without staff assist    Ambulation/Gait Ambulation/Gait assistance: Contact guard assist Gait Distance (Feet): 200 Feet Assistive device: Rolling walker (2 wheels) Gait Pattern/deviations: Step-through pattern, Decreased stride length Gait velocity: decr     General Gait Details: initially reaching for wall and UE support in room so provided RW, slow but steady with RW, reliant on UE support  Stairs            Wheelchair Mobility     Tilt Bed    Modified Rankin (Stroke Patients Only)       Balance Overall balance assessment: Needs assistance         Standing balance support: Bilateral upper extremity supported, During functional activity, Reliant on assistive device for balance Standing balance-Leahy Scale: Poor                               Pertinent Vitals/Pain Pain Assessment Pain Assessment: Faces Faces Pain Scale: Hurts little more Pain Location: Sore all over Pain Descriptors / Indicators: Sore Pain Intervention(s): Monitored during session, Repositioned    Home Living Family/patient expects to be discharged to:: Private residence Living Arrangements: Spouse/significant other Available Help at Discharge: Family Type of Home: House Home Access: Stairs to enter   Secretary/administrator of Steps: 2-3   Home Layout: One  level Home Equipment: None Additional Comments: lives with significant other (of 21 years) and son    Prior Function Prior Level of Function : Independent/Modified Independent                     Extremity/Trunk Assessment   Upper Extremity Assessment Upper Extremity Assessment: Overall WFL for tasks assessed    Lower Extremity Assessment Lower Extremity Assessment: Overall WFL for tasks assessed     Cervical / Trunk Assessment Cervical / Trunk Assessment: Normal  Communication   Communication Communication: No apparent difficulties    Cognition Arousal: Alert Behavior During Therapy: WFL for tasks assessed/performed   PT - Cognitive impairments: No apparent impairments                         Following commands: Intact       Cueing       General Comments      Exercises     Assessment/Plan    PT Assessment Patient does not need any further PT services  PT Problem List         PT Treatment Interventions      PT Goals (Current goals can be found in the Care Plan section)  Acute Rehab PT Goals PT Goal Formulation: All assessment and education complete, DC therapy    Frequency       Co-evaluation               AM-PAC PT "6 Clicks" Mobility  Outcome Measure Help needed turning from your back to your side while in a flat bed without using bedrails?: None Help needed moving from lying on your back to sitting on the side of a flat bed without using bedrails?: None Help needed moving to and from a bed to a chair (including a wheelchair)?: None Help needed standing up from a chair using your arms (e.g., wheelchair or bedside chair)?: None Help needed to walk in hospital room?: A Little Help needed climbing 3-5 steps with a railing? : A Little 6 Click Score: 22    End of Session Equipment Utilized During Treatment: Gait belt Activity Tolerance: Patient tolerated treatment well Patient left: in bed;with call bell/phone within reach;with family/visitor present Nurse Communication: Mobility status PT Visit Diagnosis: Difficulty in walking, not elsewhere classified (R26.2)    Time: 1610-9604 PT Time Calculation (min) (ACUTE ONLY): 14 min   Charges:   PT Evaluation $PT Eval Low Complexity: 1 Low   PT General Charges $$ ACUTE PT VISIT: 1 Visit       Henretta Lodge PT, DPT Physical Therapist Acute Rehabilitation Services Office:  [phone]   Myna Asal Payson 06/18/2023, 4:57 PM

## 2023-06-18 NOTE — Discharge Summary (Signed)
 Physician Discharge Summary  Elizabeth Mason ZOX:[id number] DOB: [birth year] DOA: 06/12/2023  PCP: Patient, No Pcp Per  Admit date: 06/12/2023 Discharge date: 06/18/2023  Time spent: 40 minutes  Recommendations for Outpatient Follow-up:  Follow outpatient CBC/CMP  Follow with oncology 6/5 outpatient at 9 as scheduled Follow hydro with urology outpatient, ensure resolution with repeat imaging  Follow vit D def outpatient Poor health literacy, will need help from daughter for most medical decisions (especially the more complex)  Discharge Diagnoses:  Principal Problem:   SIRS (systemic inflammatory response syndrome) (HCC) Active Problems:   Alcohol abuse   Mass of axilla, bilateral   MDD (major depressive disorder)   Hypokalemia   Hydronephrosis of left kidney   Metastasis to bone (HCC)   Open wound of left axillary region with complication   Sepsis (HCC)   Mass of upper outer quadrant of left breast   Discharge Condition: stable  Diet recommendation: heart healthy  Filed Weights   06/13/23 0045  Weight: 41.3 kg    History of present illness:   58 year old female with depression, alcohol abuse presented with shaking chills, intermittent abdominal pain with nausea, vomiting. Patient reported shaking chills for the past 5 days prior to admission, loss of appetite, pain in the mid abdomen, intermittent, occasional dysuria.    Admitted with sepsis due to pyelo.  Found to have metastatic breast cancer.  Stable for discharge 5/29 with antibiotics to cover pyelo.  Plan for oncology follow up outpatient.  See below for additional details.   Hospital Course:  Assessment and Plan:  Poor Medical Literacy Didn't follow up after hospitalization from March 2023 where she was recommended Neaveh Belanger mammogram and outpatient oncology follow up.  Daughter has now completed HCPOA paperwork here.  I think Mrs. Mencer would lack capacity for more complex medical decisions and will need the assistance  of her daughter/family for medical decisions and to help her follow up.  Daughter on board and planning to help with her follow up.     Sepsis  Pyelonephritis  Left Sided Hydronephrosis Ruled in with fever, tachypnea, tachycardia, leukocytosis - source urinary Temp this am 100.2, not Nayab Aten true fever, but also c/o rigors overnight Persistent, but improving leukocytosis CT with findings concerning for pyelonephritis, also L sided hydro and hydroureter due to pyelo renal ultrasound with persistent L hydro (mild) - discussed informally with urology, they recommended abx and follow outpatient with repeat CT/ultrasound (will have her follow outpatient with [facility])  Discharge on bactrim  to cover UTI (no culture specific data with urine culture with multiple species)   Metastatic Invasive Ductal Carcinoma of Breast Appreciate oncology assistance CA 15-3 158, CA 27.29 193.3 Hold abx for L axilla, suspect changes related to malignancy rather than infection CT with diffuse sclerotic lesions throughout the axial and appendicular skeleton - c/w skeletal metastases Appointment with Dr. Salomon Cree 6/5 at 9 am Oxycodone  prn for cancer related pain.  Claritin, benadryl prn itching.   Hypokalemia Improved   Etoh Use No signs of withdrawal    MDD Prozac , trazodone   Acute Hypoxic Respiratory Failure Now resolved   Vit D Deficiency Replace    Staph Warneri Positive Blood Culture Suspected contaminant, in 1 of 2 sets Repeat culture ngx4  Underweight Body mass index is 16.65 kg/m.    Procedures: 5/27 Technically successful ultrasound-guided left axillary mass/lymph node biopsy.  Consultations: Urology informally over phone oncology  Discharge Exam: Vitals:   06/17/23 2335 06/18/23 0334  BP: 120/80 127/73  Pulse:  85  Resp: 16 17  Temp: 98.6 F (37 C) 98.5 F (36.9 C)  SpO2: 95% 98%   Eager to discharge Daughter and grandson at bedside No new complaints, some mild  abdominal discomfort, intermittent L axilla discomfort  General: No acute distress. Cardiovascular: RRR Lungs: unlabored Abdomen: Soft, nontender, nondistended No CVA ttp  Neurological: Alert and oriented 3. Moves all extremities 4 . Cranial nerves II through XII grossly intact. Skin: L axilla with ulcerated mass - stable, no si/sx infection Extremities: No clubbing or cyanosis. No edema.   Discharge Instructions   Discharge Instructions     Call MD for:  difficulty breathing, headache or visual disturbances   Complete by: As directed    Call MD for:  extreme fatigue   Complete by: As directed    Call MD for:  hives   Complete by: As directed    Call MD for:  persistant dizziness or light-headedness   Complete by: As directed    Call MD for:  persistant nausea and vomiting   Complete by: As directed    Call MD for:  redness, tenderness, or signs of infection (pain, swelling, redness, odor or green/yellow discharge around incision site)   Complete by: As directed    Call MD for:  severe uncontrolled pain   Complete by: As directed    Call MD for:  temperature >100.4   Complete by: As directed    Diet - low sodium heart healthy   Complete by: As directed    Discharge instructions   Complete by: As directed    You were seen for sepsis due to Santino Kinsella urine infection.  You're improving overall.  We'll send you with bactrim  for Therma Lasure UTI (urinary tract infection).  You had hydronephrosis (enlarged kidney) which maybe related to the infection itself.  You should follow up with urology after this hospitalization to ensure it's improving with treatment.  Call [facility] for an appointment within 1-2 weeks after this hospital stay.  You were diagnosed with metastatic cancer of the breast.  You have Shantinique Picazo follow up appointment at 9 am on 6/5 with Dr. Salomon Cree to plan your palliative treatment.  I'll send you with oxycodone  to use as needed for pain.  Use claritin daily to help with itching.  You  can also use benadryl as needed for itching.    We're sending you with prozac  and trazodone  for your depression/mood disorder.  Trazodone  will help with your sleep.  We'll send you with vitamin D supplementation.  Return for new, recurrent, or worsening symptoms.  Please ask your PCP to request records from this hospitalization so they know what was done and what the next steps will be.   Increase activity slowly   Complete by: As directed    No wound care   Complete by: As directed       Allergies as of 06/18/2023   No Known Allergies      Medication List     STOP taking these medications    nicotine  14 mg/24hr patch Commonly known as: NICODERM CQ  - dosed in mg/24 hours       TAKE these medications    diphenhydrAMINE 25 mg capsule Commonly known as: Benadryl Allergy Take 1 capsule (25 mg total) by mouth every 6 (six) hours as needed for itching. Caution with drowsiness.   FLUoxetine  10 MG capsule Commonly known as: PROZAC  Take 1 capsule (10 mg total) by mouth daily.   loratadine 10 MG tablet Commonly known as:  Claritin  Take 1 tablet (10 mg total) by mouth daily.   oxyCODONE  5 MG immediate release tablet Commonly known as: Oxy IR/ROXICODONE  Take 1 tablet (5 mg total) by mouth every 8 (eight) hours as needed for up to 5 days (pain not relived with tylenol ).   sulfamethoxazole -trimethoprim  800-160 MG tablet Commonly known as: BACTRIM  DS Take 1 tablet by mouth every 12 (twelve) hours for 8 days.   traZODone  50 MG tablet Commonly known as: DESYREL  Take 1 tablet (50 mg total) by mouth at bedtime as needed for sleep.   Vitamin D  (Ergocalciferol ) 1.25 MG (50000 UNIT) Caps capsule Commonly known as: DRISDOL  Take 1 capsule (50,000 Units total) by mouth every 7 (seven) days for 7 doses. Start taking on: June 23, 2023       No Known Allergies  Follow-up Information     [facility] Follow up.   Why: Call for follow up appointment for your  hydronephrosis (enlarged kidney) and kidney infection Contact information: [address] [place] [address] [place]  [address] [phone]        Frankie Israel, MD Follow up.   Specialties: Hematology, Oncology Why: follow up as scheduled with Dr. Salomon Cree outpatient Contact information: [address] [place] [address] [phone]                  The results of significant diagnostics from this hospitalization (including imaging, microbiology, ancillary and laboratory) are listed below for reference.    Significant Diagnostic Studies: US  RENAL Result Date: 06/17/2023 CLINICAL DATA:  Hydronephrosis. EXAM: RENAL / URINARY TRACT ULTRASOUND COMPLETE COMPARISON:  CT dated 06/13/2023. FINDINGS: Right Kidney: Renal measurements: 9.6 x 3.7 x 4.8 cm = volume: 88 mL. Mild increased echogenicity. No hydronephrosis or shadowing stone. Left Kidney: Renal measurements: 11.0 x 6.6 x 6.3 cm = volume: 239 mL. Mild increased echogenicity. There is mild left hydronephrosis. No shadowing stone. Bladder: Appears normal for degree of bladder distention. Other: None. IMPRESSION: Persistent left hydronephrosis similar to prior CT. Electronically Signed   By: Angus Bark M.D.   On: 06/17/2023 19:26   US  AXILLARY NODE CORE BIOPSY LEFT Result Date: 06/16/2023 INDICATION: 58 year old female with history of left axillary mass in suspicion for metastatic disease of uncertain etiology. EXAM: Ultrasound-guided left axillary mass biopsy MEDICATIONS: None. ANESTHESIA/SEDATION: Moderate (conscious) sedation was employed during this procedure. Salvador Bigbee total of Versed  2 mg and Fentanyl  100 mcg was administered intravenously. Moderate Sedation Time: 13 minutes. The patient's level of consciousness and vital signs were monitored continuously by radiology nursing throughout the procedure under my direct supervision. FLUOROSCOPY TIME:  None. COMPLICATIONS: None immediate. PROCEDURE: Informed written  consent was obtained from the patient after Doni Bacha thorough discussion of the procedural risks, benefits and alternatives. All questions were addressed. Maximal Sterile Barrier Technique was utilized including caps, mask, sterile gowns, sterile gloves, sterile drape, hand hygiene and skin antiseptic. Ryoma Nofziger timeout was performed prior to the initiation of the procedure. Preprocedure ultrasound evaluation demonstrated multifocal left axillary prominent lymph nodes. The largest, more anterior lymph node was targeted for biopsy. The procedure was planned. Subdermal Local anesthesia was administered at the planned needle entry site with 1% lidocaine . Cayton Cuevas small skin nick was made. Under direct ultrasound visualization, Susette Seminara 17 gauge coaxial introducer needle was directed to the periphery of the mass. This was followed by acquisition of 2, 18 gauge core biopsy samples which were placed in formalin. The needle was removed. Postprocedure ultrasound evaluation demonstrated no evidence of surrounding hematoma  or other complicating features. Bettye Sitton sterile bandage was applied. The patient tolerated the procedure well and was transferred back to the floor in good condition. IMPRESSION: Technically successful ultrasound-guided left axillary mass/lymph node biopsy. Creasie Doctor, MD [facility] Electronically Signed   By: Creasie Doctor M.D.   On: 06/16/2023 21:28   MR BRAIN W WO CONTRAST Result Date: 06/15/2023 CLINICAL DATA:  Invasive breast carcinoma staging EXAM: MRI HEAD WITHOUT AND WITH CONTRAST TECHNIQUE: Multiplanar, multiecho pulse sequences of the brain and surrounding structures were obtained without and with intravenous contrast. CONTRAST:  5mL GADAVIST GADOBUTROL 1 MMOL/ML IV SOLN COMPARISON:  None Available. FINDINGS: Brain: No acute infarct, mass effect or extra-axial collection. Remote microhemorrhage in the right temporal lobe. Normal white matter signal, parenchymal volume  and CSF spaces. The midline structures are normal. There is no abnormal contrast enhancement. Vascular: Normal flow voids. Skull and upper cervical spine: Normal calvarium and skull base. Visualized upper cervical spine and soft tissues are normal. Sinuses/Orbits:Right mastoid fluid. Paranasal sinuses are clear. Normal orbits. IMPRESSION: No intracranial metastatic disease. Electronically Signed   By: Juanetta Nordmann M.D.   On: 06/15/2023 19:22   US  LT UPPER EXTREM LTD SOFT TISSUE NON VASCULAR Result Date: 06/13/2023 CLINICAL DATA:  Left axillary lymphadenopathy. EXAM: ULTRASOUND LEFT UPPER EXTREMITY LIMITED TECHNIQUE: Ultrasound examination of the upper extremity soft tissues was performed in the area of clinical concern. COMPARISON:  06/13/2023 FINDINGS: Neftali Abair soft tissue mass is noted in the left axilla measuring 2.7 x 1.1 x 2.9 cm with internal vascularity. IMPRESSION: Yancey Pedley soft tissue mass in the left axilla measuring up to 2.9 cm with internal vascularity. Findings may represent metastatic disease versus lymphadenopathy. Correlation with diagnostic mammography is recommended given history of breast cancer. Electronically Signed   By: Wyvonnia Heimlich M.D.   On: 06/13/2023 17:23   CT CHEST ABDOMEN PELVIS W CONTRAST Result Date: 06/13/2023 CLINICAL DATA:  Metastatic evaluation, nausea and vomiting, abdominal pain and lethargy, sclerotic lesions on recent CT concerning for bony metastases EXAM: CT CHEST, ABDOMEN, AND PELVIS WITH CONTRAST TECHNIQUE: Multidetector CT imaging of the chest, abdomen and pelvis was performed following the standard protocol during bolus administration of intravenous contrast. RADIATION DOSE REDUCTION: This exam was performed according to the departmental dose-optimization program which includes automated exposure control, adjustment of the mA and/or kV according to patient size and/or use of iterative reconstruction technique. CONTRAST:  80mL OMNIPAQUE  IOHEXOL  300 MG/ML  SOLN COMPARISON:   06/12/2023, 09/18/2021 FINDINGS: CT CHEST FINDINGS Cardiovascular: The heart is unremarkable without pericardial effusion. No evidence of thoracic aortic aneurysm or dissection. Mediastinum/Nodes: Thyroid, trachea, and esophagus are unremarkable. There is Jaelie Aguilera small hiatal hernia. No pathologic mediastinal, hilar, or axillary adenopathy. Lungs/Pleura: No acute airspace disease, effusion, or pneumothorax. Central airways are patent. Musculoskeletal: Numerous sclerotic foci are again noted within the axial and appendicular skeleton, most pronounced within the sternum, thoracic spine, thoracic cage, and left humeral head. 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 There are no acute displaced fractures. There is asymmetric enhancing soft tissue within the upper and outer aspects of the left breast, contiguous with the skin thickening and retraction in the left axilla seen on prior study. Correlation with mammographic evaluation is recommended to assess for underlying breast cancer. Reconstructed images demonstrate no additional findings. CT ABDOMEN PELVIS FINDINGS Hepatobiliary: There is Brettney Ficken 2.2 cm hemangioma seen within the superior left lobe liver reference image 44/2. No other focal liver abnormalities. The gallbladder is unremarkable. No biliary duct  dilation. Pancreas: Unremarkable. No pancreatic ductal dilatation or surrounding inflammatory changes. Spleen: Normal in size without focal abnormality. Adrenals/Urinary Tract: Enlarged edematous left kidney is again identified, with stable hydronephrosis. Heterogeneous enhancement of the left renal parenchyma is noted, especially on delayed images. There is delayed excretion of contrast. No radiopaque calculus or obstructing soft tissue mass is identified on this exam. The right kidney enhances normally. The bladder is moderately distended, with normal right ureteral jet identified on delayed imaging. The left ureteral jet is not visualized. Right adrenal is  unremarkable. There is nonspecific thickening of the left adrenal gland, measuring 2 cm in thickness with attenuation of 75 HU. Stomach/Bowel: No bowel obstruction or ileus. Normal appendix right lower quadrant. No bowel wall thickening or inflammatory change. Small hiatal hernia. Vascular/Lymphatic: Aortic atherosclerosis. No enlarged abdominal or pelvic lymph nodes. Reproductive: Prior hysterectomy.  No adnexal masses. Other: Trace pelvic free fluid. No free intraperitoneal gas. No abdominal wall hernia. Musculoskeletal: Multiple sclerotic lesions are again noted throughout the axial and appendicular skeleton, most prominent within the lumbosacral spine and iliac bones, consistent with sclerotic metastases. Reconstructed images demonstrate no additional findings. IMPRESSION: 1. Enlarged edematous left kidney, with heterogeneous enhancement and delayed excretion of contrast, consistent with pyelonephritis. No evidence of renal abscess. 2. Persistent left-sided hydronephrosis and hydroureter likely due to pyelonephritis. No radiopaque calculus or obstructing mass identified. If further evaluation is desired, retrograde evaluation could be considered when clinical situation permits. 3. Diffuse sclerotic lesions throughout the axial and appendicular skeleton, unchanged, consistent with sclerotic metastases. 4. Abnormal soft tissue density within the upper-outer left breast, contiguous with the skin thickening and retraction in the left axilla reported previously. Given sclerotic metastases, underlying breast cancer is Jaice Lague concern. Correlation with mammographic evaluation recommended. 5. Trace pelvic free fluid, nonspecific. 6.  Aortic Atherosclerosis (ICD10-I70.0). Electronically Signed   By: Bobbye Burrow M.D.   On: 06/13/2023 16:25   CT CHEST ABDOMEN PELVIS WO CONTRAST Result Date: 06/13/2023 CLINICAL DATA:  Sepsis EXAM: CT CHEST, ABDOMEN AND PELVIS WITHOUT CONTRAST TECHNIQUE: Multidetector CT imaging of the  chest, abdomen and pelvis was performed following the standard protocol without IV contrast. RADIATION DOSE REDUCTION: This exam was performed according to the departmental dose-optimization program which includes automated exposure control, adjustment of the mA and/or kV according to patient size and/or use of iterative reconstruction technique. COMPARISON:  Same day chest radiograph; CT chest 09/18/2021 FINDINGS: CT CHEST FINDINGS Cardiovascular: No pericardial effusion. Normal caliber thoracic aorta. Mediastinum/Nodes: Trachea and esophagus are unremarkable. No thoracic adenopathy. Lungs/Pleura: No focal consolidation, pleural effusion, or pneumothorax. Musculoskeletal: Multiple sclerotic lesions throughout the thoracic vertebral bodies, sternum, and bilateral ribs. For example 12 mm lesion in the mid sternum (series 10/image 74) and 17 x 15 mm lesion in the T8 vertebral body. No pathologic fracture. Partially visualized soft tissue mass in the left axilla with skin retraction measuring approximately 4.0 x 2.3 cm in the axial plane (series 2/image 12). Additional focal soft tissue thickening in the right axilla on series 2/image 16 measuring 7 x 13 mm. These findings are similar to 09/18/2021. CT ABDOMEN PELVIS FINDINGS Hepatobiliary: Unremarkable noncontrast appearance of the liver, gallbladder, and biliary tree. Pancreas: Unremarkable. Spleen: Unremarkable. Adrenals/Urinary Tract: The adrenal glands are poorly visualized without IV contrast. Unremarkable right kidney. Mild right hydronephrosis. No obstructing stone. Unremarkable bladder. Stomach/Bowel: Normal caliber large and small bowel. No bowel wall thickening. Stomach and appendix are within normal limits. Vascular/Lymphatic: Aortic atherosclerosis. No enlarged abdominal or pelvic lymph nodes. Reproductive: Hysterectomy.  No adnexal mass. Other: No free intraperitoneal fluid or air. Musculoskeletal: No acute fracture. Numerous sclerotic lesions are present  throughout the axial and visualized appendicular skeleton. For example Deannah Rossi 13 mm sclerotic lesion in the L3 vertebral body IMPRESSION: 1. Numerous sclerotic lesions throughout the axial and visualized appendicular skeleton new compared 09/18/2021, suggestive of metastatic disease of unknown primary. 2. Edematous left kidney with left hydronephrosis new since 2023. No radiopaque stone. Given findings concerning for osseous metastases, CT abdomen pelvis with IV contrast and including Letasha Kershaw renal excretory phase is recommended for further evaluation. 3. Partially visualized soft tissue mass in the left axilla with skin retraction measuring approximately 4.0 x 2.3 cm in the axial plane. Additional focal soft tissue thickening in the right axilla measuring 7 x 13 mm. These findings are similar to 09/18/2021 differential considerations include infectious/inflammatory process such as hydradenitis suppurativa or malignancy. Clinical and if appropriate histologic correlation is recommended. 4. Aortic Atherosclerosis (ICD10-I70.0). Electronically Signed   By: Rozell Cornet M.D.   On: 06/13/2023 00:21   DG Chest Port 1 View Result Date: 06/12/2023 CLINICAL DATA:  Questionable sepsis-evaluate for abnormality EXAM: PORTABLE CHEST 1 VIEW COMPARISON:  Radiographs 04/10/2021 FINDINGS: Stable cardiomediastinal silhouette. No focal consolidation, pleural effusion, or pneumothorax. No displaced rib fractures. IMPRESSION: No active disease. Electronically Signed   By: Rozell Cornet M.D.   On: 06/12/2023 23:14    Microbiology: Recent Results (from the past 240 hours)  Culture, blood (Routine x 2)     Status: None   Collection Time: 06/12/23 10:59 PM   Specimen: BLOOD  Result Value Ref Range Status   Specimen Description   Final    BLOOD RIGHT ANTECUBITAL Performed at [facility], [address] [place], [place] [address]    Special Requests   Final    BOTTLES DRAWN AEROBIC AND ANAEROBIC Blood Culture  adequate volume Performed at [facility], [address], [place], [place] [address]    Culture   Final    NO GROWTH 5 DAYS Performed at [facility], [address], [place], [place] [address]    Report Status 06/18/2023 FINAL  Final  Culture, blood (Routine x 2)     Status: Abnormal   Collection Time: 06/12/23 10:59 PM   Specimen: BLOOD  Result Value Ref Range Status   Specimen Description   Final    BLOOD LEFT ANTECUBITAL Performed at [facility], [address], [place], [place] [address]    Special Requests   Final    BOTTLES DRAWN AEROBIC AND ANAEROBIC Blood Culture adequate volume Performed at [facility], [address], [place], [place] [address]    Culture  Setup Time   Final    GRAM POSITIVE COCCI IN BOTH AEROBIC AND ANAEROBIC BOTTLES CRITICAL RESULT CALLED TO, READ BACK BY AND VERIFIED WITH: PHARMD CHRISTINE SHADE [id number] AT 1835 BY EC    Culture (Alahia Whicker)  Final    STAPHYLOCOCCUS WARNERI THE SIGNIFICANCE OF ISOLATING THIS ORGANISM FROM Analicia Skibinski SINGLE SET OF BLOOD CULTURES WHEN MULTIPLE SETS ARE DRAWN IS UNCERTAIN. PLEASE NOTIFY THE MICROBIOLOGY DEPARTMENT WITHIN ONE WEEK IF SPECIATION AND SENSITIVITIES ARE REQUIRED. Performed at [facility], [address], [place], [place] [address]    Report Status 06/15/2023 FINAL  Final  Blood Culture ID Panel (Reflexed)     Status: Abnormal   Collection Time: 06/12/23 10:59 PM  Result Value Ref Range Status   Enterococcus faecalis NOT DETECTED NOT  DETECTED Final   Enterococcus Faecium NOT DETECTED NOT DETECTED Final   Listeria monocytogenes NOT DETECTED NOT DETECTED Final   Staphylococcus species DETECTED (Khalila Buechner) NOT DETECTED Final    Comment: CRITICAL RESULT CALLED TO, READ BACK BY AND VERIFIED WITH: PHARMD CHRISTINE SHADE [id number] AT 1835 BY EC    Staphylococcus aureus (BCID) NOT DETECTED NOT DETECTED Final   Staphylococcus epidermidis NOT DETECTED NOT DETECTED  Final   Staphylococcus lugdunensis NOT DETECTED NOT DETECTED Final   Streptococcus species NOT DETECTED NOT DETECTED Final   Streptococcus agalactiae NOT DETECTED NOT DETECTED Final   Streptococcus pneumoniae NOT DETECTED NOT DETECTED Final   Streptococcus pyogenes NOT DETECTED NOT DETECTED Final   Ernestine Rohman.calcoaceticus-baumannii NOT DETECTED NOT DETECTED Final   Bacteroides fragilis NOT DETECTED NOT DETECTED Final   Enterobacterales NOT DETECTED NOT DETECTED Final   Enterobacter cloacae complex NOT DETECTED NOT DETECTED Final   Escherichia coli NOT DETECTED NOT DETECTED Final   Klebsiella aerogenes NOT DETECTED NOT DETECTED Final   Klebsiella oxytoca NOT DETECTED NOT DETECTED Final   Klebsiella pneumoniae NOT DETECTED NOT DETECTED Final   Proteus species NOT DETECTED NOT DETECTED Final   Salmonella species NOT DETECTED NOT DETECTED Final   Serratia marcescens NOT DETECTED NOT DETECTED Final   Haemophilus influenzae NOT DETECTED NOT DETECTED Final   Neisseria meningitidis NOT DETECTED NOT DETECTED Final   Pseudomonas aeruginosa NOT DETECTED NOT DETECTED Final   Stenotrophomonas maltophilia NOT DETECTED NOT DETECTED Final   Candida albicans NOT DETECTED NOT DETECTED Final   Candida auris NOT DETECTED NOT DETECTED Final   Candida glabrata NOT DETECTED NOT DETECTED Final   Candida krusei NOT DETECTED NOT DETECTED Final   Candida parapsilosis NOT DETECTED NOT DETECTED Final   Candida tropicalis NOT DETECTED NOT DETECTED Final   Cryptococcus neoformans/gattii NOT DETECTED NOT DETECTED Final    Comment: Performed at [facility], [address], [place], [place] [address]  Urine Culture (for pregnant, neutropenic or urologic patients or patients with an indwelling urinary catheter)     Status: Abnormal   Collection Time: 06/12/23 11:02 PM   Specimen: Urine, Clean Catch  Result Value Ref Range Status   Specimen Description   Final    URINE, CLEAN CATCH Performed at [facility], [address], [place], [place] [address]    Special Requests   Final    NONE Performed at [facility], [address] [place], [place] [address]    Culture MULTIPLE SPECIES PRESENT, SUGGEST RECOLLECTION (Ziyanna Tolin)  Final   Report Status 06/14/2023 FINAL  Final  Resp panel by RT-PCR (RSV, Flu Lekia Nier&B, Covid) Anterior Nasal Swab     Status: None   Collection Time: 06/12/23 11:27 PM   Specimen: Anterior Nasal Swab  Result Value Ref Range Status   SARS Coronavirus 2 by RT PCR NEGATIVE NEGATIVE Final    Comment: (NOTE) SARS-CoV-2 target nucleic acids are NOT DETECTED.  The SARS-CoV-2 RNA is generally detectable in upper respiratory specimens during the acute phase of infection. The lowest concentration of SARS-CoV-2 viral copies this assay can detect is 138 copies/mL. Javed Cotto negative result does not preclude SARS-Cov-2 infection and should not be used as the sole basis for treatment or other patient management decisions. Sherby Moncayo negative result may occur with  improper specimen collection/handling, submission of specimen other than nasopharyngeal swab, presence of viral mutation(s) within the areas targeted by this assay, and inadequate number of viral copies(<138 copies/mL). Kyrie Bun negative result must be combined  with clinical observations, patient history, and epidemiological information. The expected result is Negative.  Fact Sheet for Patients:  BloggerCourse.com  Fact Sheet for Healthcare Providers:  SeriousBroker.it  This test is no t yet approved or cleared by the [place]  FDA and  has been authorized for detection and/or diagnosis of SARS-CoV-2 by FDA under an Emergency Use Authorization (EUA). This EUA will remain  in effect (meaning this test can be used) for the duration of the COVID-19 declaration under Section 564(b)(1) of the Act, 21 U.S.C.section 360bbb-3(b)(1), unless the authorization is terminated  or  revoked sooner.       Influenza Carlesha Seiple by PCR NEGATIVE NEGATIVE Final   Influenza B by PCR NEGATIVE NEGATIVE Final    Comment: (NOTE) The Xpert Xpress SARS-CoV-2/FLU/RSV plus assay is intended as an aid in the diagnosis of influenza from Nasopharyngeal swab specimens and should not be used as Rex Oesterle sole basis for treatment. Nasal washings and aspirates are unacceptable for Xpert Xpress SARS-CoV-2/FLU/RSV testing.  Fact Sheet for Patients: BloggerCourse.com  Fact Sheet for Healthcare Providers: SeriousBroker.it  This test is not yet approved or cleared by the [place]  FDA and has been authorized for detection and/or diagnosis of SARS-CoV-2 by FDA under an Emergency Use Authorization (EUA). This EUA will remain in effect (meaning this test can be used) for the duration of the COVID-19 declaration under Section 564(b)(1) of the Act, 21 U.S.C. section 360bbb-3(b)(1), unless the authorization is terminated or revoked.     Resp Syncytial Virus by PCR NEGATIVE NEGATIVE Final    Comment: (NOTE) Fact Sheet for Patients: BloggerCourse.com  Fact Sheet for Healthcare Providers: SeriousBroker.it  This test is not yet approved or cleared by the [place]  FDA and has been authorized for detection and/or diagnosis of SARS-CoV-2 by FDA under an Emergency Use Authorization (EUA). This EUA will remain in effect (meaning this test can be used) for the duration of the COVID-19 declaration under Section 564(b)(1) of the Act, 21 U.S.C. section 360bbb-3(b)(1), unless the authorization is terminated or revoked.  Performed at [facility], [address] [place], [place] [address]   C Difficile Quick Screen w PCR reflex     Status: None   Collection Time: 06/13/23 11:42 AM   Specimen: STOOL  Result Value Ref Range Status   C Diff antigen NEGATIVE NEGATIVE Final   C Diff  toxin NEGATIVE NEGATIVE Final   C Diff interpretation No C. difficile detected.  Final    Comment: Performed at [facility], [address], [place], [place] [address]  Culture, blood (Routine X 2) w Reflex to ID Panel     Status: None (Preliminary result)   Collection Time: 06/14/23  7:59 AM   Specimen: BLOOD  Result Value Ref Range Status   Specimen Description   Final    BLOOD BLOOD RIGHT ARM AEROBIC BOTTLE ONLY ANAEROBIC BOTTLE ONLY Performed at [facility], [address] [place], [place] [address]    Special Requests   Final    BOTTLES DRAWN AEROBIC AND ANAEROBIC Blood Culture results may not be optimal due to an inadequate volume of blood received in culture bottles Performed at [facility], [address], [place], [place] [address]    Culture   Final    NO GROWTH 4 DAYS Performed at [facility], [address], [place], [place] [address]    Report Status PENDING  Incomplete  Culture, blood (Routine X 2) w Reflex to  ID Panel     Status: None (Preliminary result)   Collection Time: 06/14/23  8:09 AM   Specimen: BLOOD  Result Value Ref Range Status   Specimen Description   Final    BLOOD BLOOD LEFT HAND AEROBIC BOTTLE ONLY ANAEROBIC BOTTLE ONLY Performed at [facility], [address] [place], [place] [address]    Special Requests   Final    BOTTLES DRAWN AEROBIC AND ANAEROBIC Blood Culture results may not be optimal due to an inadequate volume of blood received in culture bottles Performed at [facility], [address] [place], [place] [address]    Culture   Final    NO GROWTH 4 DAYS Performed at [facility], [address] [place], [place] [address]    Report Status PENDING  Incomplete     Labs: Basic Metabolic Panel: Recent Labs  Lab 06/13/23 0450 06/14/23 0418 06/15/23 0414 06/16/23 0350 06/18/23 0422  NA 135 139 135 136 134*  K 3.9 3.7 2.9*  4.3 4.2  CL 104 106 103 105 100  CO2 22 27 20* 22 23  GLUCOSE 114* 100* 104* 95 116*  BUN 8 5* <5* <5* 14  CREATININE 0.78 0.74 0.85 0.89 0.96  CALCIUM 8.2* 8.0* 8.1* 8.6* 8.8*  MG 2.2  --   --   --  2.4  PHOS  --   --   --   --  3.6   Liver Function Tests: Recent Labs  Lab 06/12/23 2259 06/18/23 0422  AST 17 11*  ALT 11 10  ALKPHOS 172* 104  BILITOT 0.6 0.4  PROT 7.5 7.1  ALBUMIN 2.9* 2.8*   No results for input(s): "LIPASE", "AMYLASE" in the last 168 hours. No results for input(s): "AMMONIA" in the last 168 hours. CBC: Recent Labs  Lab 06/12/23 2259 06/13/23 0450 06/14/23 0418 06/15/23 0414 06/16/23 0350 06/18/23 0422  WBC 5.0 18.8* 11.1* 12.5* 14.1* 11.4*  NEUTROABS 4.5  --   --   --   --  8.5*  HGB 11.4* 10.9* 10.3* 10.7* 10.4* 10.7*  HCT 33.7* 33.7* 31.5* 32.3* 32.3* 32.2*  MCV 99.4 104.3* 104.0* 101.3* 104.5* 102.2*  PLT 402* 377 356 321 324 361   Cardiac Enzymes: No results for input(s): "CKTOTAL", "CKMB", "CKMBINDEX", "TROPONINI" in the last 168 hours. BNP: BNP (last 3 results) No results for input(s): "BNP" in the last 8760 hours.  ProBNP (last 3 results) No results for input(s): "PROBNP" in the last 8760 hours.  CBG: No results for input(s): "GLUCAP" in the last 168 hours.     Signed:  Donnetta Gains MD.  [facility] 06/18/2023, 12:05 PM

## 2023-06-18 NOTE — Progress Notes (Signed)
 Discharge medications delivered to patient at bedside [facility]

## 2023-06-18 NOTE — TOC Transition Note (Signed)
 Transition of Care [facility]) - Discharge Note   Patient Details  Name: Elizabeth Mason MRN: [id number] Date of Birth: 1965/05/31  Transition of [facility]) CM/SW Contact:  Gertha Ku, LCSW Phone Number: 06/18/2023, 4:56 PM   Clinical Narrative:     CSW met with the pt and the pt's daughter to discuss the recommendation for a youth walker. The pt stated she is unable to private pay for the walker. CSW received permission from [facility] leadership for the pt to receive a LOG for the youth walker. A referral was sent to [facility] for the walker to be delivered to the pt's room prior to discharge. The pt's daughter will provide transportation home. TOC sign-off.  Final next level of care: Home/Self Care Barriers to Discharge: Barriers Resolved   Patient Goals and CMS Choice            Discharge Placement                    Patient and family notified of of transfer: 06/18/23  Discharge Plan and Services Additional resources added to the After Visit Summary for                                       Social Drivers of Health (SDOH) Interventions SDOH Screenings   Food Insecurity: No Food Insecurity (06/13/2023)  Housing: Low Risk  (06/13/2023)  Transportation Needs: No Transportation Needs (06/13/2023)  Utilities: Not At Risk (06/13/2023)  Tobacco Use: High Risk (06/13/2023)     Readmission Risk Interventions     No data to display

## 2023-06-19 LAB — CULTURE, BLOOD (ROUTINE X 2)
Culture: NO GROWTH
Culture: NO GROWTH

## 2023-06-24 NOTE — Progress Notes (Signed)
 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  HISTORY OF PRESENTING ILLNESS:   Elizabeth Mason is a wonderful 58 y.o. female who has been referred to us  by Dr Thelma Fire for evaluation and management of possible metastatic breast cancer with bone mets.   Patient has a history of smoking 1 to 2 packs/day with more than 75-pack-year history of smoking, heavy alcohol use drinks 6-8 beers daily, depression and poor overall medical follow-up. Patient presented on 06/09/2023 with shaking chills, fever abdominal pain nausea and vomiting. She was diagnosed with sepsis likely due to left renal pyelonephritis and has been started on antibiotics for that. Patient had a CT chest abdomen pelvis on 06/09/2023 to evaluate her abdominal pain and this enlarged edematous left kidney consistent with pyelonephritis without renal abscess.  She was also noted to have left-sided hydronephrosis and hydroureter without any overt calculi or obstructing masses identified.  Patient was also incidentally noted to have soft tissue density within the upper outer left breast contiguous with skin thickening and retraction in the left axilla.  She was also noted to have diffuse sclerotic lesions throughout the axial and appendicular skeleton.   Overall picture was noted to be concerning for breast cancer and therefore oncology consultation was requested. Patient has been scheduled for an ultrasound-guided biopsy of her left axillary mass on 06/16/2023.   Patient notes that she has had left upper breast thickening and progressive skin retraction over the last several months.  Even prior to that she had noted a left axillary mass for more than a year and thought she  had an ingrown hair in the axilla and infection which was intermittently draining.  She does not have a primary care physician and did not seek any help or evaluation for this.   She has had poor medical follow-up and does not recollect having had a mammogram in the last 5 years.  She is also not up to speed with her other age-appropriate cancer screening.   Patient notes that she might have lost about 15 to 20 pounds over the last year. Does not note any focal bone pains. Does endorse heavy smoking and alcohol use. Currently does live with other family members.  INTERVAL HISTORY:  Elizabeth Mason is a wonderful 58 y.o. 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  She was seen by me as an inpatient on 06/14/2023 and reported left upper breast thickening and progressive skin retraction, left axillary mass present for over 1 year with intermittent draining, and 15-20 pound weight-loss in 1 year.   Ultrasound-guided biopsy of her left axillary mass lymph node on 06/16/2023 showed Metastatic carcinoma, morphologically compatible with invasive  ductal carcinoma of breast, grade 2, 15 mm.   She is accompanied by her daughter during today's visit.   As part of her evaluation of sepsis, she was found to have an obvious kidney infection. Patient will complete her course of antibiotics tomorrow, 06/26/2023.   Patient denies any fever since discharged from the hospital. She denies any pain in the back, including around the kidney, or issues passing urine.   Patient reports that she fractured her arm  unexpectedly after hitting an Designer, television/film set.   She reports that she had a partial hysterectomy in 1988. Patient still has both of her ovaries in place.   She notes that she did have Menopausal symptoms in her 40s including hot flashes.    Her daughter reports that there were plans for patient to have oral surgery to shave down bones in her mouth and pull all of her teeth.   Her daughter notes that 6 of patient's teeth are fractured and they may need more immediate surgical intervention than her remaining teeth.   Her daughter notes that patient has limited range of motion of upper extremity.   Her daughter notes that she will begin meal prepping for patient.  Her daughter notes that she is also a patient at the cancer center.   MEDICAL HISTORY:    Nicotine  abuse smoker 2 packs/day with more than 75-pack-year history of smoking Heavy alcohol use Depression Poor medical follow-up  SURGICAL HISTORY: Past Surgical History:  Procedure Laterality Date   ABDOMINAL HYSTERECTOMY      SOCIAL HISTORY: Social History   Socioeconomic History   Marital status: Single    Spouse name: Not on file   Number of children: Not on file   Years of education: Not on file   Highest education level: Not on file  Occupational History   Not on file  Tobacco Use   Smoking status: Every Day    Current packs/day: 2.00    Average packs/day: 2.0 packs/day for 38.4 years (76.9 ttl pk-yrs)    Types: Cigarettes    Start date: 76   Smokeless tobacco: Never  Vaping Use   Vaping status: Never Used  Substance and Sexual Activity   Alcohol use: Yes    Alcohol/week: 21.0 standard drinks of alcohol    Types: 21 Cans of beer per week    Comment: occ beer   Drug use: Not Currently    Frequency: 3.0 times per week    Types: Marijuana   Sexual activity: Yes    Birth control/protection: None  Other Topics Concern   Not on file  Social History Narrative   Not on file   Social Drivers of Health   Financial Resource Strain: Not on file  Food Insecurity: No Food Insecurity (06/13/2023)   Hunger Vital Sign    Worried About Running Out of Food in the Last Year: Never true    Ran Out of Food in the Last Year: Never true   Transportation Needs: No Transportation Needs (06/13/2023)   PRAPARE - Administrator, Civil Service (Medical): No    Lack of Transportation (Non-Medical): No  Physical Activity: Not on file  Stress: Not on file  Social Connections: Not on file  Intimate Partner Violence: Not At Risk (06/13/2023)   Humiliation, Afraid, Rape, and Kick questionnaire    Fear of Current or Ex-Partner: No    Emotionally Abused: No    Physically Abused: No    Sexually Abused: No    FAMILY HISTORY: No family history on file. Notes family history of hypertension diabetes dyslipidemia. Does not report any family history of cancers.  ALLERGIES:  has no known allergies.  MEDICATIONS:  Current Outpatient Medications  Medication Sig Dispense Refill   letrozole  (FEMARA ) 2.5 MG tablet Take 1 tablet (2.5 mg total) by mouth daily. 30 tablet 11   ribociclib  succ (KISQALI 400MG  DAILY DOSE) 200 MG Therapy Pack Take 2 tablets (400 mg total) by mouth daily. Take for  21 days on, 7 days off, repeat every 28 days. 42 tablet 1   diphenhydrAMINE  (BENADRYL  ALLERGY) 25 MG tablet Take 1 tablet (25 mg total) by mouth every 6 (six) hours as needed for itching. Caution with drowsiness. 30 tablet 0   FLUoxetine  (PROZAC ) 10 MG capsule Take 1 capsule (10 mg total) by mouth daily. 30 capsule 1   loratadine  (CLARITIN ) 10 MG tablet Take 1 tablet (10 mg total) by mouth daily. 30 tablet 1   traZODone  (DESYREL ) 50 MG tablet Take 1 tablet (50 mg total) by mouth at bedtime as needed for sleep. 30 tablet 1   Vitamin D , Ergocalciferol , (DRISDOL ) 1.25 MG (50000 UNIT) CAPS capsule Take 1 capsule (50,000 Units total) by mouth every 7 (seven) days for 7 doses. 7 capsule 0   No current facility-administered medications for this visit.    REVIEW OF SYSTEMS:    10 Point review of Systems was done is negative except as noted above.  PHYSICAL EXAMINATION: ECOG PERFORMANCE STATUS: 2 - Symptomatic, <50% confined to bed  . Vitals:    06/25/23 0912 06/25/23 0914  BP: (!) 156/90 (!) 153/90  Pulse: 80   Resp: 17   Temp: 97.6 F (36.4 C)   SpO2: 100%    Filed Weights   06/25/23 0912  Weight: 90 lb (40.8 kg)   .Body mass index is 16.46 kg/m.  GENERAL:alert, in no acute distress and comfortable SKIN: no acute rashes, no significant lesions EYES: conjunctiva are pink and non-injected, sclera anicteric OROPHARYNX: MMM, no exudates, no oropharyngeal erythema or ulceration NECK: supple, no JVD LYMPH:  no palpable lymphadenopathy in the cervical, axillary or inguinal regions LUNGS: clear to auscultation b/l with normal respiratory effort HEART: regular rate & rhythm ABDOMEN:  normoactive bowel sounds , non tender, not distended. Extremity: no pedal edema PSYCH: alert & oriented x 3 with fluent speech NEURO: no focal motor/sensory deficits  LABORATORY DATA:  I have reviewed the data as listed  .    Latest Ref Rng & Units 06/25/2023   10:03 AM 06/18/2023    4:22 AM 06/16/2023    3:50 AM  CBC  WBC 4.0 - 10.5 K/uL 7.7  11.4  14.1   Hemoglobin 12.0 - 15.0 g/dL 16.1  09.6  04.5   Hematocrit 36.0 - 46.0 % 37.8  32.2  32.3   Platelets 150 - 400 K/uL 809  361  324     .    Latest Ref Rng & Units 06/25/2023   10:03 AM 06/18/2023    4:22 AM 06/16/2023    3:50 AM  CMP  Glucose 70 - 99 mg/dL 409  811  95   BUN 6 - 20 mg/dL 17  14  <5   Creatinine 0.44 - 1.00 mg/dL 9.14  7.82  9.56   Sodium 135 - 145 mmol/L 134  134  136   Potassium 3.5 - 5.1 mmol/L 4.2  4.2  4.3   Chloride 98 - 111 mmol/L 100  100  105   CO2 22 - 32 mmol/L 24  23  22    Calcium 8.9 - 10.3 mg/dL 21.3  8.8  8.6   Total Protein 6.5 - 8.1 g/dL 9.5  7.1    Total Bilirubin 0.0 - 1.2 mg/dL 0.3  0.4    Alkaline Phos 38 - 126 U/L 130  104    AST 15 - 41 U/L 16  11    ALT 0 - 44 U/L 12  10  06/14/2023 Cancer Antigen 27.29:   06/14/2023 Cancer Antigen 15-3:    RADIOGRAPHIC STUDIES: I have personally reviewed the radiological images as listed and  agreed with the findings in the report. US  RENAL Result Date: 06/17/2023 CLINICAL DATA:  Hydronephrosis. EXAM: RENAL / URINARY TRACT ULTRASOUND COMPLETE COMPARISON:  CT dated 06/13/2023. FINDINGS: Right Kidney: Renal measurements: 9.6 x 3.7 x 4.8 cm = volume: 88 mL. Mild increased echogenicity. No hydronephrosis or shadowing stone. Left Kidney: Renal measurements: 11.0 x 6.6 x 6.3 cm = volume: 239 mL. Mild increased echogenicity. There is mild left hydronephrosis. No shadowing stone. Bladder: Appears normal for degree of bladder distention. Other: None. IMPRESSION: Persistent left hydronephrosis similar to prior CT. Electronically Signed   By: Angus Bark M.D.   On: 06/17/2023 19:26   US  AXILLARY NODE CORE BIOPSY LEFT Result Date: 06/16/2023 INDICATION: 58 year old female with history of left axillary mass in suspicion for metastatic disease of uncertain etiology. EXAM: Ultrasound-guided left axillary mass biopsy MEDICATIONS: None. ANESTHESIA/SEDATION: Moderate (conscious) sedation was employed during this procedure. A total of Versed  2 mg and Fentanyl  100 mcg was administered intravenously. Moderate Sedation Time: 13 minutes. The patient's level of consciousness and vital signs were monitored continuously by radiology nursing throughout the procedure under my direct supervision. FLUOROSCOPY TIME:  None. COMPLICATIONS: None immediate. PROCEDURE: Informed written consent was obtained from the patient after a thorough discussion of the procedural risks, benefits and alternatives. All questions were addressed. Maximal Sterile Barrier Technique was utilized including caps, mask, sterile gowns, sterile gloves, sterile drape, hand hygiene and skin antiseptic. A timeout was performed prior to the initiation of the procedure. Preprocedure ultrasound evaluation demonstrated multifocal left axillary prominent lymph nodes. The largest, more anterior lymph node was targeted for biopsy. The procedure was planned.  Subdermal Local anesthesia was administered at the planned needle entry site with 1% lidocaine . A small skin nick was made. Under direct ultrasound visualization, a 17 gauge coaxial introducer needle was directed to the periphery of the mass. This was followed by acquisition of 2, 18 gauge core biopsy samples which were placed in formalin. The needle was removed. Postprocedure ultrasound evaluation demonstrated no evidence of surrounding hematoma or other complicating features. A sterile bandage was applied. The patient tolerated the procedure well and was transferred back to the floor in good condition. IMPRESSION: Technically successful ultrasound-guided left axillary mass/lymph node biopsy. Creasie Doctor, MD [facility] Electronically Signed   By: Creasie Doctor M.D.   On: 06/16/2023 21:28   MR BRAIN W WO CONTRAST Result Date: 06/15/2023 CLINICAL DATA:  Invasive breast carcinoma staging EXAM: MRI HEAD WITHOUT AND WITH CONTRAST TECHNIQUE: Multiplanar, multiecho pulse sequences of the brain and surrounding structures were obtained without and with intravenous contrast. CONTRAST:  5mL GADAVIST  GADOBUTROL  1 MMOL/ML IV SOLN COMPARISON:  None Available. FINDINGS: Brain: No acute infarct, mass effect or extra-axial collection. Remote microhemorrhage in the right temporal lobe. Normal white matter signal, parenchymal volume and CSF spaces. The midline structures are normal. There is no abnormal contrast enhancement. Vascular: Normal flow voids. Skull and upper cervical spine: Normal calvarium and skull base. Visualized upper cervical spine and soft tissues are normal. Sinuses/Orbits:Right mastoid fluid. Paranasal sinuses are clear. Normal orbits. IMPRESSION: No intracranial metastatic disease. Electronically Signed   By: Juanetta Nordmann M.D.   On: 06/15/2023 19:22   US  LT UPPER EXTREM LTD SOFT TISSUE NON VASCULAR Result Date: 06/13/2023 CLINICAL DATA:  Left  axillary lymphadenopathy. EXAM: ULTRASOUND LEFT UPPER EXTREMITY  LIMITED TECHNIQUE: Ultrasound examination of the upper extremity soft tissues was performed in the area of clinical concern. COMPARISON:  06/13/2023 FINDINGS: A soft tissue mass is noted in the left axilla measuring 2.7 x 1.1 x 2.9 cm with internal vascularity. IMPRESSION: A soft tissue mass in the left axilla measuring up to 2.9 cm with internal vascularity. Findings may represent metastatic disease versus lymphadenopathy. Correlation with diagnostic mammography is recommended given history of breast cancer. Electronically Signed   By: Wyvonnia Heimlich M.D.   On: 06/13/2023 17:23   CT CHEST ABDOMEN PELVIS W CONTRAST Result Date: 06/13/2023 CLINICAL DATA:  Metastatic evaluation, nausea and vomiting, abdominal pain and lethargy, sclerotic lesions on recent CT concerning for bony metastases EXAM: CT CHEST, ABDOMEN, AND PELVIS WITH CONTRAST TECHNIQUE: Multidetector CT imaging of the chest, abdomen and pelvis was performed following the standard protocol during bolus administration of intravenous contrast. RADIATION DOSE REDUCTION: This exam was performed according to the departmental dose-optimization program which includes automated exposure control, adjustment of the mA and/or kV according to patient size and/or use of iterative reconstruction technique. CONTRAST:  80mL OMNIPAQUE  IOHEXOL  300 MG/ML  SOLN COMPARISON:  06/12/2023, 09/18/2021 FINDINGS: CT CHEST FINDINGS Cardiovascular: The heart is unremarkable without pericardial effusion. No evidence of thoracic aortic aneurysm or dissection. Mediastinum/Nodes: Thyroid, trachea, and esophagus are unremarkable. There is a small hiatal hernia. No pathologic mediastinal, hilar, or axillary adenopathy. Lungs/Pleura: No acute airspace disease, effusion, or pneumothorax. Central airways are patent. Musculoskeletal: Numerous sclerotic foci are again noted within the axial and appendicular skeleton, most  pronounced within the sternum, thoracic spine, thoracic cage, and left humeral head. 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 There are no acute displaced fractures. There is asymmetric enhancing soft tissue within the upper and outer aspects of the left breast, contiguous with the skin thickening and retraction in the left axilla seen on prior study. Correlation with mammographic evaluation is recommended to assess for underlying breast cancer. Reconstructed images demonstrate no additional findings. CT ABDOMEN PELVIS FINDINGS Hepatobiliary: There is a 2.2 cm hemangioma seen within the superior left lobe liver reference image 44/2. No other focal liver abnormalities. The gallbladder is unremarkable. No biliary duct dilation. Pancreas: Unremarkable. No pancreatic ductal dilatation or surrounding inflammatory changes. Spleen: Normal in size without focal abnormality. Adrenals/Urinary Tract: Enlarged edematous left kidney is again identified, with stable hydronephrosis. Heterogeneous enhancement of the left renal parenchyma is noted, especially on delayed images. There is delayed excretion of contrast. No radiopaque calculus or obstructing soft tissue mass is identified on this exam. The right kidney enhances normally. The bladder is moderately distended, with normal right ureteral jet identified on delayed imaging. The left ureteral jet is not visualized. Right adrenal is unremarkable. There is nonspecific thickening of the left adrenal gland, measuring 2 cm in thickness with attenuation of 75 HU. Stomach/Bowel: No bowel obstruction or ileus. Normal appendix right lower quadrant. No bowel wall thickening or inflammatory change. Small hiatal hernia. Vascular/Lymphatic: Aortic atherosclerosis. No enlarged abdominal or pelvic lymph nodes. Reproductive: Prior hysterectomy.  No adnexal masses. Other: Trace pelvic free fluid. No free intraperitoneal gas. No abdominal wall hernia. Musculoskeletal: Multiple sclerotic  lesions are again noted throughout the axial and appendicular skeleton, most prominent within the lumbosacral spine and iliac bones, consistent with sclerotic metastases. Reconstructed images demonstrate no additional findings. IMPRESSION: 1. Enlarged edematous left kidney, with heterogeneous enhancement and delayed excretion of contrast, consistent with pyelonephritis. No evidence of renal abscess. 2. Persistent left-sided hydronephrosis and hydroureter likely due to pyelonephritis.  No radiopaque calculus or obstructing mass identified. If further evaluation is desired, retrograde evaluation could be considered when clinical situation permits. 3. Diffuse sclerotic lesions throughout the axial and appendicular skeleton, unchanged, consistent with sclerotic metastases. 4. Abnormal soft tissue density within the upper-outer left breast, contiguous with the skin thickening and retraction in the left axilla reported previously. Given sclerotic metastases, underlying breast cancer is a concern. Correlation with mammographic evaluation recommended. 5. Trace pelvic free fluid, nonspecific. 6.  Aortic Atherosclerosis (ICD10-I70.0). Electronically Signed   By: Bobbye Burrow M.D.   On: 06/13/2023 16:25   CT CHEST ABDOMEN PELVIS WO CONTRAST Result Date: 06/13/2023 CLINICAL DATA:  Sepsis EXAM: CT CHEST, ABDOMEN AND PELVIS WITHOUT CONTRAST TECHNIQUE: Multidetector CT imaging of the chest, abdomen and pelvis was performed following the standard protocol without IV contrast. RADIATION DOSE REDUCTION: This exam was performed according to the departmental dose-optimization program which includes automated exposure control, adjustment of the mA and/or kV according to patient size and/or use of iterative reconstruction technique. COMPARISON:  Same day chest radiograph; CT chest 09/18/2021 FINDINGS: CT CHEST FINDINGS Cardiovascular: No pericardial effusion. Normal caliber thoracic aorta. Mediastinum/Nodes: Trachea and esophagus are  unremarkable. No thoracic adenopathy. Lungs/Pleura: No focal consolidation, pleural effusion, or pneumothorax. Musculoskeletal: Multiple sclerotic lesions throughout the thoracic vertebral bodies, sternum, and bilateral ribs. For example 12 mm lesion in the mid sternum (series 10/image 74) and 17 x 15 mm lesion in the T8 vertebral body. No pathologic fracture. Partially visualized soft tissue mass in the left axilla with skin retraction measuring approximately 4.0 x 2.3 cm in the axial plane (series 2/image 12). Additional focal soft tissue thickening in the right axilla on series 2/image 16 measuring 7 x 13 mm. These findings are similar to 09/18/2021. CT ABDOMEN PELVIS FINDINGS Hepatobiliary: Unremarkable noncontrast appearance of the liver, gallbladder, and biliary tree. Pancreas: Unremarkable. Spleen: Unremarkable. Adrenals/Urinary Tract: The adrenal glands are poorly visualized without IV contrast. Unremarkable right kidney. Mild right hydronephrosis. No obstructing stone. Unremarkable bladder. Stomach/Bowel: Normal caliber large and small bowel. No bowel wall thickening. Stomach and appendix are within normal limits. Vascular/Lymphatic: Aortic atherosclerosis. No enlarged abdominal or pelvic lymph nodes. Reproductive: Hysterectomy.  No adnexal mass. Other: No free intraperitoneal fluid or air. Musculoskeletal: No acute fracture. Numerous sclerotic lesions are present throughout the axial and visualized appendicular skeleton. For example a 13 mm sclerotic lesion in the L3 vertebral body IMPRESSION: 1. Numerous sclerotic lesions throughout the axial and visualized appendicular skeleton new compared 09/18/2021, suggestive of metastatic disease of unknown primary. 2. Edematous left kidney with left hydronephrosis new since 2023. No radiopaque stone. Given findings concerning for osseous metastases, CT abdomen pelvis with IV contrast and including a renal excretory phase is recommended for further evaluation. 3.  Partially visualized soft tissue mass in the left axilla with skin retraction measuring approximately 4.0 x 2.3 cm in the axial plane. Additional focal soft tissue thickening in the right axilla measuring 7 x 13 mm. These findings are similar to 09/18/2021 differential considerations include infectious/inflammatory process such as hydradenitis suppurativa or malignancy. Clinical and if appropriate histologic correlation is recommended. 4. Aortic Atherosclerosis (ICD10-I70.0). Electronically Signed   By: Rozell Cornet M.D.   On: 06/13/2023 00:21   DG Chest Port 1 View Result Date: 06/12/2023 CLINICAL DATA:  Questionable sepsis-evaluate for abnormality EXAM: PORTABLE CHEST 1 VIEW COMPARISON:  Radiographs 04/10/2021 FINDINGS: Stable cardiomediastinal silhouette. No focal consolidation, pleural effusion, or pneumothorax. No displaced rib fractures. IMPRESSION: No active disease. Electronically Signed   By:  Rozell Cornet M.D.   On: 06/12/2023 23:14   06/16/2023 Ultrasound-guided biopsy of her left axillary mass:    ASSESSMENT & PLAN:  58 year old female with poor overall medical follow-up with   #1 stage IV metastatic Invasive ductal carcinoma that is estrogen receptor and progesterone receptor positive, and HER2 negative with bone mets.  Left upper breast mass with skin changes and left axillary lymphadenopathy.  Also noted to have diffuse sclerotic bone metastases.   #2 left-sided pyelonephritis with left-sided hydronephrosis and hydroureter.    #3 hx sepsis due to pyelonephritis.   #4 heavy smoker more than 75-pack-year history of cigarette smoking   #5 history of heavy alcohol use 6-10 beers daily.   PLAN  -tumor markers are significantly elevated -CA 27.29 193.3 U/mL -CA 15-3 158 U/mL -06/13/2023 CT chest/abdm/pelvis scan W contrast showed: 1. Enlarged edematous left kidney, with heterogeneous enhancement and delayed excretion of contrast, consistent with pyelonephritis. No evidence  of renal abscess. 2. Persistent left-sided hydronephrosis and hydroureter likely due to pyelonephritis. No radiopaque calculus or obstructing mass identified. If further evaluation is desired, retrograde evaluation could be considered when clinical situation permits. 3. Diffuse sclerotic lesions throughout the axial and appendicular skeleton, unchanged, consistent with sclerotic metastases. 4. Abnormal soft tissue density within the upper-outer left breast, contiguous with the skin thickening and retraction in the left axilla reported previously. Given sclerotic metastases, underlying breast cancer is a concern. Correlation with mammographic evaluation recommended. 5. Trace pelvic free fluid, nonspecific. 6.  Aortic Atherosclerosis (ICD10-I70.0). 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 -discussed that since her breast cancer has spread beyond the breast and local lymph nodes, it is classified as stage IV disease, which is treatable, but is not curable -discussed that compared to other subtypes of breast cancer, she does not have the most aggressive version of breast cancer -discussed that her Invasive ductal carcinoma is predominantly hormone driven, which we can block with treatment -discussed that there is no visceral crisis -there is no indication for chemotherapy at this point  -discussed that her bone lesions are not very large to the extent of immediately causing a fracture.  -will order labs today -will order hormone profiles to ensure her hormones are suggestive that she is in menopause -assuming that patient is post-menopause, her treatment regimen would be a combination of Aromatase inhibitor, Letrozole , which would work to  block estrogen production in the body after menopause, combined with CDK4/6 inhibitor, Kisquali -discussed that Aromatase inhibitors are generally tolerated well and there should not be much concern for hot flashes post-menopause, though there can be temporary hot flashes if she is close to menopause -discussed that with CDK4/6 inhibitors, there is a risk of infections to some degree with lower WBCs, and we can adjust medication dose if needed. Also discussed possible side effect of diarrhea.  -will order Aromatase inhibitor and CDK4/6 inhibitor medication -discussed that there would be a role to monitor her liver functions as well -discussed that if she responds well to treatment and takes her medication regularly, her breast cancer could potentially be controlled for years -recommend patient to eat as well as she can, stay physically active, and cut smoking and alcohol -discussed that cutting down alcohol use can cause some element of flushing/hot flashes as  well -discussed that cutting out smoking and alcohol use as her tumor is suppressed with treatment may help her eat better and gain some weight -discussed that there would be a role for bone density study as treatment may also weaken the bones -will order bone density scan and baseline mammogram to finish staging -discussed that there is a need for dental clearance prior to bone strengthening medication consideration to ensure there are no major dental issues. Discussed concern that bone strengthening medication can harden the jaw and not allow the jaw bone to heal quickly -will not add bone strengthening medication at this time. Discussed that the risk of osteonecrosis of the jaw increases with dental issues.  -discussed concern that dental issues could be a source of infection to some extent -Discussed that we may need to reasses the risk vs benefits of bone-strengthening medication down the line after her dental issues are addressed.   -discussed that if her left axillary is uncomfortable, there may be a role for local Palliative radiation -discussed that if patient has very good response to treatment and if there is not much disease remaining, there may be a role for local surgery for removal of her primary tumor.  -discussed that there is some uncertainty in regards to whether or not her tumor can be surgically resected due to infiltration, and this would depend on how much her tumor shrinks -discussed that there would be a need for smoking cessation for considerations of surgery  -answered all of patient's and her daughter's questions in detail  FOLLOW-UP: Labs today MMG in 1-2 weeks DEXA scan in 1-2 weeks RTC with Dr Salomon Cree with labs in 4 weeks  The total time spent in the appointment was 42 minutes* .  All of the patient's questions were answered with apparent satisfaction. The patient knows to call the clinic with any problems, questions or concerns.   Jacquelyn Matt MD MS AAHIVMS [facility] Hematology/Oncology Physician [facility]  .*Total Encounter Time as defined by the Centers for Medicare and Medicaid Services includes, in addition to the face-to-face time of a patient visit (documented in the note above) non-face-to-face time: obtaining and reviewing outside history, ordering and reviewing medications, tests or procedures, care coordination (communications with other health care professionals or caregivers) and documentation in the medical record.   I,Mitra Faeizi,acting as a Neurosurgeon for Jacquelyn Matt, MD.,have documented all relevant documentation on the behalf of Jacquelyn Matt, MD,as directed by  Jacquelyn Matt, MD while in the presence of Jacquelyn Matt, MD.   .I have reviewed the above documentation for accuracy and completeness, and I agree with the above. .Chavon Lucarelli Kishore Pritika Alvarez MD

## 2023-06-25 ENCOUNTER — Other Ambulatory Visit (HOSPITAL_COMMUNITY): Payer: Self-pay

## 2023-06-25 ENCOUNTER — Inpatient Hospital Stay: Payer: MEDICAID

## 2023-06-25 ENCOUNTER — Telehealth: Payer: Self-pay | Admitting: Pharmacy Technician

## 2023-06-25 ENCOUNTER — Telehealth: Payer: Self-pay

## 2023-06-25 ENCOUNTER — Inpatient Hospital Stay: Payer: MEDICAID | Attending: Hematology | Admitting: Hematology

## 2023-06-25 VITALS — BP 153/90 | HR 80 | Temp 97.6°F | Resp 17 | Ht 62.0 in | Wt 90.0 lb

## 2023-06-25 DIAGNOSIS — Z79899 Other long term (current) drug therapy: Secondary | ICD-10-CM | POA: Insufficient documentation

## 2023-06-25 DIAGNOSIS — C7951 Secondary malignant neoplasm of bone: Secondary | ICD-10-CM

## 2023-06-25 DIAGNOSIS — Z170 Estrogen receptor positive status [ER+]: Secondary | ICD-10-CM

## 2023-06-25 DIAGNOSIS — N1330 Unspecified hydronephrosis: Secondary | ICD-10-CM | POA: Insufficient documentation

## 2023-06-25 DIAGNOSIS — F17210 Nicotine dependence, cigarettes, uncomplicated: Secondary | ICD-10-CM | POA: Insufficient documentation

## 2023-06-25 DIAGNOSIS — Z1721 Progesterone receptor positive status: Secondary | ICD-10-CM | POA: Insufficient documentation

## 2023-06-25 DIAGNOSIS — C773 Secondary and unspecified malignant neoplasm of axilla and upper limb lymph nodes: Secondary | ICD-10-CM | POA: Insufficient documentation

## 2023-06-25 DIAGNOSIS — F1090 Alcohol use, unspecified, uncomplicated: Secondary | ICD-10-CM | POA: Insufficient documentation

## 2023-06-25 DIAGNOSIS — C50412 Malignant neoplasm of upper-outer quadrant of left female breast: Secondary | ICD-10-CM | POA: Insufficient documentation

## 2023-06-25 DIAGNOSIS — Z1732 Human epidermal growth factor receptor 2 negative status: Secondary | ICD-10-CM | POA: Insufficient documentation

## 2023-06-25 LAB — CBC WITH DIFFERENTIAL (CANCER CENTER ONLY)
Abs Immature Granulocytes: 0.04 10*3/uL (ref 0.00–0.07)
Basophils Absolute: 0.1 10*3/uL (ref 0.0–0.1)
Basophils Relative: 1 %
Eosinophils Absolute: 0 10*3/uL (ref 0.0–0.5)
Eosinophils Relative: 0 %
HCT: 37.8 % (ref 36.0–46.0)
Hemoglobin: 12.9 g/dL (ref 12.0–15.0)
Immature Granulocytes: 1 %
Lymphocytes Relative: 25 %
Lymphs Abs: 1.9 10*3/uL (ref 0.7–4.0)
MCH: 32.8 pg (ref 26.0–34.0)
MCHC: 34.1 g/dL (ref 30.0–36.0)
MCV: 96.2 fL (ref 80.0–100.0)
Monocytes Absolute: 0.5 10*3/uL (ref 0.1–1.0)
Monocytes Relative: 6 %
Neutro Abs: 5.2 10*3/uL (ref 1.7–7.7)
Neutrophils Relative %: 67 %
Platelet Count: 809 10*3/uL — ABNORMAL HIGH (ref 150–400)
RBC: 3.93 MIL/uL (ref 3.87–5.11)
RDW: 11.9 % (ref 11.5–15.5)
Smear Review: NORMAL
WBC Count: 7.7 10*3/uL (ref 4.0–10.5)
nRBC: 0 % (ref 0.0–0.2)

## 2023-06-25 LAB — CMP (CANCER CENTER ONLY)
ALT: 12 U/L (ref 0–44)
AST: 16 U/L (ref 15–41)
Albumin: 4.6 g/dL (ref 3.5–5.0)
Alkaline Phosphatase: 130 U/L — ABNORMAL HIGH (ref 38–126)
Anion gap: 10 (ref 5–15)
BUN: 17 mg/dL (ref 6–20)
CO2: 24 mmol/L (ref 22–32)
Calcium: 10.1 mg/dL (ref 8.9–10.3)
Chloride: 100 mmol/L (ref 98–111)
Creatinine: 1.24 mg/dL — ABNORMAL HIGH (ref 0.44–1.00)
GFR, Estimated: 50 mL/min — ABNORMAL LOW (ref >60)
Glucose, Bld: 111 mg/dL — ABNORMAL HIGH (ref 70–99)
Potassium: 4.2 mmol/L (ref 3.5–5.1)
Sodium: 134 mmol/L — ABNORMAL LOW (ref 135–145)
Total Bilirubin: 0.3 mg/dL (ref 0.0–1.2)
Total Protein: 9.5 g/dL — ABNORMAL HIGH (ref 6.5–8.1)

## 2023-06-25 LAB — VITAMIN D 25 HYDROXY (VIT D DEFICIENCY, FRACTURES): Vit D, 25-Hydroxy: 66.99 ng/mL (ref 30–100)

## 2023-06-25 MED ORDER — RIBOCICLIB SUCC (400 MG DOSE) 200 MG PO TBPK
400.0000 mg | ORAL_TABLET | Freq: Every day | ORAL | 1 refills | Status: DC
  Filled 2023-07-03: qty 42, 28d supply, fill #0
  Filled 2023-07-27: qty 42, 28d supply, fill #1

## 2023-06-25 MED ORDER — LETROZOLE 2.5 MG PO TABS
2.5000 mg | ORAL_TABLET | Freq: Every day | ORAL | 11 refills | Status: DC
  Filled 2023-06-25: qty 30, 30d supply, fill #0

## 2023-06-25 NOTE — Telephone Encounter (Signed)
 Oral Oncology Patient Advocate Encounter  After completing a benefits investigation, Patient has no insurance currently. Her only option would be patient assistance.  Roda Cirri, CPhT Specialty Pharmacy Patient Advocate Phone: [phone] Fax: [phone]

## 2023-06-25 NOTE — Telephone Encounter (Signed)
 Oral Oncology Pharmacist Encounter  Received new prescription for Kisqali (ribociclib) for the treatment of stage IV, invasive ductal carcinoma that is ER/PR positive and HER2 negative in conjunction with letrozole, planned duration until disease progression or unacceptable toxicity.  Labs from 06/25/2023 (CBC and CMP) assessed, patients creatinine clearance is 31.85 due to a creatinine of 1.24. No adjustments needed as adjustments only needed for a creatinine clearance <30. Prescription dose and frequency assessed for appropriateness.   Current medication list in Epic reviewed, DDIs with Kisqali identified: - trazodone  (cat C): kisqali may increase the serum concentration of trazodone . Will notify patient to monitor for increased trazodone  effects including sedation.   - benadryl , fluoxetine , loratadine  (all cat B): can increase risk of Qtc prolonging effects. No action is needed as they are category B. EKG can be considered in patients that are at higher rik of QT interval prolongation. Patient did have EKG completed on 06/17/23.   Evaluated chart and possible patient barriers to medication adherence noted. Prescription has been e-scribed to the [facility] for benefits analysis and approval.  Oral Oncology Clinic will continue to follow for insurance authorization, copayment issues, initial counseling and start date.  Dashan Chizmar, PharmD Hematology/Oncology Clinical Pharmacist [facility] Oral Chemotherapy Navigation Clinic [phone] 06/25/2023 10:27 AM

## 2023-06-25 NOTE — Telephone Encounter (Signed)
 Oral Oncology Patient Advocate Encounter   Began application for assistance for Kisqali through [employer] Patient [employer].   Application will be submitted upon completion of necessary supporting documentation.  (Called pt and left a voice message to call us  back) We need to go over the form for patient assistance and get her to sign it. Will need email address if she    [employer] Patient Support phone number [phone].   I will continue to check the status until final determination.   Roda Cirri, CPhT Specialty Pharmacy Patient Advocate Phone: [phone] Fax: [phone]

## 2023-06-26 LAB — ESTRADIOL: Estradiol: 50.9 pg/mL

## 2023-06-26 LAB — FOLLICLE STIMULATING HORMONE: FSH: 80.5 m[IU]/mL

## 2023-06-26 LAB — LUTEINIZING HORMONE: LH: 43.7 m[IU]/mL

## 2023-06-26 NOTE — Telephone Encounter (Signed)
 Called and spoke with [name] (patient's daughter). [name] would like to come by the office today, 06/26/23, to sign application. Once Patient and Provider signatures are obtained, application will be sent in to [facility] Patient Assistance [employer] for processing. Oral Oncology Clinic will continue to follow and update until final determination.    Hansel Ley, CPhT Pharmacy Technician Coordinator [facility] [phone] (Ph) 06/26/2023 12:31 PM

## 2023-06-26 NOTE — Telephone Encounter (Signed)
 Oral Oncology Patient Advocate Encounter   Submitted application for assistance for Kisqali to [employer] for triage to [employer].   Application submitted via e-fax to [phone]   [employer] Patient Support's phone number [phone].   I will continue to check the status until final determination.    Hansel Ley, CPhT Pharmacy Technician Coordinator [facility] [phone] (Ph) 06/26/2023 3:30 PM

## 2023-06-30 ENCOUNTER — Other Ambulatory Visit (HOSPITAL_COMMUNITY): Payer: Self-pay

## 2023-07-01 NOTE — Telephone Encounter (Signed)
 Received notification via fax from [employer] the application has been triaged to [employer] for final review and processing. Oral Oncology Clinic will continue to follow and update until final determination.   [facility]'s Phone [phone]   Hansel Ley, CPhT Supervisor Pharmacy Patient Advocate [facility] [phone] (Ph) 07/01/2023 8:45 AM

## 2023-07-03 ENCOUNTER — Other Ambulatory Visit: Payer: Self-pay

## 2023-07-03 ENCOUNTER — Other Ambulatory Visit (HOSPITAL_COMMUNITY): Payer: Self-pay

## 2023-07-03 ENCOUNTER — Telehealth: Payer: Self-pay

## 2023-07-03 NOTE — Progress Notes (Signed)
 Specialty Pharmacy Initial Fill Coordination Note  Elizabeth Mason is a 57 y.o. female contacted today regarding initial fill of specialty medication(s) Ribociclib  Succinate (KISQALI 400mg  Daily Dose)  Patient requested Pickup at [facility] date: 07/06/23  Medication will be filled on 07/03/23.   Patient is aware of $0.00 copayment.    Hansel Ley, CPhT Supervisor Pharmacy Patient Advocate [facility] [phone] (Ph) 07/03/2023 9:29 AM

## 2023-07-03 NOTE — Progress Notes (Signed)
 Patient counseled in telephone encounter opened on 06/25/23.   Elizabeth Mason, PharmD Hematology/Oncology Clinical Pharmacist [facility] [phone]

## 2023-07-03 NOTE — Telephone Encounter (Signed)
 Oral Chemotherapy Pharmacist Encounter  I spoke with patient and patients daughter for overview of: Kisqali for the treatment of stage IV, hormone-receptor positive, Her-2 receptor negative breast cancer, in combination with letrozole , planned duration until disease progression or unacceptable toxicity.   Counseled patient on administration, dosing, side effects, monitoring, drug-food interactions, safe handling, storage, and disposal.  Patient will take Kisqali 200mg  tablets, 2 tablets (400mg ) by mouth once daily without regard to food in the morning. Patient will take Kisqali for 3 weeks on, 1 week off, repeat every 4 weeks. Patient knows to avoid grapefruit, pomegranate and grapefruit/ pomegranate juice.  Kisqali start date: 07/07/2023  Patient will need EKG on 07/22/2023 (~C1D14) and then repeat EKG on 08/04/2023 (~C2D1) per manufacturer recomendations. Electrolytes will also be closely monitored at treatment initiation.  Adverse effects include but are not limited to: nausea, vomiting, diarrhea, constipation, fatigue, rash, decreased blood counts, and altered cardiac conduction. Severe, life-threatening, and/or fatal interstitial lung disease (ILD) and/or pneumonitis may occur with CDK 4/6 inhibitors.  Patient does not have an antiemetic but if patient is feeling any nausea she will call the office for an antiemetic to be sent in. Patient will obtain anti diarrheal and alert the office of 4 or more loose stools above baseline.  Reviewed with patient importance of keeping a medication schedule and plan for any missed doses. No barriers to medication adherence identified.  Medication reconciliation performed and medication/allergy list updated.  All questions answered. Patient voiced understanding and appreciation.   Medication education handout placed in mail for patient. Patient knows to call the office with questions or concerns. Oral Chemotherapy Clinic phone number provided to patient.  Patient has follow up visit on 07/22/23 with MD and lab. Notified RN that a repeat EKG (~day 14 on cycle 1) on that day as well.  Jayliani Wanner, PharmD Hematology/Oncology Clinical Pharmacist [place] Oral Chemotherapy Navigation Clinic [phone] 07/03/2023   1:52 PM

## 2023-07-03 NOTE — Telephone Encounter (Signed)
 Oral Oncology Patient Advocate Encounter   Was successful in obtaining a 1 month voucher card for [facility].  This voucher card will make the patients copay $0.00 for the first month.  The billing information is as follows and has been shared with [facility].   RxBin: [id number] PCN: [id number] Member ID: [id number] Group ID: [id number]   Hansel Ley, CPhT Supervisor Pharmacy Patient Advocate [facility] [phone] (Ph) 07/03/2023 8:33 AM

## 2023-07-03 NOTE — Progress Notes (Signed)
 Patient is aware of $4.00 copayment.

## 2023-07-06 ENCOUNTER — Other Ambulatory Visit (HOSPITAL_COMMUNITY): Payer: Self-pay

## 2023-07-06 ENCOUNTER — Other Ambulatory Visit: Payer: Self-pay

## 2023-07-06 NOTE — Telephone Encounter (Signed)
 Disregard - Pt now has Medicaid and doesn't need the pt assistance.

## 2023-07-10 ENCOUNTER — Other Ambulatory Visit: Payer: Self-pay | Admitting: Hematology

## 2023-07-10 DIAGNOSIS — Z170 Estrogen receptor positive status [ER+]: Secondary | ICD-10-CM

## 2023-07-10 DIAGNOSIS — C7951 Secondary malignant neoplasm of bone: Secondary | ICD-10-CM

## 2023-07-16 ENCOUNTER — Other Ambulatory Visit: Payer: Self-pay

## 2023-07-16 ENCOUNTER — Other Ambulatory Visit (HOSPITAL_COMMUNITY): Payer: Self-pay

## 2023-07-16 MED ORDER — VITAMIN D (ERGOCALCIFEROL) 1.25 MG (50000 UNIT) PO CAPS
50000.0000 [IU] | ORAL_CAPSULE | ORAL | 0 refills | Status: AC
  Filled 2023-07-16: qty 7, 49d supply, fill #0

## 2023-07-21 ENCOUNTER — Other Ambulatory Visit (HOSPITAL_COMMUNITY): Payer: Self-pay

## 2023-07-21 ENCOUNTER — Other Ambulatory Visit: Payer: Self-pay

## 2023-07-21 DIAGNOSIS — Z170 Estrogen receptor positive status [ER+]: Secondary | ICD-10-CM

## 2023-07-22 ENCOUNTER — Ambulatory Visit
Admission: RE | Admit: 2023-07-22 | Discharge: 2023-07-22 | Disposition: A | Discharge status: Home / Self Care | Payer: MEDICAID | Source: Ambulatory Visit | Attending: Hematology | Admitting: Hematology

## 2023-07-22 ENCOUNTER — Other Ambulatory Visit: Payer: Self-pay | Admitting: Hematology

## 2023-07-22 ENCOUNTER — Other Ambulatory Visit: Payer: Self-pay

## 2023-07-22 ENCOUNTER — Ambulatory Visit
Admission: RE | Admit: 2023-07-22 | Discharge: 2023-07-22 | Disposition: A | Discharge status: Home / Self Care | Payer: MEDICAID | Source: Ambulatory Visit | Attending: Hematology

## 2023-07-22 ENCOUNTER — Inpatient Hospital Stay: Payer: MEDICAID | Admitting: Hematology

## 2023-07-22 ENCOUNTER — Inpatient Hospital Stay: Payer: MEDICAID | Attending: Hematology

## 2023-07-22 VITALS — BP 148/91 | HR 93 | Temp 97.3°F | Resp 18 | Wt 90.3 lb

## 2023-07-22 DIAGNOSIS — C7951 Secondary malignant neoplasm of bone: Secondary | ICD-10-CM | POA: Diagnosis not present

## 2023-07-22 DIAGNOSIS — C50412 Malignant neoplasm of upper-outer quadrant of left female breast: Secondary | ICD-10-CM

## 2023-07-22 DIAGNOSIS — F17210 Nicotine dependence, cigarettes, uncomplicated: Secondary | ICD-10-CM | POA: Insufficient documentation

## 2023-07-22 DIAGNOSIS — Z170 Estrogen receptor positive status [ER+]: Secondary | ICD-10-CM | POA: Insufficient documentation

## 2023-07-22 DIAGNOSIS — R109 Unspecified abdominal pain: Secondary | ICD-10-CM | POA: Insufficient documentation

## 2023-07-22 DIAGNOSIS — Z1732 Human epidermal growth factor receptor 2 negative status: Secondary | ICD-10-CM | POA: Insufficient documentation

## 2023-07-22 DIAGNOSIS — C792 Secondary malignant neoplasm of skin: Secondary | ICD-10-CM | POA: Insufficient documentation

## 2023-07-22 DIAGNOSIS — R110 Nausea: Secondary | ICD-10-CM | POA: Insufficient documentation

## 2023-07-22 DIAGNOSIS — Z79899 Other long term (current) drug therapy: Secondary | ICD-10-CM | POA: Insufficient documentation

## 2023-07-22 DIAGNOSIS — Z79811 Long term (current) use of aromatase inhibitors: Secondary | ICD-10-CM | POA: Insufficient documentation

## 2023-07-22 DIAGNOSIS — R609 Edema, unspecified: Secondary | ICD-10-CM | POA: Insufficient documentation

## 2023-07-22 DIAGNOSIS — R232 Flushing: Secondary | ICD-10-CM | POA: Insufficient documentation

## 2023-07-22 DIAGNOSIS — Z1721 Progesterone receptor positive status: Secondary | ICD-10-CM | POA: Insufficient documentation

## 2023-07-22 LAB — CBC WITH DIFFERENTIAL (CANCER CENTER ONLY)
Abs Immature Granulocytes: 0.01 10*3/uL (ref 0.00–0.07)
Basophils Absolute: 0 10*3/uL (ref 0.0–0.1)
Basophils Relative: 1 %
Eosinophils Absolute: 0 10*3/uL (ref 0.0–0.5)
Eosinophils Relative: 1 %
HCT: 34.3 % — ABNORMAL LOW (ref 36.0–46.0)
Hemoglobin: 11.6 g/dL — ABNORMAL LOW (ref 12.0–15.0)
Immature Granulocytes: 0 %
Lymphocytes Relative: 26 %
Lymphs Abs: 1.1 10*3/uL (ref 0.7–4.0)
MCH: 32.1 pg (ref 26.0–34.0)
MCHC: 33.8 g/dL (ref 30.0–36.0)
MCV: 95 fL (ref 80.0–100.0)
Monocytes Absolute: 0.2 10*3/uL (ref 0.1–1.0)
Monocytes Relative: 4 %
Neutro Abs: 2.8 10*3/uL (ref 1.7–7.7)
Neutrophils Relative %: 68 %
Platelet Count: 452 10*3/uL — ABNORMAL HIGH (ref 150–400)
RBC: 3.61 MIL/uL — ABNORMAL LOW (ref 3.87–5.11)
RDW: 13.8 % (ref 11.5–15.5)
Smear Review: NORMAL
WBC Count: 4.1 10*3/uL (ref 4.0–10.5)
nRBC: 0 % (ref 0.0–0.2)

## 2023-07-22 LAB — MAGNESIUM: Magnesium: 2.3 mg/dL (ref 1.7–2.4)

## 2023-07-22 LAB — CMP (CANCER CENTER ONLY)
ALT: 10 U/L (ref 0–44)
AST: 11 U/L — ABNORMAL LOW (ref 15–41)
Albumin: 4.2 g/dL (ref 3.5–5.0)
Alkaline Phosphatase: 148 U/L — ABNORMAL HIGH (ref 38–126)
Anion gap: 9 (ref 5–15)
BUN: 10 mg/dL (ref 6–20)
CO2: 28 mmol/L (ref 22–32)
Calcium: 9.9 mg/dL (ref 8.9–10.3)
Chloride: 100 mmol/L (ref 98–111)
Creatinine: 1.17 mg/dL — ABNORMAL HIGH (ref 0.44–1.00)
GFR, Estimated: 54 mL/min — ABNORMAL LOW (ref >60)
Glucose, Bld: 106 mg/dL — ABNORMAL HIGH (ref 70–99)
Potassium: 3.8 mmol/L (ref 3.5–5.1)
Sodium: 137 mmol/L (ref 135–145)
Total Bilirubin: 0.3 mg/dL (ref 0.0–1.2)
Total Protein: 8.7 g/dL — ABNORMAL HIGH (ref 6.5–8.1)

## 2023-07-22 NOTE — Progress Notes (Signed)
 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  HISTORY OF PRESENTING ILLNESS:   Elizabeth Mason is a wonderful 58 y.o. female who has been referred to us  by Dr Davia for evaluation and management of possible metastatic breast cancer with bone mets.   Patient has a history of smoking 1 to 2 packs/day with more than 75-pack-year history of smoking, heavy alcohol use drinks 6-8 beers daily, depression and poor overall medical follow-up. Patient presented on 06/09/2023 with shaking chills, fever abdominal pain nausea and vomiting. She was diagnosed with sepsis likely due to left renal pyelonephritis and has been started on antibiotics for that. Patient had a CT chest abdomen pelvis on 06/09/2023 to evaluate her abdominal pain and this enlarged edematous left kidney consistent with pyelonephritis without renal abscess.  She was also noted to have left-sided hydronephrosis and hydroureter without any overt calculi or obstructing masses identified.  Patient was also incidentally noted to have soft tissue density within the upper outer left breast contiguous with skin thickening and retraction in the left axilla.  She was also noted to have diffuse sclerotic lesions throughout the axial and appendicular skeleton.   Overall picture was noted to be concerning for breast cancer and therefore oncology consultation was requested. Patient has been scheduled for an ultrasound-guided biopsy of her left axillary mass on 06/16/2023.   Patient notes that she has had left upper breast thickening and progressive skin retraction over the last several months.  Even prior to that she had noted a left axillary mass for more than a year and thought she had an  ingrown hair in the axilla and infection which was intermittently draining.  She does not have a primary care physician and did not seek any help or evaluation for this.   She has had poor medical follow-up and does not recollect having had a mammogram in the last 5 years.  She is also not up to speed with her other age-appropriate cancer screening.   Patient notes that she might have lost about 15 to 20 pounds over the last year. Does not note any focal bone pains. Does endorse heavy smoking and alcohol use. Currently does live with other family members.  INTERVAL HISTORY:  Elizabeth Mason is a wonderful 58 y.o. 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  Patient was last seen by me on 06/25/2023 and reported kidney infection, arm fracture, and limited ROM of upper extremity.   She is accompanied by her daughter during today's visit.   She reports that she has been doing well overall since her last clinical visit.   Patient started Kisqali  400 MG and Letrozole  2.5 MG. She has been on the combination pills for nearly 1 month. Patient reports intermittent hot flashes.   She notes nausea the day before the last. She denies any concern for vomiting.   She is taking vitamin D  regularly. Patient notes regular diary in diet.   She reports mild pus-like discharge from her wound. Patient notes some pain when rubbing the area of her wound.   She denies any fever or chills. Patient complains of sweats in the mornings sometimes.  Patient is noted to have a dental issue under tongue and roof of mouth and is requiring dental surgery.   She reports that she is well-hydrated. Patient reports that she is eating well overall.   She reports having a skin lesion in the back near the bra line, a little bit before her hospitalization She reports  that the lesion is itchy. Patient denies any new skin lesions. Upon physical exam, she is noted to have skin mets from her tumor to the head, chest wall, around the axilla, and front of the abdomen.   She denies any leg swelling or abdominal pain.    Her daughter notes that pt has a new Prozac  prescription, managed by Dr. Perri.   She reports that she is eating well. Her current weight is 90 pounds. Patient reports that her goal weight is 134 pounds, though her daughter notes never  Patient plans to travel near the [place] time by cruise.   Pt does not have a PCP at this time.   She is currently smoking 3 cigarettes daily. Pt notes that she has cut back on alcohol. She currently drinks 1 beer daily sometimes.   MEDICAL HISTORY:   Nicotine  abuse smoker 2 packs/day with more than 75-pack-year history of smoking Heavy alcohol use Depression Poor medical follow-up  SURGICAL HISTORY: Past Surgical History:  Procedure Laterality Date   ABDOMINAL HYSTERECTOMY      SOCIAL HISTORY: Social History   Socioeconomic History   Marital status: Single    Spouse name: Not on file   Number of children: Not on file   Years of education: Not on file   Highest education level: Not on file  Occupational History   Not on file  Tobacco Use   Smoking status: Every Day    Current packs/day: 2.00    Average packs/day: 2.0 packs/day for 38.5 years (77.0 ttl pk-yrs)    Types: Cigarettes    Start date: 18   Smokeless tobacco: Never  Vaping Use   Vaping status: Never Used  Substance and Sexual Activity   Alcohol use: Yes    Alcohol/week: 21.0 standard drinks of alcohol    Types: 21 Cans of beer per week    Comment: occ beer   Drug use: Not Currently    Frequency: 3.0 times per week    Types: Marijuana   Sexual activity: Yes    Birth control/protection: None  Other Topics Concern   Not on file  Social History Narrative   Not on file   Social Drivers of Health   Financial Resource  Strain: Not on file  Food Insecurity: No Food Insecurity (06/13/2023)   Hunger Vital Sign    Worried About Running Out of Food in the Last Year: Never true    Ran Out of Food in the Last Year: Never true  Transportation Needs: No Transportation Needs (06/13/2023)   PRAPARE - Administrator, Civil Service (Medical): No    Lack of Transportation (Non-Medical): No  Physical Activity: Not on file  Stress: Not on file  Social Connections: Not on file  Intimate Partner Violence: Not At Risk (06/13/2023)   Humiliation, Afraid, Rape, and Kick questionnaire    Fear of Current or Ex-Partner: No    Emotionally Abused: No    Physically Abused: No    Sexually Abused: No    FAMILY HISTORY: No family history on file. Notes family history of hypertension diabetes dyslipidemia. Does not report any family history of cancers.  ALLERGIES:  has no known allergies.  MEDICATIONS:  Current Outpatient Medications  Medication Sig Dispense Refill   diphenhydrAMINE  (BENADRYL  ALLERGY) 25 MG tablet Take 1 tablet (25 mg total) by mouth every 6 (six) hours as needed for itching. Caution with drowsiness. 30 tablet 0   FLUoxetine  (PROZAC ) 10 MG capsule Take 1 capsule (10 mg total) by mouth daily. 30 capsule 1   letrozole  (FEMARA ) 2.5 MG tablet Take 1 tablet (2.5 mg total) by mouth daily. 30 tablet 11   loratadine  (CLARITIN ) 10 MG tablet Take 1 tablet (10 mg total) by mouth daily. 30 tablet 1   ribociclib  succ (KISQALI  400MG  DAILY DOSE) 200 MG Therapy Pack Take 2 tablets (400 mg total) by mouth daily. Take for 21 days on, 7 days off, repeat every 28 days. 42 tablet 1   traZODone  (DESYREL ) 50 MG tablet Take 1 tablet (50 mg total) by mouth at bedtime as needed for sleep. 30 tablet 1   Vitamin D , Ergocalciferol , (DRISDOL ) 1.25 MG (50000 UNIT) CAPS capsule Take 1 capsule (50,000 Units total) by mouth every 7 (seven) days for 7 doses. 7 capsule 0   No current facility-administered medications for this visit.     REVIEW OF SYSTEMS:    10 Point review of Systems was done is negative except as noted above.  PHYSICAL EXAMINATION: ECOG PERFORMANCE STATUS: 2 - Symptomatic, <50% confined to bed  . Vitals:   07/22/23 0850  BP: (!) 148/91  Pulse: 93  Resp: 18  Temp: (!) 97.3 F (36.3 C)  SpO2: 98%    Filed Weights   07/22/23 0850  Weight: 90 lb 4.8 oz (41 kg)   .Body mass index is 16.52 kg/m.  GENERAL:alert, in no acute distress and comfortable SKIN: no acute rashes, no significant lesions EYES: conjunctiva are pink and non-injected, sclera anicteric OROPHARYNX: MMM, no exudates, no oropharyngeal erythema or ulceration NECK: supple, no JVD LYMPH:  no palpable lymphadenopathy in the cervical, axillary or inguinal regions LUNGS: clear to auscultation b/l with normal respiratory effort HEART: regular rate & rhythm ABDOMEN:  normoactive bowel sounds , non tender, not distended. Extremity: no pedal edema PSYCH: alert & oriented x 3 with fluent speech NEURO: no focal motor/sensory deficits   LABORATORY DATA:  I have reviewed the data as listed  .    Latest Ref Rng & Units 07/22/2023    8:40 AM 06/25/2023   10:03 AM 06/18/2023    4:22 AM  CBC  WBC 4.0 - 10.5 K/uL 4.1  7.7  11.4   Hemoglobin 12.0 - 15.0 g/dL 88.3  87.0  89.2   Hematocrit 36.0 - 46.0 % 34.3  37.8  32.2   Platelets 150 - 400 K/uL 452  809  361     .    Latest Ref Rng & Units 07/22/2023    8:40 AM 06/25/2023   10:03 AM 06/18/2023    4:22 AM  CMP  Glucose 70 - 99 mg/dL 893  888  883   BUN 6 - 20 mg/dL 10  17  14    Creatinine 0.44 - 1.00 mg/dL 8.82  8.75  9.03   Sodium 135 - 145 mmol/L 137  134  134   Potassium 3.5 - 5.1 mmol/L 3.8  4.2  4.2   Chloride 98 - 111 mmol/L 100  100  100   CO2 22 - 32 mmol/L 28  24  23    Calcium 8.9 - 10.3 mg/dL 9.9  89.8  8.8   Total Protein  6.5 - 8.1 g/dL 8.7  9.5  7.1   Total Bilirubin 0.0 - 1.2 mg/dL 0.3  0.3  0.4   Alkaline Phos 38 - 126 U/L 148  130  104   AST 15 - 41 U/L 11  16   11    ALT 0 - 44 U/L 10  12  10     06/14/2023 Cancer Antigen 27.29:   06/14/2023 Cancer Antigen 15-3:    RADIOGRAPHIC STUDIES: I have personally reviewed the radiological images as listed and agreed with the findings in the report. No results found.  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  Left upper breast mass with skin changes and left axillary lymphadenopathy.  Also noted to have diffuse sclerotic bone metastases.   #2 left-sided pyelonephritis with left-sided hydronephrosis and hydroureter.    #3 hx sepsis due to pyelonephritis.   #4 heavy smoker more than 75-pack-year history of cigarette smoking   #5 history of heavy alcohol use 6-10 beers daily.   PLAN  -Discussed lab results from 06/25/2023 in detail with patient. CBC showed WBC of 7.7K, hemoglobin of 12.9, and platelets of 809K. -CBC stable -WBC normal -discussed that Kisqali  can cause WBC to be somewhat low -HGB 11 - minimal anemia not concerning -PLT normal -CMP shows elevated alkaline phosphatase at 148. Discussed that she was noted to have high alkaline phosphatase even before her hospitalization, and it should hopefully normalize.  -kidney and liver function stable -potassium 3.8 -EKG shows no concerns of Qtc prolongation -vitamin D  improved to 66.99 ng/mL -Estradiol  50.9 pg/mL -LH 43.7 mIU/mL -FSH 80.5 -hormonal profile shows she is post-menopausal and no changes in treatment plan in this regard.  -discussed goal for tumor markers CA 15-3 and CA 27.29 to go down as much as possible -patient is noted to have skin mets from her tumor to the head, chest wall, around the axilla, and front of the abdomen -discussed that her condition is not curable, but  hopefully will be controlled for long periods of time -proceed with mammogram today to evaluate the local tumor in the area -bone density not yet scheduled - she shall proceed with her bone density scan to ensure there is no concern for osteoporosis -discussed that if there is significant osteoporosis on her dexa scan, we would address this potentially with shot in skin to strengthen the bones -recommend 1000-1500 MG calcium in diet -patient has tolerated Kisqali  and Letrozole  well with no major toxicity issues -continue current dose of  Kisqali  400 MG and Letrozole  2.5 MG for a couple of months, then we will consider full dose down the line -there is no role for surgery at this time -discussed that in a metastatic setting, if all  other areas of tumor and primary tumor shrinks significantly to the point that there may be a role for surgery to control the local tumor, and her surgeon feels that surgery is possible down the line, to get to the point of no visible tumor, surgery may be a consideration down the line -recommend minimizing alcohol use and smoking -discussed that alcohol is a risk factor from the standpoint of electrolyte abnormalities -recommend to optimize electrolytes in the diet  -avoid grapefruit due to affecting how medication is cleared at the level of the  liver -to improve her hot flashes, I would recommend she drinks at least 2L of water daily, staying regularly active, and minimizing caffeine use. If her hot flashes are persistent, I would recommend vitamin E 400 units. There would also be the option of antidepressents if needed.  -okay for patient to proceed with dental surgery. -until her dental procedure is complete, we will hold off on Zometa -discussed that typically, she would need to be off of Zometa for 1-2 months before and after her dental procedure -Discussed that we will not dose escalate her Kisqali  around the time of her dental surgery to reduce the risk of  infections -discussed that there is a need for antibiotics around the time of her dental procedure -her wound does not appear to be infected at this time based on physical exam -will plan to scan her every 6-12 months -will place referral to transition clinic to help pt establish care with PCP -recommend patient to ensure she is UTD with her age-appropriate vaccines with PCP prior to her upcoming cruise trip, including Flu vaccine, COVID-19 vaccine, and pneumonia -recommend staying regularly active -recommend using a topical antibiotic ointment, such as ointment Neosporin for her wound to decrease colonization of bacteria in the skin around the area. Okay to use Vaseline as a barrier as well. It would not be unreasonable to apply a small sterile gauze to the area of her wound -answered all of patient's and her daughter's questions in detail -she shall return to clinic in a couple of months  FOLLOW-UP: Referral to community health and wellness to establish PCP RTC with Dr Onesimo with labs in 2 months  The total time spent in the appointment was 30 minutes* .  All of the patient's questions were answered with apparent satisfaction. The patient knows to call the clinic with any problems, questions or concerns.   Emaline Onesimo MD MS AAHIVMS [facility] Hematology/Oncology Physician [facility]  .*Total Encounter Time as defined by the Centers for Medicare and Medicaid Services includes, in addition to the face-to-face time of a patient visit (documented in the note above) non-face-to-face time: obtaining and reviewing outside history, ordering and reviewing medications, tests or procedures, care coordination (communications with other health care professionals or caregivers) and documentation in the medical record.    I,Mitra Faeizi,acting as a Neurosurgeon for Emaline Onesimo, MD.,have documented all relevant documentation on the behalf of Emaline Onesimo, MD,as directed by  Emaline Onesimo, MD while in  the presence of Emaline Onesimo, MD.  .I have reviewed the above documentation for accuracy and completeness, and I agree with the above. .Simone Tuckey Kishore D'Arcy Abraha MD

## 2023-07-23 ENCOUNTER — Other Ambulatory Visit: Payer: Self-pay

## 2023-07-27 ENCOUNTER — Other Ambulatory Visit: Payer: Self-pay

## 2023-07-27 ENCOUNTER — Other Ambulatory Visit: Payer: Self-pay | Admitting: Pharmacy Technician

## 2023-07-27 NOTE — Progress Notes (Signed)
 Specialty Pharmacy Ongoing Clinical Assessment Note  Elizabeth Mason is a 58 y.o. female who is being followed by the specialty pharmacy service for RxSp Oncology   Patient's specialty medication(s) reviewed today: Ribociclib  Succinate (KISQALI  400mg  Daily Dose)   Missed doses in the last 4 weeks: 0   Patient/Caregiver did not have any additional questions or concerns.   Therapeutic benefit summary: Patient is NOT achieving benefit (patient's daughter reports disease progression, Dr. Kale is reviewing treatment options)   Adverse events/side effects summary: No adverse events/side effects   Patient's therapy is appropriate to: Continue    Goals Addressed             This Visit's Progress    Maintain optimal adherence to therapy   On track    Patient is initiating therapy. Patient will maintain adherence         Follow up: 3 months  Silvano LOISE Dolly Specialty Pharmacist

## 2023-07-27 NOTE — Progress Notes (Signed)
 Specialty Pharmacy Refill Coordination Note  Elizabeth Mason is a 59 y.o. female contacted today regarding refills of specialty medication(s) Ribociclib  Succinate (KISQALI  400mg  Daily Dose)   Patient requested Pickup at [facility] date: 07/29/23   Medication will be filled on 07/28/23.

## 2023-07-28 ENCOUNTER — Other Ambulatory Visit: Payer: Self-pay

## 2023-08-18 ENCOUNTER — Encounter: Payer: Self-pay | Admitting: Student in an Organized Health Care Education/Training Program

## 2023-08-18 ENCOUNTER — Other Ambulatory Visit: Payer: Self-pay

## 2023-08-18 ENCOUNTER — Ambulatory Visit (INDEPENDENT_AMBULATORY_CARE_PROVIDER_SITE_OTHER): Payer: MEDICAID | Admitting: Student in an Organized Health Care Education/Training Program

## 2023-08-18 VITALS — BP 156/87 | HR 86 | Ht 62.0 in | Wt 93.0 lb

## 2023-08-18 DIAGNOSIS — E440 Moderate protein-calorie malnutrition: Secondary | ICD-10-CM

## 2023-08-18 DIAGNOSIS — F1090 Alcohol use, unspecified, uncomplicated: Secondary | ICD-10-CM

## 2023-08-18 DIAGNOSIS — F332 Major depressive disorder, recurrent severe without psychotic features: Secondary | ICD-10-CM | POA: Diagnosis not present

## 2023-08-18 DIAGNOSIS — C50919 Malignant neoplasm of unspecified site of unspecified female breast: Secondary | ICD-10-CM | POA: Diagnosis not present

## 2023-08-18 DIAGNOSIS — N1330 Unspecified hydronephrosis: Secondary | ICD-10-CM

## 2023-08-18 DIAGNOSIS — C7951 Secondary malignant neoplasm of bone: Secondary | ICD-10-CM

## 2023-08-18 DIAGNOSIS — I10 Essential (primary) hypertension: Secondary | ICD-10-CM

## 2023-08-18 MED ORDER — FLUOXETINE HCL 20 MG PO CAPS: 20.0000 mg | ORAL_CAPSULE | Freq: Every day | ORAL | 1 refills | Status: DC

## 2023-08-18 MED ORDER — NALTREXONE HCL 50 MG PO TABS: 50.0000 mg | ORAL_TABLET | Freq: Every day | ORAL | 1 refills | Status: DC

## 2023-08-18 NOTE — Progress Notes (Signed)
 Clinical Intervention Note  Clinical Intervention Notes: Patient daughter called stating mom was feeling nauseous this morning and did not get to take her dose of Kisqali  because of it. She has had intermittent nausea but it is not occurring daily. She states she is feeling better and asked if she can take the dose around lunch time. She usually takes it about 8am so advised that she can go ahead and take today's dose around lunch. Counseled as well that if she feels it is better to change the time of day that she takes her dose it is acceptable as long as she remains consistent overall. Daughter is in agreement.   Clinical Intervention Outcomes: Prevention of an adverse drug event   Delon Elizabeth Mason Specialty Pharmacist

## 2023-08-18 NOTE — Assessment & Plan Note (Signed)
 Managed by Dr. Onesimo with oncology.  She has hormone receptor positive, HER2 negative metastatic breast cancer.  She is on treatment with hormone therapy with Kisqali  and Femara .  This is first-line therapy for this type of breast cancer.  She is having some mild symptoms of precipitated menopause, but no toxicity.  Not on chemotherapy.  She has diffuse metastases in her bones and cutaneously.  Not much discomfort or pain at this point.

## 2023-08-18 NOTE — Assessment & Plan Note (Signed)
 Structural issue of left sided hydronephrosis associated with pyelonephritis episode in the hospital.  There were no calculi or other masses obstructing the ureter.  Renal function has improved and stabilized.  She is having no discomfort in the area.  It seems likely that this is going to resolve now that the pyelonephritis is improved.  Will consider repeat imaging in the future.

## 2023-08-18 NOTE — Assessment & Plan Note (Signed)
 Chronic issue.  Weight today 93 pounds with a BMI of 17.  Weight is slowly increasing.  Etiology is likely multifactorial from alcohol use disorder, depression, and metastatic breast cancer.  Hopefully we will see some improvement as we treat the depression and alcohol use disorder with medications.  Breast cancer is also being treated with hormone receptor therapy.  Will continue to watch her weights.  She has good support from her family and good access to food.  No current structural issues with eating.

## 2023-08-18 NOTE — Patient Instructions (Signed)
  VISIT SUMMARY: Today, you came in for a primary care visit and medication review. You are currently undergoing treatment for breast cancer and experiencing some side effects like hot flashes and occasional morning sickness. We also discussed your mood, alcohol use, blood pressure, and dental health.  YOUR PLAN: -METASTATIC BREAST CANCER WITH BONE INVOLVEMENT: You are continuing your Kisqali  treatment, which is working to block estrogen effectively, as shown by your hot flashes. Please keep taking Kisqali  as prescribed and report any side effects to your oncology team.  -MENOPAUSAL SYMPTOMS SECONDARY TO CANCER THERAPY: The hot flashes you are experiencing are due to the estrogen-blocking effects of your cancer treatment. This is a common side effect.  -MAJOR DEPRESSIVE DISORDER, SINGLE EPISODE: Your mood has been improving with fluoxetine . We are increasing your dose to 20 mg daily to help further improve your mood and sleep.  -ALCOHOL USE DISORDER: You occasionally drink up to two beers. We discussed using naltrexone  to help reduce cravings, but you prefer not to use it due to potential interactions with your other medications. We will prescribe naltrexone  to help manage your alcohol cravings.  -ESSENTIAL HYPERTENSION: Your blood pressure is a bit high at 152/87. Given your current health status, we will wait to start any new blood pressure medication. We will monitor your blood pressure and reassess in one month.  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 We will coordinate with an oral surgeon for evaluation and potential surgery. Please check the Medicaid website for dental providers who accept Medicaid.  INSTRUCTIONS: Please continue your current medications and follow up with your oncology team for any side effects from Kisqali . We will reassess your blood pressure in one month. Coordinate with an oral surgeon for  your dental surgery and check the Medicaid website for providers. If you have any new symptoms or concerns, please contact our office.

## 2023-08-18 NOTE — Progress Notes (Signed)
 New Patient Office Visit  Subjective    Patient ID: Elizabeth Mason, female    DOB: 1965-11-04  Age: 58 y.o. MRN: [id number]  CC:  Chief Complaint  Patient presents with   Establish Care    Was refferred by cancer center due to stage 4 breast cancer, does need a refill of prozac      HPI  Discussed the use of AI scribe software for clinical note transcription with the patient, who gave verbal consent to proceed.  History of Present Illness Elizabeth Mason is a 58 year old female with breast cancer who presents for primary care management and medication review. She is accompanied by her daughter, who is her primary caregiver and power of attorney.  She is currently undergoing a 21-day cycle of cancer medication followed by a 7-day break for breast cancer treatment. She experiences hot flashes, which she attributes to the medication, and occasional morning sickness. No significant side effects such as vomiting or nausea are reported.  She has a history of a kidney infection that led to a hospital admission where a CT scan incidentally revealed breast cancer. During the hospital stay, she was treated with antibiotics for pyelonephritis and hydronephrosis. No current urinary symptoms such as burning, fever, or chills are present.  She is taking fluoxetine  once daily for mood stabilization, which has been beneficial. She also takes Claritin  nightly and has medications like oxycodone , Benadryl , and trazodone  available as needed, though they are rarely used.  Her social history includes living with her partner and son in [place]. She is not currently working and is in the process of obtaining disability benefits. She reports drinking up to two beers occasionally, with no withdrawal symptoms when not drinking.  She has a past medical history of a minor stroke approximately four years ago, which occurred while drinking beer in the sun. She has not had any falls recently and remains active at home,  engaging in activities like yard work.  Her daughter is actively involved in her care, ensuring she attends appointments and manages her medications.    Outpatient Encounter Medications as of 08/18/2023  Medication Sig   loratadine  (CLARITIN ) 10 MG tablet Take 1 tablet (10 mg total) by mouth daily.   naltrexone  (DEPADE) 50 MG tablet Take 1 tablet (50 mg total) by mouth daily.   ribociclib  succ (KISQALI  400MG  DAILY DOSE) 200 MG Therapy Pack Take 2 tablets (400 mg total) by mouth daily. Take for 21 days on, 7 days off, repeat every 28 days.   traZODone  (DESYREL ) 50 MG tablet Take 1 tablet (50 mg total) by mouth at bedtime as needed for sleep.   [DISCONTINUED] diphenhydrAMINE  (BENADRYL  ALLERGY) 25 MG tablet Take 1 tablet (25 mg total) by mouth every 6 (six) hours as needed for itching. Caution with drowsiness.   [DISCONTINUED] FLUoxetine  (PROZAC ) 10 MG capsule Take 1 capsule (10 mg total) by mouth daily.   FLUoxetine  (PROZAC ) 20 MG capsule Take 1 capsule (20 mg total) by mouth daily.   letrozole  (FEMARA ) 2.5 MG tablet Take 1 tablet (2.5 mg total) by mouth daily.   Vitamin D , Ergocalciferol , (DRISDOL ) 1.25 MG (50000 UNIT) CAPS capsule Take 1 capsule (50,000 Units total) by mouth every 7 (seven) days for 7 doses. (Patient not taking: Reported on 08/18/2023)   No facility-administered encounter medications on file as of 08/18/2023.    Past Medical History:  Diagnosis Date   Breast cancer [facility])     Past Surgical History:  Procedure Laterality Date  ABDOMINAL HYSTERECTOMY      History reviewed. No pertinent family history.      Objective    BP (!) 156/87   Pulse 86   Ht 5' 2 (1.575 m)   Wt 93 lb (42.2 kg)   SpO2 97%   BMI 17.01 kg/m   Physical Exam  Gen: Thin, malnourished appearing person Eyes: Normal Ears: Unable to see the tympanic membrane due to nonobstructing cerumen in both ears Mouth: Poor dentition, multiple dental caries and missing teeth, bony overgrowth on the  maxillary palate Neck: Normal thyroid Heart: Regular, no murmur Lungs: Unlabored, clear throughout Abd: Soft, nontender, no organomegaly Ext: Warm, no edema, normal joints Skin: Multiple cutaneous metastases on her skin including on her scalp and 1 on her abdomen Neuro: Alert, quiet but conversational, follows instructions, full strength upper and lower extremities, mildly delayed get up and go, a little unsteady getting to the table, otherwise normal gait and balance Psych: Appropriate mood and affect, she is quiet but answers questions directly, defers to her daughter frequently, not anxious or depressed appearing today     Assessment & Plan:    Problem List Items Addressed This Visit       High   Alcohol use disorder (Chronic)   Chronic issue of alcohol use disorder.  She has cut back on drinking significantly after the diagnosis of metastatic cancer.  I recommended that we start medication assisted treatment.  She is still struggling with cravings especially when others bring alcohol to the house.  We decided to start naltrexone  50 mg once daily.  No contraindications to this.  No issues with cirrhosis on exam.  I advised that she cannot use oxycodone  while using naltrexone .  She does not use opioids consistently, if cancer associated pain becomes an issue in the future we can always discontinue naltrexone  if we need to use chronic opioids.      Relevant Medications   naltrexone  (DEPADE) 50 MG tablet   MDD (major depressive disorder) - Primary (Chronic)   Chronic and improving.  Tolerating fluoxetine  10 mg very well with some improvement in her mood.  They report that her mood is much more stable.  Still having some issues with sleep.  We decided to increase the fluoxetine  to 20 mg daily.  Follow-up with me in 4 weeks.      Relevant Medications   FLUoxetine  (PROZAC ) 20 MG capsule   Breast cancer metastasized to bone (HCC) (Chronic)   Managed by Dr. Onesimo with oncology.  She has  hormone receptor positive, HER2 negative metastatic breast cancer.  She is on treatment with hormone therapy with Kisqali  and Femara .  This is first-line therapy for this type of breast cancer.  She is having some mild symptoms of precipitated menopause, but no toxicity.  Not on chemotherapy.  She has diffuse metastases in her bones and cutaneously.  Not much discomfort or pain at this point.        Medium    Protein-calorie malnutrition (HCC) (Chronic)   Chronic issue.  Weight today 93 pounds with a BMI of 17.  Weight is slowly increasing.  Etiology is likely multifactorial from alcohol use disorder, depression, and metastatic breast cancer.  Hopefully we will see some improvement as we treat the depression and alcohol use disorder with medications.  Breast cancer is also being treated with hormone receptor therapy.  Will continue to watch her weights.  She has good support from her family and good access to food.  No  current structural issues with eating.      Hydronephrosis of left kidney (Chronic)   Structural issue of left sided hydronephrosis associated with pyelonephritis episode in the hospital.  There were no calculi or other masses obstructing the ureter.  Renal function has improved and stabilized.  She is having no discomfort in the area.  It seems likely that this is going to resolve now that the pyelonephritis is improved.  Will consider repeat imaging in the future.      Hypertension (Chronic)   Blood pressure elevated today at 156/87.  Asymptomatic and likely chronic.  Slightly elevated creatinine recently after an episode of pyelonephritis.  Given her comorbidities including metastatic cancer, starting hormone receptor therapy, malnutrition, I am not can start antihypertensives at this time.  May need to use them in the future.  Right now I think this blood pressure is not causing her a problem, will follow-up in 4 weeks for blood pressure recheck.       Return in about 4 weeks  (around 09/15/2023) for alcohol use management.   Cleatus Debby Specking, MD

## 2023-08-18 NOTE — Assessment & Plan Note (Signed)
 Chronic and improving.  Tolerating fluoxetine  10 mg very well with some improvement in her mood.  They report that her mood is much more stable.  Still having some issues with sleep.  We decided to increase the fluoxetine  to 20 mg daily.  Follow-up with me in 4 weeks.

## 2023-08-18 NOTE — Assessment & Plan Note (Signed)
 Blood pressure elevated today at 156/87.  Asymptomatic and likely chronic.  Slightly elevated creatinine recently after an episode of pyelonephritis.  Given her comorbidities including metastatic cancer, starting hormone receptor therapy, malnutrition, I am not can start antihypertensives at this time.  May need to use them in the future.  Right now I think this blood pressure is not causing her a problem, will follow-up in 4 weeks for blood pressure recheck.

## 2023-08-18 NOTE — Assessment & Plan Note (Signed)
 Chronic issue of alcohol use disorder.  She has cut back on drinking significantly after the diagnosis of metastatic cancer.  I recommended that we start medication assisted treatment.  She is still struggling with cravings especially when others bring alcohol to the house.  We decided to start naltrexone  50 mg once daily.  No contraindications to this.  No issues with cirrhosis on exam.  I advised that she cannot use oxycodone  while using naltrexone .  She does not use opioids consistently, if cancer associated pain becomes an issue in the future we can always discontinue naltrexone  if we need to use chronic opioids.

## 2023-08-20 ENCOUNTER — Other Ambulatory Visit: Payer: Self-pay | Admitting: Hematology

## 2023-08-20 ENCOUNTER — Other Ambulatory Visit: Payer: Self-pay

## 2023-08-20 MED ORDER — RIBOCICLIB SUCC (400 MG DOSE) 200 MG PO TBPK
400.0000 mg | ORAL_TABLET | Freq: Every day | ORAL | 1 refills | Status: AC
  Filled 2023-08-20: qty 42, 21d supply, fill #0
  Filled 2023-08-24: qty 42, 28d supply, fill #0
  Filled 2023-09-22 (×2): qty 42, 28d supply, fill #1

## 2023-08-24 ENCOUNTER — Other Ambulatory Visit (HOSPITAL_COMMUNITY): Payer: Self-pay

## 2023-08-24 ENCOUNTER — Other Ambulatory Visit: Payer: Self-pay

## 2023-08-24 NOTE — Progress Notes (Signed)
 Specialty Pharmacy Refill Coordination Note  [name] is a 58 y.o. female contacted today regarding refills of specialty medication(s) Ribociclib  Succinate (KISQALI  400mg  Daily Dose)   Patient requested Pickup at [facility] date: 08/25/23   Medication will be filled on 08/24/23.

## 2023-08-27 ENCOUNTER — Other Ambulatory Visit (HOSPITAL_COMMUNITY): Payer: Self-pay

## 2023-09-15 ENCOUNTER — Encounter: Payer: Self-pay | Admitting: Student in an Organized Health Care Education/Training Program

## 2023-09-15 ENCOUNTER — Other Ambulatory Visit: Payer: Self-pay | Admitting: Student in an Organized Health Care Education/Training Program

## 2023-09-15 ENCOUNTER — Other Ambulatory Visit (HOSPITAL_COMMUNITY): Payer: Self-pay

## 2023-09-15 ENCOUNTER — Ambulatory Visit (INDEPENDENT_AMBULATORY_CARE_PROVIDER_SITE_OTHER): Payer: MEDICAID | Admitting: Student in an Organized Health Care Education/Training Program

## 2023-09-15 VITALS — BP 168/129 | HR 75 | Wt 96.0 lb

## 2023-09-15 DIAGNOSIS — F332 Major depressive disorder, recurrent severe without psychotic features: Secondary | ICD-10-CM | POA: Diagnosis not present

## 2023-09-15 DIAGNOSIS — F1090 Alcohol use, unspecified, uncomplicated: Secondary | ICD-10-CM

## 2023-09-15 DIAGNOSIS — E440 Moderate protein-calorie malnutrition: Secondary | ICD-10-CM

## 2023-09-15 DIAGNOSIS — I10 Essential (primary) hypertension: Secondary | ICD-10-CM

## 2023-09-15 DIAGNOSIS — Z720 Tobacco use: Secondary | ICD-10-CM

## 2023-09-15 DIAGNOSIS — C50919 Malignant neoplasm of unspecified site of unspecified female breast: Secondary | ICD-10-CM

## 2023-09-15 DIAGNOSIS — C7951 Secondary malignant neoplasm of bone: Secondary | ICD-10-CM

## 2023-09-15 MED ORDER — LOSARTAN POTASSIUM-HCTZ 50-12.5 MG PO TABS: 1.0000 | ORAL_TABLET | Freq: Every day | ORAL | 1 refills | Status: AC

## 2023-09-15 NOTE — Assessment & Plan Note (Signed)
 Protein-calorie malnutrition has improved with a weight gain of three pounds. She is eating well with regular meals.  Weight today 96 pounds with a BMI of 17.5.  Seems to be improving nicely.

## 2023-09-15 NOTE — Assessment & Plan Note (Signed)
 Hypertension remains uncontrolled at 169/129 mmHg, though she is asymptomatic. Likely contributors include genetics, age, and tobacco use. Initiate antihypertensive medication, Hyzaar one pill daily. Monitor for lightheadedness upon standing, especially in the first few days. Instruct to stop medication and call if feeling like passing out. Follow up in one month to recheck blood pressure.

## 2023-09-15 NOTE — Assessment & Plan Note (Signed)
 Active metastatic malignancy is managed with ribociclib  and letrozole . Next oncology appointment is scheduled for September 3rd.

## 2023-09-15 NOTE — Assessment & Plan Note (Signed)
 Alcohol use disorder is well-managed. Cravings are controlled without naltrexone  due to visual disturbances.  I think it is okay to continue without naltrexone  because her cravings are doing well.

## 2023-09-15 NOTE — Patient Instructions (Signed)
  VISIT SUMMARY: During today's visit, we discussed your mental health, alcohol use, blood pressure, cancer treatment, nutrition, and smoking habits. You reported feeling more stable with your current medication for anxiety and depression, and your alcohol cravings are under control. We also addressed your high blood pressure and smoking habits, and reviewed your ongoing cancer treatment and nutrition.  YOUR PLAN: -DEPRESSION AND ANXIETY: Your depression and anxiety have significantly improved with your current treatment plan, including the adjusted dosage of fluoxetine . Continue taking your medication as prescribed and monitor your symptoms.  -ALCOHOL USE DISORDER: Your alcohol use disorder is well-managed, and you are not experiencing significant cravings. You have chosen not to use naltrexone  due to visual disturbances. Continue to monitor your alcohol intake and maintain your current healthy habits.  -ESSENTIAL HYPERTENSION: Your blood pressure is high at 169/129 mmHg, which can increase the risk of heart disease and stroke. We are starting you on an antihypertensive medication, to be taken once daily. Monitor for any lightheadedness, especially when standing up, and stop the medication and call us  if you feel like you might pass out. We will recheck your blood pressure in one month.  -ACTIVE MALIGNANCY ON CANCER THERAPY: Your cancer is being managed with ribociclib  and letrozole . Continue taking your cancer medication as prescribed and attend your next oncology appointment on September 3rd.  -PROTEIN-CALORIE MALNUTRITION: Your nutrition has improved, as evidenced by a weight gain of three pounds. Continue eating well with regular meals to maintain your progress.  -TOBACCO USE DISORDER: You continue to smoke, which contributes to your high blood pressure. We discussed smoking cessation strategies, including the potential use of Chantix. Consider these options to help you quit  smoking.  INSTRUCTIONS: Please follow up in one month to recheck your blood pressure. Continue taking your medications as prescribed and monitor for any side effects. Attend your oncology appointment on September 3rd. Consider smoking cessation strategies and maintain your healthy eating habits.

## 2023-09-15 NOTE — Assessment & Plan Note (Signed)
 Tobacco use disorder is ongoing. Considering Chantix for cessation as smoking contributes to hypertension. Discuss smoking cessation strategies, including potential use of Chantix.

## 2023-09-15 NOTE — Assessment & Plan Note (Signed)
 Depression and anxiety have significantly improved with current therapy, including an adjusted fluoxetine  dosage.

## 2023-09-15 NOTE — Progress Notes (Signed)
 Established Patient Office Visit  Subjective   Patient ID: Elizabeth Mason, female    DOB: 02-May-1965  Age: 58 y.o. MRN: [id number]  Chief Complaint  Patient presents with   Medical Management of Chronic Issues    4 week follow up     HPI  Discussed the use of AI scribe software for clinical note transcription with the patient, who gave verbal consent to proceed.  History of Present Illness Elizabeth Mason is a 58 year old female with anxiety, depression, and alcohol use disorder who presents for follow-up on her mental health and alcohol use.  She initially experienced significant side effects when starting Prozac , describing a period where she 'went crazy' and felt scared. She reports feeling more stable and that her anxiety and depression are a lot better. She occasionally feels overly active but is generally doing well.  Regarding alcohol use, she consumed one alcoholic beverage yesterday and primarily drinks water throughout the week. She was prescribed naltrexone  for alcohol cravings but only took it once due to visual disturbances, such as seeing spots. Despite not using naltrexone , she reports that her alcohol cravings do not bother her and she is not frequently thinking about alcohol.  She has gained three pounds since her last visit and reports eating well. She takes her cancer medication daily without issues and has not experienced nausea, vomiting, or discomfort around her left kidney. She has an upcoming appointment at the cancer center on September 3rd.  She continues to smoke and has been doing so for many years. She is physically active, walking to the store and riding motorcycles with her daughter. She feels strong enough to perform daily activities such as shopping and cleaning with the help of her brother and son.  Her blood pressure was noted to be high at previous visits and at the cancer center.     Objective:     BP (!) 168/129 (BP Location: Left Arm, Patient  Position: Sitting, Cuff Size: Normal)   Pulse 75   Wt 96 lb (43.5 kg)   SpO2 100%   BMI 17.56 kg/m    Physical Exam  Gen: Well-appearing woman, moderate frailty Neck: Normal thyroid, no nodules, 1.5 cm adenopathy at the right upper cervical chain Heart: Regular, no murmur Lungs: Unlabored, mild coarse crackles at the right side, smoker's cough with deep breaths Abd: Soft, nontender, no organomegaly, cutaneous mets noted in the left abdomen Ext: Warm, sarcopenia, no edema    Assessment & Plan:    Problem List Items Addressed This Visit       High   Alcohol use disorder (Chronic)   Alcohol use disorder is well-managed. Cravings are controlled without naltrexone  due to visual disturbances.  I think it is okay to continue without naltrexone  because her cravings are doing well.      MDD (major depressive disorder) (Chronic)   Depression and anxiety have significantly improved with current therapy, including an adjusted fluoxetine  dosage.       Breast cancer metastasized to bone [facility]) (Chronic)   Active metastatic malignancy is managed with ribociclib  and letrozole . Next oncology appointment is scheduled for September 3rd.        Medium    Tobacco use (Chronic)   Tobacco use disorder is ongoing. Considering Chantix for cessation as smoking contributes to hypertension. Discuss smoking cessation strategies, including potential use of Chantix.      Protein-calorie malnutrition (HCC) - Primary (Chronic)   Protein-calorie malnutrition has improved with a weight gain of  three pounds. She is eating well with regular meals.  Weight today 96 pounds with a BMI of 17.5.  Seems to be improving nicely.      Hypertension (Chronic)   Hypertension remains uncontrolled at 169/129 mmHg, though she is asymptomatic. Likely contributors include genetics, age, and tobacco use. Initiate antihypertensive medication, Hyzaar one pill daily. Monitor for lightheadedness upon standing, especially in the  first few days. Instruct to stop medication and call if feeling like passing out. Follow up in one month to recheck blood pressure.      Relevant Medications   losartan -hydrochlorothiazide (HYZAAR) 50-12.5 MG tablet    Return in about 4 weeks (around 10/13/2023).    Cleatus Debby Specking, MD

## 2023-09-16 ENCOUNTER — Other Ambulatory Visit: Payer: Self-pay

## 2023-09-17 ENCOUNTER — Other Ambulatory Visit (HOSPITAL_COMMUNITY): Payer: Self-pay

## 2023-09-18 ENCOUNTER — Other Ambulatory Visit: Payer: Self-pay

## 2023-09-22 ENCOUNTER — Other Ambulatory Visit: Payer: Self-pay

## 2023-09-22 ENCOUNTER — Other Ambulatory Visit (HOSPITAL_COMMUNITY): Payer: Self-pay

## 2023-09-22 DIAGNOSIS — Z170 Estrogen receptor positive status [ER+]: Secondary | ICD-10-CM

## 2023-09-22 NOTE — Progress Notes (Signed)
 Specialty Pharmacy Refill Coordination Note  Elizabeth Mason is a 58 y.o. female contacted today regarding refills of specialty medication(s) Ribociclib  Succinate (KISQALI  400mg  Daily Dose)   Patient requested Pickup at [facility] at [facility] date: 09/23/23   Medication will be filled on 09/22/23.

## 2023-09-23 ENCOUNTER — Other Ambulatory Visit: Payer: Self-pay

## 2023-09-23 ENCOUNTER — Inpatient Hospital Stay (HOSPITAL_BASED_OUTPATIENT_CLINIC_OR_DEPARTMENT_OTHER): Payer: MEDICAID | Admitting: Hematology

## 2023-09-23 ENCOUNTER — Inpatient Hospital Stay: Payer: MEDICAID | Attending: Hematology

## 2023-09-23 ENCOUNTER — Other Ambulatory Visit (HOSPITAL_COMMUNITY): Payer: Self-pay

## 2023-09-23 VITALS — BP 130/67 | HR 84 | Temp 97.7°F | Resp 18 | Ht 62.0 in | Wt 97.3 lb

## 2023-09-23 DIAGNOSIS — F1010 Alcohol abuse, uncomplicated: Secondary | ICD-10-CM | POA: Diagnosis not present

## 2023-09-23 DIAGNOSIS — C50412 Malignant neoplasm of upper-outer quadrant of left female breast: Secondary | ICD-10-CM

## 2023-09-23 DIAGNOSIS — Z170 Estrogen receptor positive status [ER+]: Secondary | ICD-10-CM | POA: Diagnosis not present

## 2023-09-23 DIAGNOSIS — Z79811 Long term (current) use of aromatase inhibitors: Secondary | ICD-10-CM | POA: Insufficient documentation

## 2023-09-23 DIAGNOSIS — Z681 Body mass index (BMI) 19 or less, adult: Secondary | ICD-10-CM | POA: Insufficient documentation

## 2023-09-23 DIAGNOSIS — F17210 Nicotine dependence, cigarettes, uncomplicated: Secondary | ICD-10-CM | POA: Insufficient documentation

## 2023-09-23 DIAGNOSIS — C7951 Secondary malignant neoplasm of bone: Secondary | ICD-10-CM | POA: Insufficient documentation

## 2023-09-23 DIAGNOSIS — Z79899 Other long term (current) drug therapy: Secondary | ICD-10-CM | POA: Diagnosis not present

## 2023-09-23 DIAGNOSIS — Z1721 Progesterone receptor positive status: Secondary | ICD-10-CM | POA: Insufficient documentation

## 2023-09-23 DIAGNOSIS — N136 Pyonephrosis: Secondary | ICD-10-CM | POA: Diagnosis not present

## 2023-09-23 DIAGNOSIS — Z1732 Human epidermal growth factor receptor 2 negative status: Secondary | ICD-10-CM | POA: Diagnosis not present

## 2023-09-23 DIAGNOSIS — R634 Abnormal weight loss: Secondary | ICD-10-CM | POA: Diagnosis not present

## 2023-09-23 LAB — CMP (CANCER CENTER ONLY)
ALT: 29 U/L (ref 0–44)
AST: 20 U/L (ref 15–41)
Albumin: 4.4 g/dL (ref 3.5–5.0)
Alkaline Phosphatase: 97 U/L (ref 38–126)
Anion gap: 8 (ref 5–15)
BUN: 24 mg/dL — ABNORMAL HIGH (ref 6–20)
CO2: 24 mmol/L (ref 22–32)
Calcium: 9.6 mg/dL (ref 8.9–10.3)
Chloride: 103 mmol/L (ref 98–111)
Creatinine: 0.97 mg/dL (ref 0.44–1.00)
GFR, Estimated: >60 mL/min (ref >60)
Glucose, Bld: 158 mg/dL — ABNORMAL HIGH (ref 70–99)
Potassium: 4.3 mmol/L (ref 3.5–5.1)
Sodium: 135 mmol/L (ref 135–145)
Total Bilirubin: 0.3 mg/dL (ref 0.0–1.2)
Total Protein: 8.3 g/dL — ABNORMAL HIGH (ref 6.5–8.1)

## 2023-09-23 LAB — CBC WITH DIFFERENTIAL (CANCER CENTER ONLY)
Abs Immature Granulocytes: 0.01 K/uL (ref 0.00–0.07)
Basophils Absolute: 0 K/uL (ref 0.0–0.1)
Basophils Relative: 1 %
Eosinophils Absolute: 0 K/uL (ref 0.0–0.5)
Eosinophils Relative: 0 %
HCT: 35.5 % — ABNORMAL LOW (ref 36.0–46.0)
Hemoglobin: 12.4 g/dL (ref 12.0–15.0)
Immature Granulocytes: 0 %
Lymphocytes Relative: 39 %
Lymphs Abs: 1.6 K/uL (ref 0.7–4.0)
MCH: 34.7 pg — ABNORMAL HIGH (ref 26.0–34.0)
MCHC: 34.9 g/dL (ref 30.0–36.0)
MCV: 99.4 fL (ref 80.0–100.0)
Monocytes Absolute: 0.4 K/uL (ref 0.1–1.0)
Monocytes Relative: 9 %
Neutro Abs: 2.1 K/uL (ref 1.7–7.7)
Neutrophils Relative %: 51 %
Platelet Count: 363 K/uL (ref 150–400)
RBC: 3.57 MIL/uL — ABNORMAL LOW (ref 3.87–5.11)
RDW: 19.3 % — ABNORMAL HIGH (ref 11.5–15.5)
WBC Count: 4.1 K/uL (ref 4.0–10.5)
nRBC: 0 % (ref 0.0–0.2)

## 2023-09-23 LAB — MAGNESIUM: Magnesium: 2.2 mg/dL (ref 1.7–2.4)

## 2023-09-23 MED ORDER — RIBOCICLIB SUCC (400 MG DOSE) 200 MG PO TBPK
400.0000 mg | ORAL_TABLET | Freq: Every day | ORAL | 1 refills | Status: DC
  Filled 2023-10-12: qty 42, 28d supply, fill #0
  Filled 2023-11-09: qty 42, 28d supply, fill #1

## 2023-09-23 MED ORDER — ERGOCALCIFEROL 1.25 MG (50000 UT) PO CAPS
50000.0000 [IU] | ORAL_CAPSULE | ORAL | 3 refills | Status: AC
  Filled 2023-09-23: qty 12, 84d supply, fill #0
  Filled 2023-12-15: qty 12, 84d supply, fill #1
  Filled 2024-02-23: qty 12, 84d supply, fill #2

## 2023-09-23 MED ORDER — LETROZOLE 2.5 MG PO TABS
2.5000 mg | ORAL_TABLET | Freq: Every day | ORAL | 11 refills | Status: AC
  Filled 2023-09-23: qty 30, 30d supply, fill #0
  Filled 2023-10-28: qty 30, 30d supply, fill #1
  Filled 2023-11-28: qty 28, 28d supply, fill #2
  Filled 2023-12-23: qty 28, 28d supply, fill #3
  Filled 2024-01-29: qty 28, 28d supply, fill #4
  Filled 2024-02-23: qty 28, 28d supply, fill #5

## 2023-09-24 ENCOUNTER — Telehealth: Payer: Self-pay | Admitting: Student in an Organized Health Care Education/Training Program

## 2023-09-24 ENCOUNTER — Other Ambulatory Visit: Payer: Self-pay

## 2023-09-24 ENCOUNTER — Other Ambulatory Visit (HOSPITAL_COMMUNITY): Payer: Self-pay

## 2023-09-24 DIAGNOSIS — F332 Major depressive disorder, recurrent severe without psychotic features: Secondary | ICD-10-CM

## 2023-09-24 MED ORDER — SERTRALINE HCL 50 MG PO TABS: 50.0000 mg | ORAL_TABLET | Freq: Every day | ORAL | 3 refills | Status: DC

## 2023-09-24 NOTE — Telephone Encounter (Signed)
 I was contacted by Dr. Onesimo that there is a QT prolongation interaction between Kisqali  and fluoxetine .  I talked with the patient's daughter by phone.  We decided to switch to sertraline  50 mg daily for management of her mild mood disorder.  This will have less QT prolonging interaction.  Will follow-up with the patient at her next usual visit, or sooner if there is any issues with her mood.  Overall patient is doing very well.

## 2023-09-29 ENCOUNTER — Other Ambulatory Visit: Payer: Self-pay | Admitting: Student in an Organized Health Care Education/Training Program

## 2023-09-29 DIAGNOSIS — F332 Major depressive disorder, recurrent severe without psychotic features: Secondary | ICD-10-CM

## 2023-09-29 NOTE — Telephone Encounter (Signed)
 Copied from CRM #[id number]. Topic: Clinical - Medication Refill >> Sep 29, 2023  4:19 PM Leah C wrote: Medication: sertraline  (ZOLOFT ) 50 MG tablet  Has the patient contacted their pharmacy? Yes, she went to pick it up and they said they didn't have the prescription for it. Patients prescription was initially sent to the wrong pharmacy.   This is the patient's preferred pharmacy:  [id number] [place], [place] - [phone] AT [facility] OF [facility] & [address] [place] [address] Phone: [phone] Fax: [phone]   Is this the correct pharmacy for this prescription? Yes  If no, delete pharmacy and type the correct one.   Has the prescription been filled recently? No, it was sent to the wrong pharmacy.   Is the patient out of the medication? Yes  Has the patient been seen for an appointment in the last year OR does the patient have an upcoming appointment? Yes  Can we respond through MyChart? Yes  Agent: Please be advised that Rx refills may take up to 3 business days. We ask that you follow-up with your pharmacy.

## 2023-09-30 NOTE — Progress Notes (Signed)
 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  HISTORY OF PRESENTING ILLNESS:   Elizabeth Mason is a wonderful 58 y.o. female who has been referred to us  by Dr Davia for evaluation and management of possible metastatic breast cancer with bone mets.   Patient has a history of smoking 1 to 2 packs/day with more than 75-pack-year history of smoking, heavy alcohol use drinks 6-8 beers daily, depression and poor overall medical follow-up. Patient presented on 06/09/2023 with shaking chills, fever abdominal pain nausea and vomiting. She was diagnosed with sepsis likely due to left renal pyelonephritis and has been started on antibiotics for that. Patient had a CT chest abdomen pelvis on 06/09/2023 to evaluate her abdominal pain and this enlarged edematous left kidney consistent with pyelonephritis without renal abscess.  She was also noted to have left-sided hydronephrosis and hydroureter without any overt calculi or obstructing masses identified.  Patient was also incidentally noted to have soft tissue density within the upper outer left breast contiguous with skin thickening and retraction in the left axilla.  She was also noted to have diffuse sclerotic lesions throughout the axial and appendicular skeleton.   Overall picture was noted to be concerning for breast cancer and therefore oncology consultation was requested. Patient has been scheduled for an ultrasound-guided biopsy of her left axillary mass on 06/16/2023.   Patient notes that she has had left upper breast thickening and progressive skin retraction over the last several months.  Even prior to that she had noted a left axillary mass for more than a year and thought  she had an ingrown hair in the axilla and infection which was intermittently draining.  She does not have a primary care physician and did not seek any help or evaluation for this.   She has had poor medical follow-up and does not recollect having had a mammogram in the last 5 years.  She is also not up to speed with her other age-appropriate cancer screening.   Patient notes that she might have lost about 15 to 20 pounds over the last year. Does not note any focal bone pains. Does endorse heavy smoking and alcohol use. Currently does live with other family members.  INTERVAL HISTORY:  Elizabeth Mason is a wonderful 58 y.o. 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  Patient is here for continued evaluation and management of her breast cancer.  She notes that she can move her shoulder better and that the tumor under her left armpit and the left breast have shrunk quite significantly. She is tolerating her Kisquali well at 400 mg p.o. daily.  She was not certain if she picked up her letrozole  and the new prescription was sent again so she can pick it up and use it with the Kisqali .  We discussed the importance of medication compliance. She notes that she has given up smoking and alcohol and feels much better.  Good p.o. intake.  Weight has gone up about 4 pounds in the last 4 weeks or so. She has been started on an SSRI by her PCP.  Discussed with PCP at her Paxil is likely going to be  changed to Zoloft  to limit QT prolongation and drug interactions with her Kisquali. No infection issues. Her skin metastases have flattened out.  MEDICAL HISTORY:   Nicotine  abuse smoker 2 packs/day with more than 75-pack-year history of smoking Heavy alcohol use Depression Poor medical follow-up  SURGICAL HISTORY: Past Surgical History:  Procedure  Laterality Date   ABDOMINAL HYSTERECTOMY      SOCIAL HISTORY: Social History   Socioeconomic History   Marital status: Single    Spouse name: Not on file   Number of children: Not on file   Years of education: Not on file   Highest education level: Not on file  Occupational History   Not on file  Tobacco Use   Smoking status: Every Day    Current packs/day: 2.00    Average packs/day: 2.0 packs/day for 38.7 years (77.4 ttl pk-yrs)    Types: Cigarettes    Start date: 79   Smokeless tobacco: Never  Vaping Use   Vaping status: Never Used  Substance and Sexual Activity   Alcohol use: Yes    Alcohol/week: 21.0 standard drinks of alcohol    Types: 21 Cans of beer per week    Comment: occ beer   Drug use: Not Currently    Frequency: 3.0 times per week    Types: Marijuana   Sexual activity: Yes    Birth control/protection: None  Other Topics Concern   Not on file  Social History Narrative   Not on file   Social Drivers of Health   Financial Resource Strain: Not on file  Food Insecurity: No Food Insecurity (06/13/2023)   Hunger Vital Sign    Worried About Running Out of Food in the Last Year: Never true    Ran Out of Food in the Last Year: Never true  Transportation Needs: No Transportation Needs (06/13/2023)   PRAPARE - Administrator, Civil Service (Medical): No    Lack of Transportation (Non-Medical): No  Physical Activity: Not on file  Stress: Not on file  Social Connections: Not on file  Intimate Partner Violence: Not At Risk (06/13/2023)   Humiliation, Afraid, Rape, and Kick questionnaire    Fear of Current or Ex-Partner: No    Emotionally Abused: No    Physically Abused: No    Sexually Abused: No    FAMILY HISTORY: No family history on file. Notes family history of hypertension diabetes dyslipidemia. Does not report any family history of cancers.  ALLERGIES:  has no known allergies.  MEDICATIONS:  Current Outpatient Medications  Medication  Sig Dispense Refill   ergocalciferol  (VITAMIN D2) 1.25 MG (50000 UT) capsule Take 1 capsule (50,000 Units total) by mouth once a week. 12 capsule 3   letrozole  (FEMARA ) 2.5 MG tablet Take 1 tablet (2.5 mg total) by mouth daily. 30 tablet 11   losartan -hydrochlorothiazide (HYZAAR) 50-12.5 MG tablet Take 1 tablet by mouth daily. 90 tablet 1   ribociclib  succ (KISQALI  400MG  DAILY DOSE) 200 MG Therapy Pack Take 2 tablets (400 mg total) by mouth daily. Take for 21 days on, 7 days off, repeat every 28 days. 42 tablet 1   ribociclib  succ (KISQALI  400MG  DAILY DOSE) 200 MG Therapy Pack Take 2 tablets (400 mg total) by mouth daily. Take for 21 days on, 7 days off, repeat every 28 days. 42 tablet 1   sertraline  (ZOLOFT ) 50 MG tablet Take 1 tablet (50 mg total) by mouth daily. 90 tablet 3   No current facility-administered medications for this visit.  REVIEW OF SYSTEMS:   .10 Point review of Systems was done is negative except as noted above.  PHYSICAL EXAMINATION: ECOG PERFORMANCE STATUS: 2 - Symptomatic, <50% confined to bed  . Vitals:   09/23/23 1039  BP: 130/67  Pulse: 84  Resp: 18  Temp: 97.7 F (36.5 C)  SpO2: 98%    Filed Weights   09/23/23 1039  Weight: 97 lb 4.8 oz (44.1 kg)   .Body mass index is 17.8 kg/m. SABRA GENERAL:alert, in no acute distress and comfortable SKIN: no acute rashes, no significant lesions EYES: conjunctiva are pink and non-injected, sclera anicteric OROPHARYNX: MMM, no exudates, no oropharyngeal erythema or ulceration NECK: supple, no JVD LYMPH:  no palpable lymphadenopathy in the cervical, axillary or inguinal regions LUNGS: clear to auscultation b/l with normal respiratory effort HEART: regular rate & rhythm ABDOMEN:  normoactive bowel sounds , non tender, not distended. Extremity: no pedal edema PSYCH: alert & oriented x 3 with fluent speech NEURO: no focal motor/sensory deficits   LABORATORY DATA:  I have reviewed the data as listed  .     Latest Ref Rng & Units 09/23/2023    9:42 AM 07/22/2023    8:40 AM 06/25/2023   10:03 AM  CBC  WBC 4.0 - 10.5 K/uL 4.1  4.1  7.7   Hemoglobin 12.0 - 15.0 g/dL 87.5  88.3  87.0   Hematocrit 36.0 - 46.0 % 35.5  34.3  37.8   Platelets 150 - 400 K/uL 363  452  809     .    Latest Ref Rng & Units 09/23/2023    9:42 AM 07/22/2023    8:40 AM 06/25/2023   10:03 AM  CMP  Glucose 70 - 99 mg/dL 841  893  888   BUN 6 - 20 mg/dL 24  10  17    Creatinine 0.44 - 1.00 mg/dL 9.02  8.82  8.75   Sodium 135 - 145 mmol/L 135  137  134   Potassium 3.5 - 5.1 mmol/L 4.3  3.8  4.2   Chloride 98 - 111 mmol/L 103  100  100   CO2 22 - 32 mmol/L 24  28  24    Calcium 8.9 - 10.3 mg/dL 9.6  9.9  89.8   Total Protein 6.5 - 8.1 g/dL 8.3  8.7  9.5   Total Bilirubin 0.0 - 1.2 mg/dL 0.3  0.3  0.3   Alkaline Phos 38 - 126 U/L 97  148  130   AST 15 - 41 U/L 20  11  16    ALT 0 - 44 U/L 29  10  12     06/14/2023 Cancer Antigen 27.29:   06/14/2023 Cancer Antigen 15-3:    RADIOGRAPHIC STUDIES: I have personally reviewed the radiological images as listed and agreed with the findings in the report. No results found.  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  Left upper breast mass with skin changes and left axillary lymphadenopathy.  Also noted to have diffuse sclerotic bone metastases.   #2 left-sided pyelonephritis with left-sided hydronephrosis and hydroureter.    #3 hx sepsis due to pyelonephritis.   #4 heavy smoker more than 75-pack-year history of cigarette smoking   #5 history of heavy alcohol use 6-10 beers daily.  PLAN - Patient notes good evidence of clinical response in terms of her left breast mass and mets in her left axilla. - Labs done today were  discussed in details with her and her daughter and show unremarkable CBC and CMP. Will recheck tumor markers again on next visit Recommended patient maintain compliance with her letrozole  as well as Kisquali at 400 mg p.o. daily -Her PCP will change her Paxil to sertraline  to try to reduce drug interactions that can cause QT prolongation. - She is in good spirits and eating better and has gained 4 pounds.  Was recommended to continue eating well to gain back her lost weight. - She has quit smoking and stopped drinking alcohol and was commended on this and recommended to continue this. - She is staying active and trying to walk more. - She has established care with an excellent primary care physician Dr. Jerrell. - Continue ergocalciferol  50k units weekly.  FOLLOW-UP: Return to clinic with Dr. Onesimo with labs in 2 months   .The total time spent in the appointment was 32 minutes* .  All of the patient's questions were answered with apparent satisfaction. The patient knows to call the clinic with any problems, questions or concerns.   Emaline Onesimo MD MS AAHIVMS [facility] Hematology/Oncology Physician [facility]  .*Total Encounter Time as defined by the Centers for Medicare and Medicaid Services includes, in addition to the face-to-face time of a patient visit (documented in the note above) non-face-to-face time: obtaining and reviewing outside history, ordering and reviewing medications, tests or procedures, care coordination (communications with other health care professionals or caregivers) and documentation in the medical record.

## 2023-10-06 ENCOUNTER — Other Ambulatory Visit: Payer: Self-pay

## 2023-10-06 DIAGNOSIS — F332 Major depressive disorder, recurrent severe without psychotic features: Secondary | ICD-10-CM

## 2023-10-06 MED ORDER — SERTRALINE HCL 50 MG PO TABS: 50.0000 mg | ORAL_TABLET | Freq: Every day | ORAL | 3 refills | Status: AC

## 2023-10-06 NOTE — Telephone Encounter (Unsigned)
 Copied from CRM [id number]. Topic: Clinical - Medication Question >> Oct 05, 2023  4:56 PM Chasity T wrote: Reason for CRM: [name] daughter of patient is stating that the medication sertraline  (ZOLOFT ) 50 MG tablet has been sent to the wrong pharmacy and needs it sent to the [facility] on [address] on file. Mother has not been on the medication because she hasn't been able to pick it up.

## 2023-10-12 ENCOUNTER — Other Ambulatory Visit: Payer: Self-pay

## 2023-10-12 ENCOUNTER — Other Ambulatory Visit (HOSPITAL_COMMUNITY): Payer: Self-pay

## 2023-10-12 NOTE — Progress Notes (Signed)
 Specialty Pharmacy Ongoing Clinical Assessment Note  Elizabeth Mason is a 58 y.o. female who is being followed by the specialty pharmacy service for RxSp Oncology   Patient's specialty medication(s) reviewed today: Ribociclib  Succinate (KISQALI  400mg  Daily Dose)   Missed doses in the last 4 weeks: 0   Patient/Caregiver asked additional questions regarding a dose calendar. Sent Katie Rph a secure chat that daughter was requesting another calendar for her mom. [name] will mail it to patient's home address.  Therapeutic benefit summary: Patient is achieving benefit   Adverse events/side effects summary: No adverse events/side effects   Patient's therapy is appropriate to: Continue    Goals Addressed             This Visit's Progress    Maintain optimal adherence to therapy   On track    Patient is on track. Patient will maintain adherence         Follow up: 3 months  [facility] Specialty Pharmacist

## 2023-10-12 NOTE — Progress Notes (Signed)
 Specialty Pharmacy Refill Coordination Note  Spoke with Scales,[name] (Daughter)  Elizabeth Mason is a 58 y.o. female contacted today regarding refills of specialty medication(s) Ribociclib  Succinate (KISQALI  400mg  Daily Dose)  Doses on hand: 8 days. Next cycle approx 10/27/23   Patient requested: [name] at [facility] date: 10/14/23  Medication will be filled on 10/13/23.

## 2023-10-13 ENCOUNTER — Telehealth: Payer: Self-pay

## 2023-10-13 ENCOUNTER — Other Ambulatory Visit: Payer: Self-pay

## 2023-10-13 NOTE — Telephone Encounter (Signed)
 Oncology Pharmacist Encounter  Received notification from specialty pharmacist that patient has requested more calendars for the Kisqali  as this has helped her keep up with the medication. Calendar has been mailed to patients address.   Bryn Perkin, PharmD Hematology/Oncology Clinical Pharmacist [facility] [phone]

## 2023-10-15 ENCOUNTER — Ambulatory Visit: Payer: MEDICAID | Admitting: Student in an Organized Health Care Education/Training Program

## 2023-10-19 ENCOUNTER — Encounter: Payer: Self-pay | Admitting: Student in an Organized Health Care Education/Training Program

## 2023-10-19 ENCOUNTER — Ambulatory Visit (INDEPENDENT_AMBULATORY_CARE_PROVIDER_SITE_OTHER): Payer: MEDICAID | Admitting: Student in an Organized Health Care Education/Training Program

## 2023-10-19 VITALS — BP 128/79 | HR 103 | Wt 98.2 lb

## 2023-10-19 DIAGNOSIS — F332 Major depressive disorder, recurrent severe without psychotic features: Secondary | ICD-10-CM

## 2023-10-19 DIAGNOSIS — E440 Moderate protein-calorie malnutrition: Secondary | ICD-10-CM | POA: Diagnosis not present

## 2023-10-19 DIAGNOSIS — F1090 Alcohol use, unspecified, uncomplicated: Secondary | ICD-10-CM | POA: Diagnosis not present

## 2023-10-19 DIAGNOSIS — I10 Essential (primary) hypertension: Secondary | ICD-10-CM | POA: Diagnosis not present

## 2023-10-19 NOTE — Assessment & Plan Note (Signed)
 Nutritional status has improved with a 2-pound weight gain, now at 98 pounds. She is eating well and gaining weight as desired. Continue current dietary intake to support weight gain and encourage eating three meals a day.

## 2023-10-19 NOTE — Assessment & Plan Note (Signed)
 She remains in remission with a decreased desire for alcohol and maintained abstinence. Continue to abstain from alcohol.

## 2023-10-19 NOTE — Assessment & Plan Note (Signed)
 Blood pressure is well-controlled at 128/79 mmHg with current medication. No side effects reported. Continue Losartan -hydrochlorothiazide 50-12.5 MG orally daily.  Labs were normal on 9/3, no need for labs today.

## 2023-10-19 NOTE — Progress Notes (Signed)
   Established Patient Office Visit  Subjective   Patient ID: Elizabeth Mason, female    DOB: 08-Apr-1965  Age: 58 y.o. MRN: [id number]  Chief Complaint  Patient presents with   Medical Management of Chronic Issues    4 week follow up     HPI  Discussed the use of AI scribe software for clinical note transcription with the patient, who gave verbal consent to proceed.  History of Present Illness Elizabeth Mason is a 58 year old female with hypertension who presents for a follow-up visit to monitor blood pressure control.  Her blood pressure has improved significantly, currently at 128/79 mmHg. She takes her medication daily without experiencing side effects such as lightheadedness, falls, or syncope. No new concerns are reported, and she states that everything is going well at home.  She has gained two pounds over the past month, now weighing 98 pounds, up from 96 pounds. She attributes this to eating well, as confirmed by her daughter who mentions that she is eating regularly. She has been approved for disability, which has provided financial support.  She is able to get out of the house daily and walks up and down the street for exercise. No lightheadedness, chest pain, or pressure during these activities, although she notes that her legs feel weak and tired, which she attributes to muscle loss.  Her current medications include Hyzaar, which she takes once daily, and sertraline , which she continues to take with good mood stability. She has reduced her alcohol intake significantly, stating 'I don't want it' and 'some days I want it out.'      Objective:     BP 128/79   Pulse (!) 103   Wt 98 lb 3.2 oz (44.5 kg)   SpO2 100%   BMI 17.96 kg/m   Physical Exam  Gen: Well-appearing woman Heart: Mildly tachycardic, no murmur Lungs: Unlabored, clear throughout Ext: Warm, no edema, normal joints    Assessment & Plan:    Problem List Items Addressed This Visit       High    Alcohol use disorder (Chronic)   She remains in remission with a decreased desire for alcohol and maintained abstinence. Continue to abstain from alcohol.      MDD (major depressive disorder) (Chronic)   Mood is stable and well-managed with current medication. No concerns reported. Continue Sertraline  as prescribed.        Medium    Protein-calorie malnutrition (Chronic)   Nutritional status has improved with a 2-pound weight gain, now at 98 pounds. She is eating well and gaining weight as desired. Continue current dietary intake to support weight gain and encourage eating three meals a day.      Hypertension - Primary (Chronic)   Blood pressure is well-controlled at 128/79 mmHg with current medication. No side effects reported. Continue Losartan -hydrochlorothiazide 50-12.5 MG orally daily.  Labs were normal on 9/3, no need for labs today.       Return in about 6 months (around 04/17/2024).    Cleatus Debby Specking, MD

## 2023-10-19 NOTE — Patient Instructions (Signed)
  VISIT SUMMARY: Today, we reviewed your blood pressure, nutritional status, mood, and alcohol use. Your blood pressure is well-controlled, and you have gained some weight, which is a positive sign. Your mood remains stable, and you continue to abstain from alcohol. We discussed continuing your current medications and lifestyle habits to maintain and improve your health.  YOUR PLAN: -PROTEIN-CALORIE MALNUTRITION: Protein-calorie malnutrition means your body is not getting enough nutrients from food. Your nutritional status has improved with a 2-pound weight gain. Continue eating well and aim for three meals a day to support further weight gain.  -HYPERTENSION: Hypertension is high blood pressure. Your blood pressure is well-controlled at 128/79 mmHg with your current medication. Continue taking Losartan -hydrochlorothiazide 50-12.5 MG once daily.  -DEPRESSION: Depression is a mood disorder that causes persistent feelings of sadness and loss of interest. Your mood is stable with your current medication. Continue taking Sertraline  as prescribed.  -ALCOHOL USE DISORDER, IN REMISSION: Alcohol use disorder is a condition where a person has difficulty controlling their alcohol consumption. You are in remission, meaning you have decreased your desire for alcohol and are maintaining abstinence. Continue to abstain from alcohol.  -GENERAL HEALTH MAINTENANCE: To improve your overall health and muscle strength, continue daily walking and getting out of the house for exercise. Physical therapy may be considered in the future if needed.  INSTRUCTIONS: Please continue with your current medications and lifestyle habits. If you experience any new symptoms or have concerns, schedule a follow-up appointment. Keep up with your daily walking and consider physical therapy if you feel it is needed in the future.

## 2023-10-19 NOTE — Assessment & Plan Note (Signed)
 Mood is stable and well-managed with current medication. No concerns reported. Continue Sertraline  as prescribed.

## 2023-10-28 ENCOUNTER — Other Ambulatory Visit: Payer: Self-pay

## 2023-11-09 ENCOUNTER — Other Ambulatory Visit (HOSPITAL_COMMUNITY): Payer: Self-pay

## 2023-11-09 ENCOUNTER — Other Ambulatory Visit: Payer: Self-pay

## 2023-11-09 NOTE — Progress Notes (Signed)
 Specialty Pharmacy Refill Coordination Note  Elizabeth Mason is a 58 y.o. female contacted today regarding refills of specialty medication(s) Ribociclib  Succinate (KISQALI  400mg  Daily Dose)   Patient requested Pickup at [facility] at [facility] date: 11/11/23   Medication will be filled on 11/10/23.

## 2023-11-16 IMAGING — CT CT HEAD W/O CM
4 series · 16 of 47 positions shown, 18 images · non-contrast
Comparison: None.

CLINICAL DATA: Mental status change of unknown cause.



[Series 3: head without · axial · non-contrast · 0.38mm/px · z∈[-51,+59]mm · 7 of 30 slices shown, 9 images]
[im 4/30  brain]
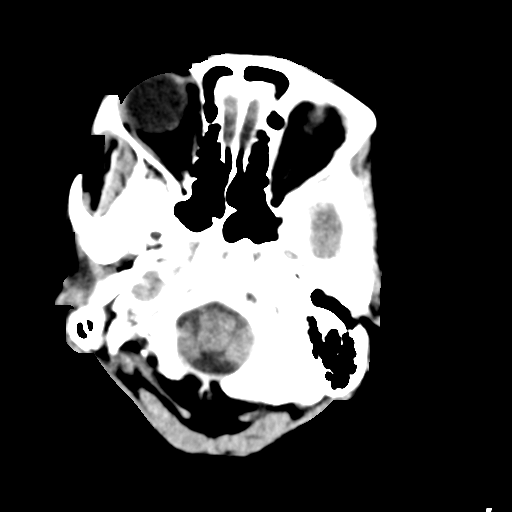
[im 4/30  bone]
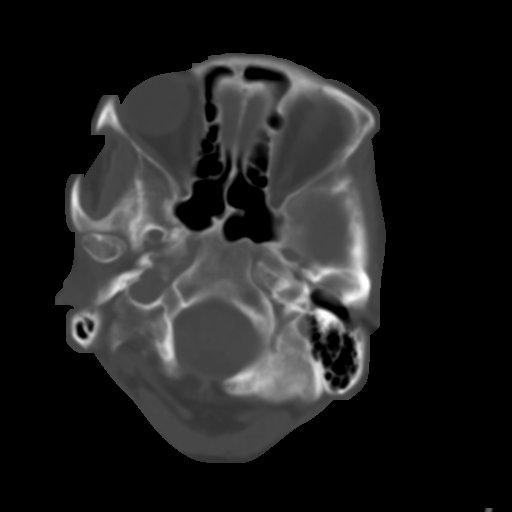
[im 8/30  brain]
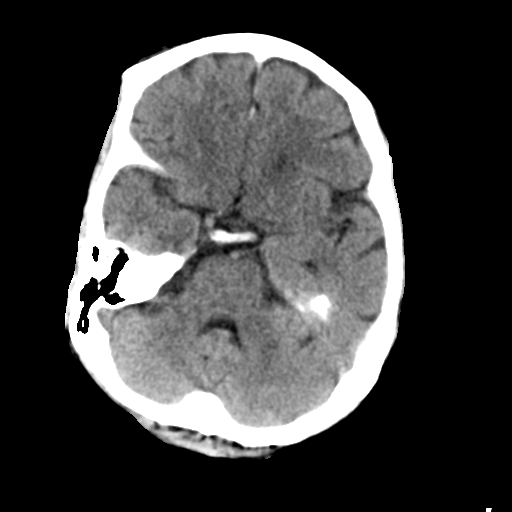
[im 11/30  brain]
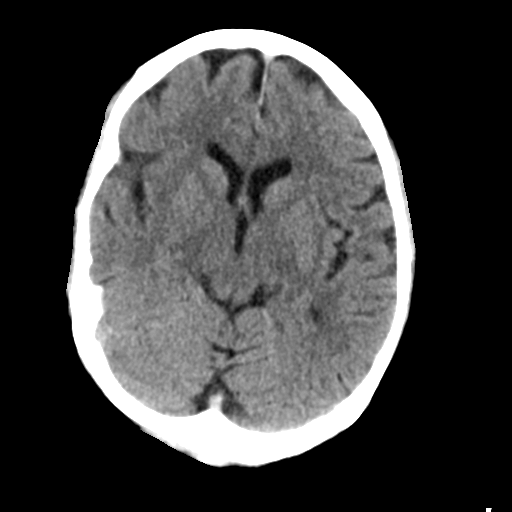
[im 15/30  brain]
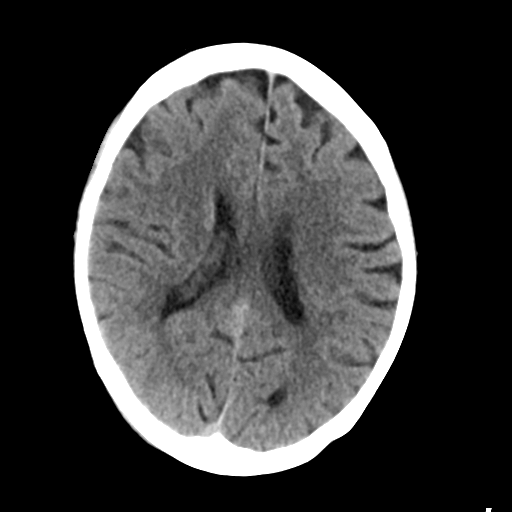
[im 19/30  brain]
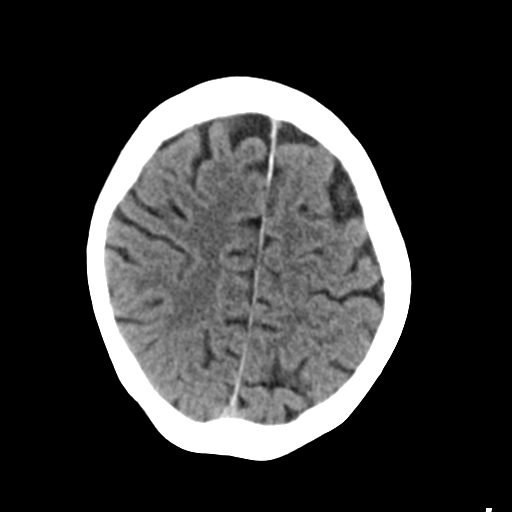
[im 19/30  bone]
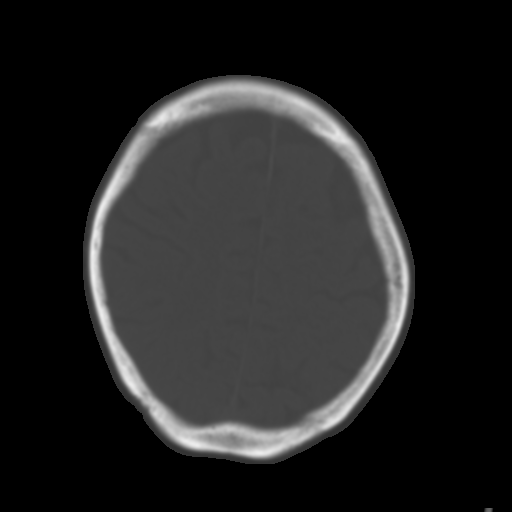
[im 22/30  brain]
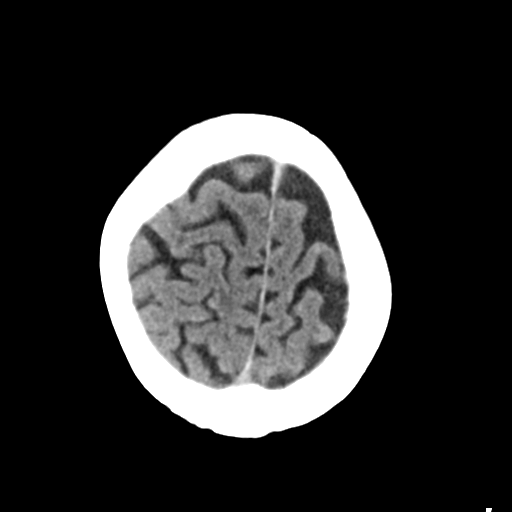
[im 26/30  brain]
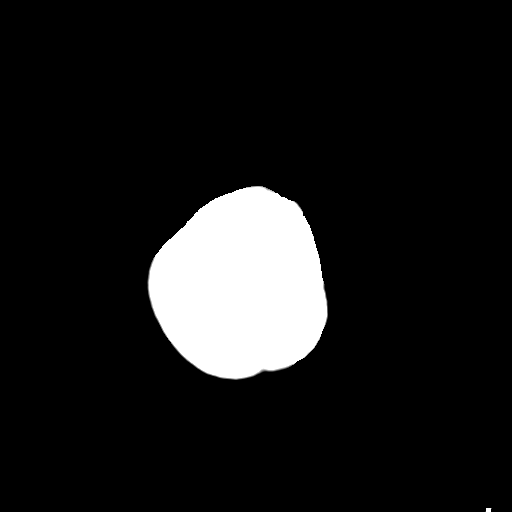

[Series 4: head bone · axial · 0.38mm/px · z∈[-52,-22]mm · 3 of 75 slices shown]
[im 8/75  bone]
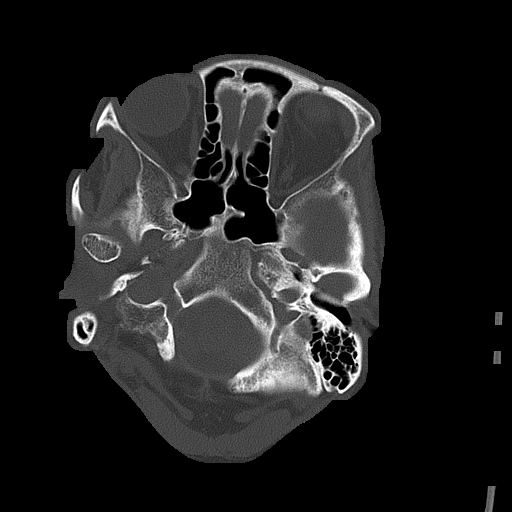
[im 15/75  bone]
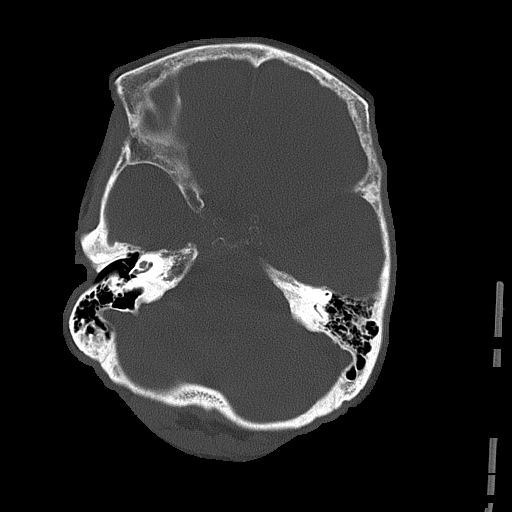
[im 23/75  bone]
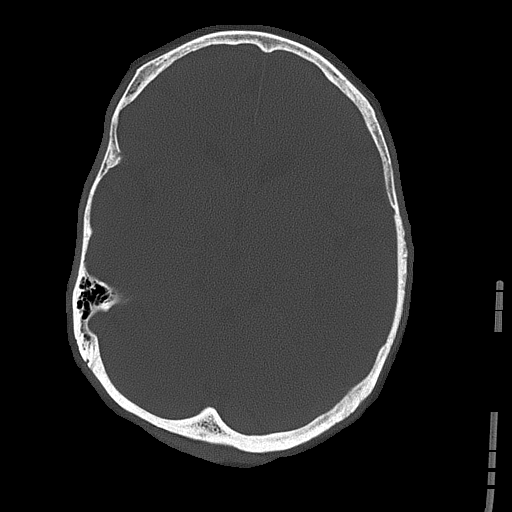

[Series 5: head without cor · coronal · non-contrast · 0.27mm/px · 3 of 67 slices shown]
[im 23/67  brain]
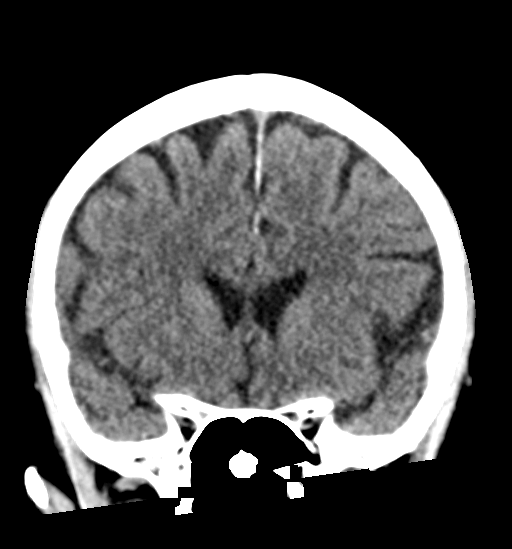
[im 30/67  brain]
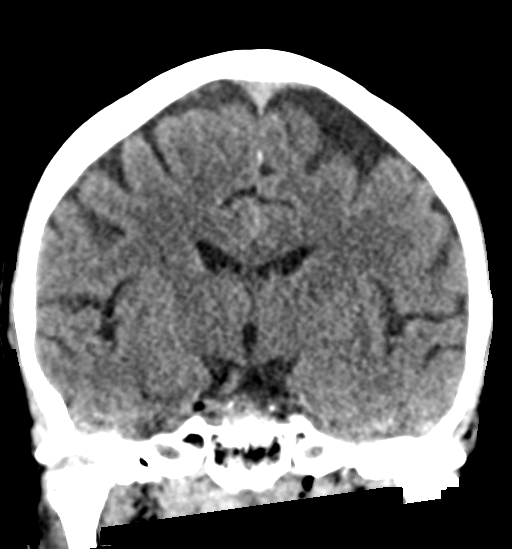
[im 37/67  brain]
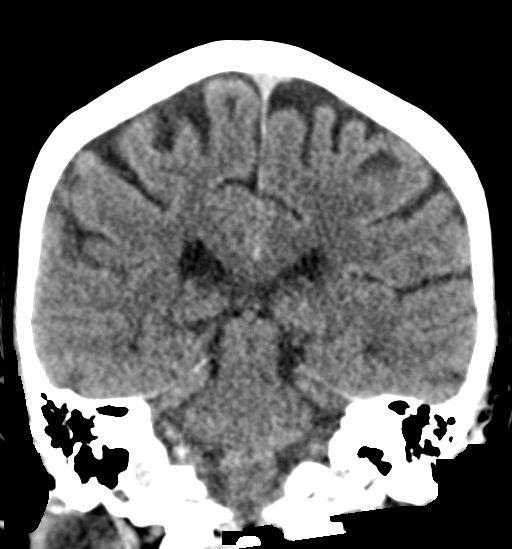

[Series 6: head without sag · sagittal · non-contrast · 0.29mm/px · 3 of 52 slices shown]
[im 18/52  brain]
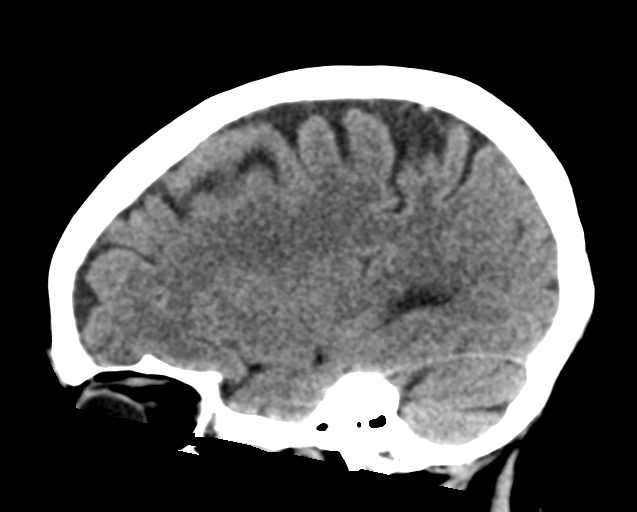
[im 26/52  brain]
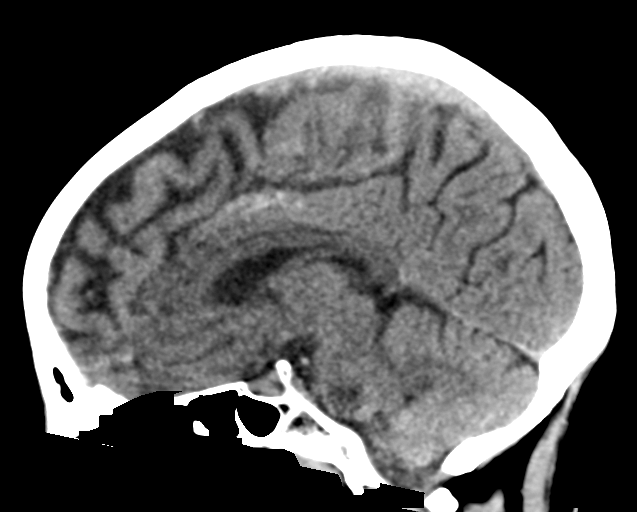
[im 35/52  brain]
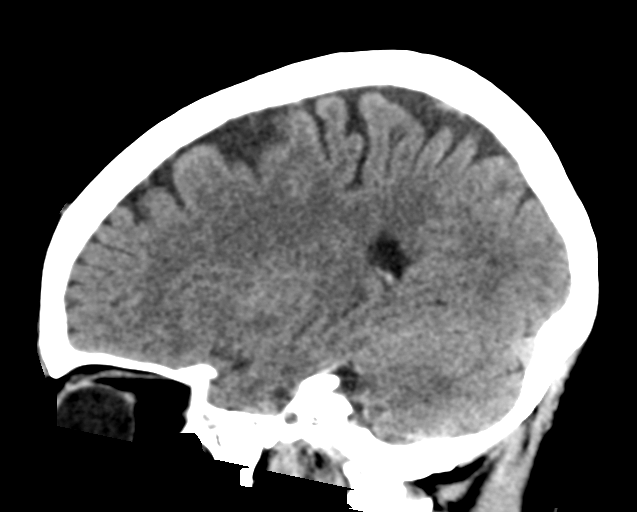

[16 of 47 positions shown; findings below may reference images not displayed]

FINDINGS: Brain: The brain shows a normal appearance without evidence of
malformation, atrophy, old or acute small or large vessel
infarction, mass lesion, hemorrhage, hydrocephalus or extra-axial
collection.

Vascular: There is atherosclerotic calcification of the major
vessels at the base of the brain.

Skull: Normal.  No traumatic finding.  No focal bone lesion.

Sinuses/Orbits: Sinuses are clear. Orbits appear normal. Mastoids
are clear.

Other: None significant
IMPRESSION: Negative head CT.

## 2023-11-28 ENCOUNTER — Other Ambulatory Visit (HOSPITAL_COMMUNITY): Payer: Self-pay

## 2023-12-01 ENCOUNTER — Encounter: Payer: Self-pay | Admitting: Hematology

## 2023-12-01 ENCOUNTER — Inpatient Hospital Stay (HOSPITAL_BASED_OUTPATIENT_CLINIC_OR_DEPARTMENT_OTHER): Payer: MEDICAID | Admitting: Hematology

## 2023-12-01 ENCOUNTER — Inpatient Hospital Stay: Payer: MEDICAID | Attending: Hematology

## 2023-12-01 VITALS — BP 134/71 | HR 80 | Temp 97.3°F | Resp 18 | Wt 99.7 lb

## 2023-12-01 DIAGNOSIS — C50412 Malignant neoplasm of upper-outer quadrant of left female breast: Secondary | ICD-10-CM

## 2023-12-01 DIAGNOSIS — C7951 Secondary malignant neoplasm of bone: Secondary | ICD-10-CM

## 2023-12-01 DIAGNOSIS — Z170 Estrogen receptor positive status [ER+]: Secondary | ICD-10-CM | POA: Diagnosis not present

## 2023-12-01 LAB — CMP (CANCER CENTER ONLY)
ALT: 19 U/L (ref 0–44)
AST: 17 U/L (ref 15–41)
Albumin: 4.7 g/dL (ref 3.5–5.0)
Alkaline Phosphatase: 115 U/L (ref 38–126)
Anion gap: 8 (ref 5–15)
BUN: 19 mg/dL (ref 6–20)
CO2: 25 mmol/L (ref 22–32)
Calcium: 9.6 mg/dL (ref 8.9–10.3)
Chloride: 104 mmol/L (ref 98–111)
Creatinine: 1.05 mg/dL — ABNORMAL HIGH (ref 0.44–1.00)
GFR, Estimated: >60 mL/min (ref >60)
Glucose, Bld: 96 mg/dL (ref 70–99)
Potassium: 3.9 mmol/L (ref 3.5–5.1)
Sodium: 137 mmol/L (ref 135–145)
Total Bilirubin: 0.6 mg/dL (ref 0.0–1.2)
Total Protein: 8.4 g/dL — ABNORMAL HIGH (ref 6.5–8.1)

## 2023-12-01 LAB — CBC WITH DIFFERENTIAL (CANCER CENTER ONLY)
Abs Immature Granulocytes: 0.02 K/uL (ref 0.00–0.07)
Basophils Absolute: 0 K/uL (ref 0.0–0.1)
Basophils Relative: 1 %
Eosinophils Absolute: 0 K/uL (ref 0.0–0.5)
Eosinophils Relative: 0 %
HCT: 35.8 % — ABNORMAL LOW (ref 36.0–46.0)
Hemoglobin: 12.3 g/dL (ref 12.0–15.0)
Immature Granulocytes: 0 %
Lymphocytes Relative: 32 %
Lymphs Abs: 1.8 K/uL (ref 0.7–4.0)
MCH: 37.6 pg — ABNORMAL HIGH (ref 26.0–34.0)
MCHC: 34.4 g/dL (ref 30.0–36.0)
MCV: 109.5 fL — ABNORMAL HIGH (ref 80.0–100.0)
Monocytes Absolute: 0.2 K/uL (ref 0.1–1.0)
Monocytes Relative: 4 %
Neutro Abs: 3.5 K/uL (ref 1.7–7.7)
Neutrophils Relative %: 63 %
Platelet Count: 503 K/uL — ABNORMAL HIGH (ref 150–400)
RBC: 3.27 MIL/uL — ABNORMAL LOW (ref 3.87–5.11)
RDW: 14.5 % (ref 11.5–15.5)
WBC Count: 5.6 K/uL (ref 4.0–10.5)
nRBC: 0 % (ref 0.0–0.2)

## 2023-12-01 NOTE — Progress Notes (Signed)
 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   HISTORY OF PRESENTING ILLNESS:    Elizabeth Mason is a wonderful 58 y.o. female who has been referred to us  by Dr Davia for evaluation and management of possible metastatic breast cancer with bone mets.   Patient has a history of smoking 1 to 2 packs/day with more than 75-pack-year history of smoking, heavy alcohol use drinks 6-8 beers daily, depression and poor overall medical follow-up. Patient presented on 06/09/2023 with shaking chills, fever abdominal pain nausea and vomiting. She was diagnosed with sepsis likely due to left renal pyelonephritis and has been started on antibiotics for that. Patient had a CT chest abdomen pelvis on 06/09/2023 to evaluate her abdominal pain and this enlarged edematous left kidney consistent with pyelonephritis without renal abscess.  She was also noted to have left-sided hydronephrosis and hydroureter without any overt calculi or obstructing masses identified.  Patient was also incidentally noted to have soft tissue density within the upper outer left breast contiguous with skin thickening and retraction in the left axilla.  She was also noted to have diffuse sclerotic lesions throughout the axial and appendicular skeleton.   Overall picture was noted to be concerning for breast cancer and therefore oncology consultation was requested. Patient has been scheduled for an ultrasound-guided biopsy of her left axillary mass on 06/16/2023.   Patient notes that she has had left upper breast thickening and progressive skin retraction over the last several months.  Even prior to that she had noted a  left axillary mass for more than a year and thought she had an ingrown hair in the axilla and infection which was intermittently draining.  She does not have a primary care physician and did not seek any help or evaluation for this.   She has had poor medical follow-up and does not recollect having had a mammogram in the last 5 years.  She is also not up to speed with her other age-appropriate cancer screening.   Patient notes that she might have lost about 15 to 20 pounds over the last year. Does not note any focal bone pains. Does endorse heavy smoking and alcohol use. Currently does live with other family members.   INTERVAL HISTORY:   Elizabeth Mason is a wonderful 58 y.o. 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  she was last seen by me on 09/23/2023; at the time she mentioned experiencing improvement in the mass under her axilla and skin metastases.    Today, she is accompanied by her daughter. She denies any new concerns and reports that she is feeling well. She continues to gains weight.   She reports she has occasional hot flashes and is experiencing night sweats. She reports that she is drinking lots of water. She reports drinking two beers a day and is limiting her alcohol consumption. She is smoking 1 pack per week, and is trying to cut down her intake.   She is tolerating her Letrozole  well. She reports having some watery, loose stools occasionally, but reports that this does not bother her.  She is tolerating zoloft  well.  Her incision around breast tissue is healed well  She denies any new lumps or bumps.   Denies any fevers/chills, abdominal pain, leg swelling, SOB, or change in breathing.  REVIEW OF SYSTEMS:   10 Point review of systems of done and is negative except as noted above.  MEDICAL HISTORY Past Medical  History:  Diagnosis Date   Breast cancer [facility])     SURGICAL HISTORY Past Surgical History:  Procedure Laterality Date   ABDOMINAL HYSTERECTOMY      SOCIAL HISTORY Social History   Tobacco Use   Smoking status: Every Day    Current packs/day: 2.00    Average packs/day: 2.0 packs/day for 38.9 years (77.7 ttl pk-yrs)    Types: Cigarettes    Start date: 68   Smokeless tobacco: Never  Vaping Use   Vaping status: Never Used  Substance Use Topics   Alcohol use: Yes    Alcohol/week: 21.0 standard drinks of alcohol    Types: 21 Cans of beer per week    Comment: occ beer   Drug use: Not Currently    Frequency: 3.0 times per week    Types: Marijuana    Social History   Social History Narrative   Not on file    SOCIAL DRIVERS OF HEALTH SDOH Screenings   Food Insecurity: No Food Insecurity (12/01/2023)  Housing: Unknown (12/01/2023)  Transportation Needs: No Transportation Needs (12/01/2023)  Utilities: Not At Risk (12/01/2023)  Alcohol Screen: Low Risk  (12/01/2023)  Depression (PHQ2-9): Low Risk  (12/01/2023)  Tobacco Use: High Risk (12/01/2023)     FAMILY HISTORY History reviewed. No pertinent family history.   ALLERGIES: has no known allergies.  MEDICATIONS  Current Outpatient Medications  Medication Sig Dispense Refill   ergocalciferol  (VITAMIN D2) 1.25 MG (50000 UT) capsule Take 1 capsule (50,000 Units total) by mouth once a week. 12 capsule 3   letrozole  (FEMARA ) 2.5 MG tablet Take 1 tablet (2.5 mg total) by mouth daily. 30 tablet 11   losartan -hydrochlorothiazide (HYZAAR) 50-12.5 MG tablet Take 1 tablet by mouth daily. 90 tablet 1   ribociclib  succ (KISQALI  400MG  DAILY DOSE) 200 MG Therapy Pack Take 2 tablets (400 mg total) by mouth daily. Take for 21 days on, 7 days off, repeat every 28 days. 42 tablet 1   ribociclib  succ (KISQALI  400MG  DAILY DOSE) 200 MG Therapy Pack Take 2 tablets (400 mg total) by mouth daily. Take for 21 days on, 7 days off, repeat  every 28 days. 42 tablet 1   sertraline  (ZOLOFT ) 50 MG tablet Take 1 tablet (50 mg total) by mouth daily. 90 tablet 3   No current facility-administered medications for this visit.    PHYSICAL EXAMINATION: ECOG PERFORMANCE STATUS: 1 - Symptomatic but completely ambulatory VITALS: Vitals:   12/01/23 1223  BP: 134/71  Pulse: 80  Resp: 18  Temp: (!) 97.3 F (36.3 C)  SpO2: 96%   Filed Weights   12/01/23 1223  Weight: 99 lb 11.2 oz (45.2 kg)   Body mass index is 18.24 kg/m.  GENERAL: alert, in no acute distress and comfortable SKIN: no acute rashes, no significant lesions EYES: conjunctiva are pink and non-injected, sclera anicteric OROPHARYNX: MMM, no exudates, no oropharyngeal erythema or ulceration NECK: supple, no JVD LYMPH:  no palpable lymphadenopathy in the cervical, axillary or inguinal regions BREAST -- left breast mass/thickening and left axillary induration much improved. LUNGS: clear to auscultation b/l with normal respiratory effort HEART: regular rate & rhythm ABDOMEN:  normoactive bowel sounds ,  non tender, not distended, no hepatosplenomegaly Extremity: no pedal edema PSYCH: alert & oriented x 3 with fluent speech NEURO: no focal motor/sensory deficits  LABORATORY DATA:   I have reviewed the data as listed     Latest Ref Rng & Units 12/01/2023   11:19 AM 09/23/2023    9:42 AM 07/22/2023    8:40 AM  CBC EXTENDED  WBC 4.0 - 10.5 K/uL 5.6  4.1  4.1   RBC 3.87 - 5.11 MIL/uL 3.27  3.57  3.61   Hemoglobin 12.0 - 15.0 g/dL 87.6  87.5  88.3   HCT 36.0 - 46.0 % 35.8  35.5  34.3   Platelets 150 - 400 K/uL 503  363  452   NEUT# 1.7 - 7.7 K/uL 3.5  2.1  2.8   Lymph# 0.7 - 4.0 K/uL 1.8  1.6  1.1       Latest Ref Rng & Units 12/01/2023   11:19 AM 09/23/2023    9:42 AM 07/22/2023    8:40 AM  CMP  Glucose 70 - 99 mg/dL 96  841  893   BUN 6 - 20 mg/dL 19  24  10    Creatinine 0.44 - 1.00 mg/dL 8.94  9.02  8.82   Sodium 135 - 145 mmol/L 137  135  137   Potassium  3.5 - 5.1 mmol/L 3.9  4.3  3.8   Chloride 98 - 111 mmol/L 104  103  100   CO2 22 - 32 mmol/L 25  24  28    Calcium 8.9 - 10.3 mg/dL 9.6  9.6  9.9   Total Protein 6.5 - 8.1 g/dL 8.4  8.3  8.7   Total Bilirubin 0.0 - 1.2 mg/dL 0.6  0.3  0.3   Alkaline Phos 38 - 126 U/L 115  97  148   AST 15 - 41 U/L 17  20  11    ALT 0 - 44 U/L 19  29  10       RADIOGRAPHIC STUDIES: I have personally reviewed the radiological images as listed and agreed with the findings in the report. Imaging Results  No results found.     06/16/2023 Ultrasound-guided biopsy of her left axillary mass:       RADIOGRAPHIC STUDIES: I have personally reviewed the radiological images as listed and agreed with the findings in the report. No results found.   EXAM: DUAL X-RAY ABSORPTIOMETRY (DXA) FOR BONE MINERAL DENSITY 07/22/2023 3:43 pm   CLINICAL DATA:  58 year old Female Postmenopausal. Patient with metastatic breast cancer on aromatase inhibitors for bone density assessment   TECHNIQUE: An axial (e.g., hips, spine) and/or appendicular (e.g., radius) exam was performed, as appropriate, using GE Secretary/administrator at [facility]. Images are obtained for bone mineral density measurement and are not obtained for diagnostic purposes. [id number]   Exclusions: L3-L4 due to degenerative changes   COMPARISON:  None.   FINDINGS: Scan quality: Good.   LUMBAR SPINE (L1-L2):   BMD (in g/cm2): 0.904   T-score: -2.2   Z-score: -1.8   LEFT FEMORAL NECK:   BMD (in g/cm2): 0.783   T-score: -1.8   Z-score: -1.6   LEFT TOTAL HIP:   BMD (in g/cm2): 0.790   T-score: -1.7   Z-score: -1.8   RIGHT FEMORAL NECK:   BMD (in g/cm2): 0.790   T-score: -1.8   Z-score: -1.5   RIGHT TOTAL HIP:   BMD (in g/cm2): 0.775   T-score: -1.8   Z-score: -2.0  LEFT FOREARM (RADIUS 33%):   BMD (in g/cm2): 0.687   T-score: -2.2   Z-score: -2.1   FRAX 10-YEAR PROBABILITY OF FRACTURE:    10-year fracture risk is performed using the [facility] FRAX calculator based on patient-reported risk factors.   Major osteoporotic fracture: 5.5% Hip fracture: 1.2%   Other situations known to alter the reliability of the FRAX score should be considered when making treatment decisions, including chronic glucocorticoid use and past treatments. Further guidance on treatment can be found at the [facility]'s website [email or web address]   IMPRESSION: Osteopenia based on BMD.  ASSESSMENT & PLAN:  58 y.o. 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   Left upper breast mass with skin changes and left axillary lymphadenopathy.  Also noted to have diffuse sclerotic bone metastases.   #2 left-sided pyelonephritis with left-sided hydronephrosis and hydroureter.    #3 hx sepsis due to pyelonephritis.   #4 heavy smoker more than 75-pack-year history of cigarette smoking   #5 history of heavy alcohol use 6-10 beers daily.   PLAN: -Had her bone density study in July: T-score -2.2, osteopenic bordering osteoporotic.  -Blood counts are stable -Anemia has resolved -Platelets are on the slightly elevated today -RBC are larger in size -Breast cancer tumor markers are pending -Discussed taking B-Complex Vitamin to help keep her red blood cell counts up -Consider doing a bone density scan to monitor osteopenia  -Recommends seeing a dentist for clearance for Prolia -Recommends consuming 1000mg -1500mg  of calcium per day to strengthen bone -Discussed reducing fall risk -Will continue to monitor tumor markers once these results come back  -continue Letrozole  + KIsquali. NO notable toxicities at this time.  Will consider increasing KIsquali to 600mg  on next visit if counts stable.   FOLLOW-UP in 2 months with Dr. Onesimo  The total time spent in the  appointment was 30 minutes* .  All of the patient's questions were answered and the patient knows to call the clinic with any problems, questions, or concerns.  Emaline Onesimo MD MS AAHIVMS [facility] Hematology/Oncology Physician [facility]  *Total Encounter Time as defined by the Centers for Medicare and Medicaid Services includes, in addition to the face-to-face time of a patient visit (documented in the note above) non-face-to-face time: obtaining and reviewing outside history, ordering and reviewing medications, tests or procedures, care coordination (communications with other health care professionals or caregivers) and documentation in the medical record.  I,Emily Lagle,acting as a neurosurgeon for Emaline Onesimo, MD.,have documented all relevant documentation on the behalf of Emaline Onesimo, MD,as directed by  Emaline Onesimo, MD while in the presence of Emaline Onesimo, MD.  I have reviewed the above documentation for accuracy and completeness, and I agree with the above.  Suren Payne, MD

## 2023-12-02 LAB — CANCER ANTIGEN 15-3: CA 15-3: 149 U/mL — ABNORMAL HIGH (ref 0.0–25.0)

## 2023-12-02 LAB — CANCER ANTIGEN 27.29: CA 27.29: 201.7 U/mL — ABNORMAL HIGH (ref 0.0–38.6)

## 2023-12-03 ENCOUNTER — Other Ambulatory Visit: Payer: Self-pay

## 2023-12-10 ENCOUNTER — Other Ambulatory Visit: Payer: Self-pay | Admitting: Hematology

## 2023-12-10 ENCOUNTER — Other Ambulatory Visit (HOSPITAL_COMMUNITY): Payer: Self-pay

## 2023-12-10 ENCOUNTER — Other Ambulatory Visit: Payer: Self-pay

## 2023-12-10 MED ORDER — RIBOCICLIB SUCC (400 MG DOSE) 200 MG PO TBPK
400.0000 mg | ORAL_TABLET | Freq: Every day | ORAL | 1 refills | Status: DC
  Filled 2023-12-10: qty 42, 21d supply, fill #0
  Filled 2023-12-11: qty 42, 28d supply, fill #0
  Filled 2024-01-04: qty 42, 28d supply, fill #1

## 2023-12-11 ENCOUNTER — Other Ambulatory Visit: Payer: Self-pay

## 2023-12-11 ENCOUNTER — Other Ambulatory Visit (HOSPITAL_COMMUNITY): Payer: Self-pay

## 2023-12-15 ENCOUNTER — Other Ambulatory Visit (HOSPITAL_COMMUNITY): Payer: Self-pay

## 2023-12-15 ENCOUNTER — Other Ambulatory Visit: Payer: Self-pay

## 2023-12-15 NOTE — Progress Notes (Signed)
 Specialty Pharmacy Refill Coordination Note  Elizabeth Mason is a 58 y.o. female contacted today regarding refills of specialty medication(s) Ribociclib  Succinate (KISQALI  400mg  Daily Dose)   Patient requested Pickup at [facility] at [facility] date: 12/15/23   Medication will be filled on: 12/15/23

## 2023-12-23 ENCOUNTER — Other Ambulatory Visit (HOSPITAL_COMMUNITY): Payer: Self-pay

## 2023-12-24 ENCOUNTER — Other Ambulatory Visit: Payer: Self-pay

## 2023-12-25 ENCOUNTER — Other Ambulatory Visit: Payer: Self-pay

## 2023-12-25 NOTE — Progress Notes (Signed)
 Specialty Pharmacy Ongoing Clinical Assessment Note  Elizabeth Mason is a 58 y.o. female who is being followed by the specialty pharmacy service for RxSp Oncology   Patient's specialty medication(s) reviewed today: Ribociclib  Succinate (KISQALI  400mg  Daily Dose)   Missed doses in the last 4 weeks: 0   Patient/Caregiver asked additional questions regarding receiving more calendars to keep up with doses - sent message to [name] to mail more to her.  Therapeutic benefit summary: Patient is achieving benefit   Adverse events/side effects summary: No adverse events/side effects   Patient's therapy is appropriate to: Continue    Goals Addressed             This Visit's Progress    Maintain optimal adherence to therapy   On track    Patient is on track. Patient will maintain adherence         Follow up: 3 months  [facility] Specialty Pharmacist

## 2024-01-04 ENCOUNTER — Other Ambulatory Visit: Payer: Self-pay

## 2024-01-06 ENCOUNTER — Other Ambulatory Visit: Payer: Self-pay

## 2024-01-06 NOTE — Progress Notes (Signed)
 Specialty Pharmacy Refill Coordination Note  Elizabeth Mason is a 58 y.o. female contacted today regarding refills of specialty medication(s) Ribociclib  Succinate (KISQALI  400mg  Daily Dose)   Patient requested Pickup at [facility] at [facility] date: 01/12/24   Medication will be filled on: 01/11/24  Spoke with patient's daughter

## 2024-01-11 ENCOUNTER — Other Ambulatory Visit: Payer: Self-pay

## 2024-01-15 ENCOUNTER — Other Ambulatory Visit: Payer: Self-pay

## 2024-01-25 ENCOUNTER — Emergency Department (HOSPITAL_COMMUNITY): Payer: MEDICAID

## 2024-01-25 ENCOUNTER — Emergency Department (HOSPITAL_COMMUNITY)
Admission: EM | Admit: 2024-01-25 | Discharge: 2024-01-26 | Disposition: A | Discharge status: Home / Self Care | Payer: MEDICAID | Attending: Emergency Medicine | Admitting: Emergency Medicine

## 2024-01-25 DIAGNOSIS — I10 Essential (primary) hypertension: Secondary | ICD-10-CM | POA: Insufficient documentation

## 2024-01-25 DIAGNOSIS — R55 Syncope and collapse: Secondary | ICD-10-CM | POA: Diagnosis present

## 2024-01-25 DIAGNOSIS — Z853 Personal history of malignant neoplasm of breast: Secondary | ICD-10-CM | POA: Insufficient documentation

## 2024-01-25 DIAGNOSIS — Z79899 Other long term (current) drug therapy: Secondary | ICD-10-CM | POA: Diagnosis not present

## 2024-01-25 DIAGNOSIS — E876 Hypokalemia: Secondary | ICD-10-CM | POA: Insufficient documentation

## 2024-01-25 LAB — COMPREHENSIVE METABOLIC PANEL WITH GFR
ALT: 24 U/L (ref 0–44)
AST: 28 U/L (ref 15–41)
Albumin: 3.9 g/dL (ref 3.5–5.0)
Alkaline Phosphatase: 100 U/L (ref 38–126)
Anion gap: 14 (ref 5–15)
BUN: 29 mg/dL — ABNORMAL HIGH (ref 6–20)
CO2: 21 mmol/L — ABNORMAL LOW (ref 22–32)
Calcium: 9 mg/dL (ref 8.9–10.3)
Chloride: 100 mmol/L (ref 98–111)
Creatinine, Ser: 1.38 mg/dL — ABNORMAL HIGH (ref 0.44–1.00)
GFR, Estimated: 44 mL/min — ABNORMAL LOW
Glucose, Bld: 142 mg/dL — ABNORMAL HIGH (ref 70–99)
Potassium: 3.3 mmol/L — ABNORMAL LOW (ref 3.5–5.1)
Sodium: 135 mmol/L (ref 135–145)
Total Bilirubin: <0.2 mg/dL (ref 0.0–1.2)
Total Protein: 6.9 g/dL (ref 6.5–8.1)

## 2024-01-25 LAB — CBC WITH DIFFERENTIAL/PLATELET
Abs Immature Granulocytes: 0.02 K/uL (ref 0.00–0.07)
Basophils Absolute: 0 K/uL (ref 0.0–0.1)
Basophils Relative: 0 %
Eosinophils Absolute: 0 K/uL (ref 0.0–0.5)
Eosinophils Relative: 0 %
HCT: 31.4 % — ABNORMAL LOW (ref 36.0–46.0)
Hemoglobin: 11.4 g/dL — ABNORMAL LOW (ref 12.0–15.0)
Immature Granulocytes: 0 %
Lymphocytes Relative: 25 %
Lymphs Abs: 1.2 K/uL (ref 0.7–4.0)
MCH: 38.3 pg — ABNORMAL HIGH (ref 26.0–34.0)
MCHC: 36.3 g/dL — ABNORMAL HIGH (ref 30.0–36.0)
MCV: 105.4 fL — ABNORMAL HIGH (ref 80.0–100.0)
Monocytes Absolute: 0.4 K/uL (ref 0.1–1.0)
Monocytes Relative: 9 %
Neutro Abs: 3.1 K/uL (ref 1.7–7.7)
Neutrophils Relative %: 66 %
Platelets: 356 K/uL (ref 150–400)
RBC: 2.98 MIL/uL — ABNORMAL LOW (ref 3.87–5.11)
RDW: 12.7 % (ref 11.5–15.5)
WBC: 4.7 K/uL (ref 4.0–10.5)
nRBC: 0 % (ref 0.0–0.2)

## 2024-01-25 LAB — URINALYSIS, ROUTINE W REFLEX MICROSCOPIC
Bacteria, UA: NONE SEEN
Bilirubin Urine: NEGATIVE
Glucose, UA: NEGATIVE mg/dL
Ketones, ur: NEGATIVE mg/dL
Nitrite: NEGATIVE
Protein, ur: NEGATIVE mg/dL
Specific Gravity, Urine: 1.014 (ref 1.005–1.030)
pH: 6 (ref 5.0–8.0)

## 2024-01-25 LAB — ETHANOL: Alcohol, Ethyl (B): <15 mg/dL

## 2024-01-25 LAB — CBG MONITORING, ED: Glucose-Capillary: 135 mg/dL — ABNORMAL HIGH (ref 70–99)

## 2024-01-25 LAB — TYPE AND SCREEN
ABO/RH(D): A POS
Antibody Screen: NEGATIVE

## 2024-01-25 LAB — PROTIME-INR
INR: 1 (ref 0.8–1.2)
Prothrombin Time: 13.3 s (ref 11.4–15.2)

## 2024-01-25 LAB — TROPONIN T, HIGH SENSITIVITY: Troponin T High Sensitivity: <15 ng/L (ref 0–19)

## 2024-01-25 MED ORDER — POTASSIUM CHLORIDE CRYS ER 20 MEQ PO TBCR
40.0000 meq | EXTENDED_RELEASE_TABLET | Freq: Once | ORAL | Status: AC
  Administered 2024-01-26: 40 meq via ORAL
  Filled 2024-01-25: qty 2

## 2024-01-25 MED ORDER — LACTATED RINGERS IV BOLUS
1000.0000 mL | Freq: Once | INTRAVENOUS | Status: AC
  Administered 2024-01-26: 1000 mL via INTRAVENOUS

## 2024-01-25 NOTE — ED Triage Notes (Signed)
 Pt BIBA from home with c/o of two syncopal episodes, family would like her checked out. Lasted less than 60 sec. Ortho stat about 20 points. Pt admitted to ETOH use.   20G L FA Given 500 NS en route.  BP 110/60

## 2024-01-25 NOTE — ED Provider Notes (Signed)
 " [place] EMERGENCY DEPARTMENT AT [facility] Provider Note   CSN: [id number] Arrival date & time: 01/25/24  1949     Patient presents with: Near Syncope and Hypotension   Elizabeth Mason is a 59 y.o. female.   HPI Patient presents for syncope.  Medical history includes breast cancer, depression, alcohol abuse, HTN.  She is on letrozole  and Kisquali.  She states that she was in her normal state of health yesterday.  She reports that she is eating and drinking normally.  She has been having increased stools which she attributes to her cancer medications.  She did note black stools yesterday.  Today, she has had dizziness and lightheadedness with standing.  She reportedly had 2 episodes of syncope at home.  EMS was called.  EMS noted blood pressure of 100/60.  SBP would drop 20 points with standing.  She did receive 500 cc IVF prior to arrival.  Prior to Admission medications  Medication Sig Start Date End Date Taking? Authorizing Provider  ergocalciferol  (VITAMIN D2) 1.25 MG (50000 UT) capsule Take 1 capsule (50,000 Units total) by mouth once a week. 09/23/23   Onesimo Emaline Brink, MD  letrozole  (FEMARA ) 2.5 MG tablet Take 1 tablet (2.5 mg total) by mouth daily. 09/23/23   Onesimo Emaline Brink, MD  losartan -hydrochlorothiazide (HYZAAR) 50-12.5 MG tablet Take 1 tablet by mouth daily. 09/15/23   Jerrell Cleatus Ned, MD  ribociclib  succ (KISQALI  400MG  DAILY DOSE) 200 MG Therapy Pack Take 2 tablets (400 mg total) by mouth daily. Take for 21 days on, 7 days off, repeat every 28 days. 08/20/23   Kale, Gautam Kishore, MD  ribociclib  succ (KISQALI  400MG  DAILY DOSE) 200 MG Therapy Pack Take 2 tablets (400 mg total) by mouth daily. Take for 21 days on, 7 days off, repeat every 28 days. 12/10/23   Kale, Gautam Kishore, MD  sertraline  (ZOLOFT ) 50 MG tablet Take 1 tablet (50 mg total) by mouth daily. 10/06/23   Jerrell Cleatus Ned, MD    Allergies: Patient has no known allergies.    Review of  Systems  Neurological:  Positive for syncope and light-headedness.  All other systems reviewed and are negative.   Updated Vital Signs BP 104/73   Pulse 87   Temp 98.3 F (36.8 C) (Oral)   Resp (!) 21   Ht 5' 2 (1.575 m)   Wt 44.9 kg   SpO2 94%   BMI 18.11 kg/m   Physical Exam Vitals and nursing note reviewed.  Constitutional:      General: She is not in acute distress.    Appearance: Normal appearance. She is well-developed. She is not ill-appearing, toxic-appearing or diaphoretic.  HENT:     Head: Normocephalic and atraumatic.     Right Ear: External ear normal.     Left Ear: External ear normal.     Nose: Nose normal.     Mouth/Throat:     Mouth: Mucous membranes are moist.  Eyes:     Extraocular Movements: Extraocular movements intact.     Conjunctiva/sclera: Conjunctivae normal.  Cardiovascular:     Rate and Rhythm: Normal rate and regular rhythm.     Heart sounds: No murmur heard. Pulmonary:     Effort: Pulmonary effort is normal. No respiratory distress.     Breath sounds: Normal breath sounds. No wheezing or rales.  Chest:     Chest wall: No tenderness.  Abdominal:     General: There is no distension.     Palpations:  Abdomen is soft.     Tenderness: There is no abdominal tenderness.  Musculoskeletal:        General: No swelling or deformity. Normal range of motion.     Cervical back: Normal range of motion and neck supple.     Right lower leg: No edema.     Left lower leg: No edema.  Skin:    General: Skin is warm and dry.     Coloration: Skin is not jaundiced or pale.  Neurological:     General: No focal deficit present.     Mental Status: She is alert and oriented to person, place, and time.     Cranial Nerves: No cranial nerve deficit.     Sensory: No sensory deficit.     Motor: No weakness.     Coordination: Coordination normal.  Psychiatric:        Mood and Affect: Mood normal.        Behavior: Behavior normal.     (all labs ordered are  listed, but only abnormal results are displayed) Labs Reviewed  COMPREHENSIVE METABOLIC PANEL WITH GFR - Abnormal; Notable for the following components:      Result Value   Potassium 3.3 (*)    CO2 21 (*)    Glucose, Bld 142 (*)    BUN 29 (*)    Creatinine, Ser 1.38 (*)    GFR, Estimated 44 (*)    All other components within normal limits  CBC WITH DIFFERENTIAL/PLATELET - Abnormal; Notable for the following components:   RBC 2.98 (*)    Hemoglobin 11.4 (*)    HCT 31.4 (*)    MCV 105.4 (*)    MCH 38.3 (*)    MCHC 36.3 (*)    All other components within normal limits  URINALYSIS, ROUTINE W REFLEX MICROSCOPIC - Abnormal; Notable for the following components:   Hgb urine dipstick SMALL (*)    Leukocytes,Ua MODERATE (*)    All other components within normal limits  CBG MONITORING, ED - Abnormal; Notable for the following components:   Glucose-Capillary 135 (*)    All other components within normal limits  PROTIME-INR  ETHANOL  POC OCCULT BLOOD, ED  TYPE AND SCREEN  ABO/RH  TROPONIN T, HIGH SENSITIVITY  TROPONIN T, HIGH SENSITIVITY    EKG: EKG Interpretation Date/Time:  Monday January 25 2024 20:29:50 EST Ventricular Rate:  86 PR Interval:  145 QRS Duration:  74 QT Interval:  362 QTC Calculation: 433 R Axis:   42  Text Interpretation: Sinus rhythm Borderline low voltage, extremity leads Nonspecific T abnrm, anterolateral leads Confirmed by Melvenia Motto ([id number]) on 01/25/2024 8:52:18 PM  Radiology: CT HEAD WO CONTRAST Result Date: 01/25/2024 EXAM: CT HEAD WITHOUT 01/25/2024 10:17:51 PM TECHNIQUE: CT of the head was performed without the administration of intravenous contrast. Automated exposure control, iterative reconstruction, and/or weight based adjustment of the mA/kV was utilized to reduce the radiation dose to as low as reasonably achievable. COMPARISON: 04/11/2021 CLINICAL HISTORY: Syncope/presyncope, cerebrovascular cause suspected. FINDINGS: BRAIN AND VENTRICLES: No acute  intracranial hemorrhage. No mass effect or midline shift. No extra-axial fluid collection. No evidence of acute infarct. No hydrocephalus. ORBITS: No acute abnormality. SINUSES AND MASTOIDS: No acute abnormality. SOFT TISSUES AND SKULL: No acute skull fracture. No acute soft tissue abnormality. IMPRESSION: 1. No acute intracranial abnormality. Electronically signed by: Franky Crease MD 01/25/2024 10:20 PM EST RP Workstation: [id number]   DG Chest Port 1 View Result Date: 01/25/2024 EXAM: 1 VIEW(S) XRAY OF THE  CHEST 01/25/2024 08:18:00 PM COMPARISON: 06/12/2023 CLINICAL HISTORY: syncope FINDINGS: LUNGS AND PLEURA: No focal pulmonary opacity. No pleural effusion. No pneumothorax. HEART AND MEDIASTINUM: No acute abnormality of the cardiac and mediastinal silhouettes. BONES AND SOFT TISSUES: No acute osseous abnormality. IMPRESSION: 1. No acute process. Electronically signed by: Elsie Gravely MD 01/25/2024 08:24 PM EST RP Workstation: [id number]     Procedures   Medications Ordered in the ED  lactated ringers  bolus 1,000 mL (has no administration in time range)  potassium chloride  SA (KLOR-CON  M) CR tablet 40 mEq (40 mEq Oral Given 01/26/24 0007)                                    Medical Decision Making Amount and/or Complexity of Data Reviewed Labs: ordered. Radiology: ordered.   This patient presents to the ED for concern of syncope, this involves an extensive number of treatment options, and is a complaint that carries with it a high risk of complications and morbidity.  The differential diagnosis includes dehydration, arrhythmia, vasovagal episode, intoxication, metabolic derangements, polypharmacy   Co morbidities / Chronic conditions that complicate the patient evaluation  breast cancer, depression, alcohol abuse, HTN   Additional history obtained:  Additional history obtained from EMR External records from outside source obtained and reviewed including EMS, patient's  daughter   Lab Tests:  I Ordered, and personally interpreted labs.  The pertinent results include: Baseline hemoglobin, no leukocytosis, creatinine slightly increased at baseline, mild hypokalemia with otherwise normal electrolytes, normal troponin   Imaging Studies ordered:  I ordered imaging studies including chest x-ray, CT head I independently visualized and interpreted imaging which showed no acute findings I agree with the radiologist interpretation   Cardiac Monitoring: / EKG:  The patient was maintained on a cardiac monitor.  I personally viewed and interpreted the cardiac monitored which showed an underlying rhythm of: Sinus rhythm   Problem List / ED Course / Critical interventions / Medication management  Patient presenting for syncope.  She reports lightheadedness with standing that started today.  She did have black stools yesterday.  EMS noted orthostatic hypotension on scene.  She did receive 500 cc of IVF prior to arrival.  On arrival, patient is well-appearing.  She denies any symptoms at rest.  Her breathing is unlabored.  She has no areas of pain or tenderness.  Patient was placed on monitor.  Workup was initiated.  Patient's lab work notable for mild increase in creatinine from baseline and mild hypokalemia.  IV fluids and potassium chloride  were ordered.  Although she did have some black stools yesterday, hemoglobin appears to be baseline.  On DRE, no melena present.  Hemoccult testing was negative.  Imaging studies did not show any acute findings.  Patient remained in normal sinus rhythm while in the emergency department.  Orthostatic vital signs were reassessed.  Patient no longer has a drop in blood pressure with standing.  She does have increased heart rate with standing.  She denies any near syncopal symptoms with standing.  Her blood pressure remains soft.  Patient reports that her blood pressure is typically high.  She does take blood pressure medications but takes  these in the morning.  Given her ongoing breast cancer, CT was ordered.  Patient will require reassessment after IV fluids.  Care of patient signed out to oncoming ED provider. I ordered medication including IV fluids for hydration, potassium chloride  for hypokalemia Reevaluation  of the patient after these medicines showed that the patient improved I have reviewed the patients home medicines and have made adjustments as needed  Social Determinants of Health:  Lives at home with family     Final diagnoses:  Syncope, unspecified syncope type    ED Discharge Orders     None          Melvenia Motto, MD 01/26/24 0024  "

## 2024-01-25 NOTE — ED Notes (Addendum)
 POC Blood occult test result came back NEG (-). RN and MD aware.

## 2024-01-26 ENCOUNTER — Emergency Department (HOSPITAL_COMMUNITY): Payer: MEDICAID

## 2024-01-26 LAB — TROPONIN T, HIGH SENSITIVITY: Troponin T High Sensitivity: <15 ng/L (ref 0–19)

## 2024-01-26 MED ORDER — IOHEXOL 350 MG/ML SOLN
60.0000 mL | Freq: Once | INTRAVENOUS | Status: AC | PRN
  Administered 2024-01-26: 60 mL via INTRAVENOUS

## 2024-01-26 NOTE — Discharge Instructions (Signed)
 You were evaluated in the Emergency Department and after careful evaluation, we did not find any emergent condition requiring admission or further testing in the hospital.  Your exam/testing today is overall reassuring.  Increase fluids at home as we discussed, follow-up with your regular doctors.  Please return to the Emergency Department if you experience any worsening of your condition.   Thank you for allowing us  to be a part of your care.

## 2024-01-26 NOTE — ED Provider Notes (Signed)
" °  Provider Note MRN:  [id number]  Arrival date & time: 01/26/2024    ED Course and Medical Decision Making  Assumed care of patient at sign-out or upon transfer.  2 episodes of syncope or near syncope, known breast cancer awaiting PE study.  Candidate for discharge if passes ambulation test, no emergent findings.   4:20 AM update: Patient sleeping comfortably on reassessment, wakes easily, normal vital signs, feels like her normal self.  Workup is reassuring with no evidence of PE.  Plan is for discharge, will follow-up with her regular doctors.  Procedures  Final Clinical Impressions(s) / ED Diagnoses     ICD-10-CM   1. Syncope, unspecified syncope type  R55       ED Discharge Orders     None         Discharge Instructions      You were evaluated in the Emergency Department and after careful evaluation, we did not find any emergent condition requiring admission or further testing in the hospital.  Your exam/testing today is overall reassuring.  Increase fluids at home as we discussed, follow-up with your regular doctors.  Please return to the Emergency Department if you experience any worsening of your condition.   Thank you for allowing us  to be a part of your care.      Ozell HERO. Theadore, MD [facility] Emergency Medicine [facility] [email or web address]    Theadore Ozell HERO, MD 01/26/24 [phone]  "

## 2024-01-29 ENCOUNTER — Other Ambulatory Visit (HOSPITAL_COMMUNITY): Payer: Self-pay

## 2024-02-01 ENCOUNTER — Other Ambulatory Visit: Payer: Self-pay

## 2024-02-01 DIAGNOSIS — C50412 Malignant neoplasm of upper-outer quadrant of left female breast: Secondary | ICD-10-CM

## 2024-02-02 ENCOUNTER — Inpatient Hospital Stay: Payer: MEDICAID | Admitting: Hematology

## 2024-02-02 ENCOUNTER — Inpatient Hospital Stay: Payer: MEDICAID | Attending: Hematology

## 2024-02-02 VITALS — BP 117/73 | HR 107 | Temp 97.0°F | Resp 18 | Wt 100.6 lb

## 2024-02-02 DIAGNOSIS — C7951 Secondary malignant neoplasm of bone: Secondary | ICD-10-CM

## 2024-02-02 DIAGNOSIS — C50412 Malignant neoplasm of upper-outer quadrant of left female breast: Secondary | ICD-10-CM

## 2024-02-02 DIAGNOSIS — Z170 Estrogen receptor positive status [ER+]: Secondary | ICD-10-CM | POA: Diagnosis not present

## 2024-02-02 LAB — CBC WITH DIFFERENTIAL (CANCER CENTER ONLY)
Abs Immature Granulocytes: 0 K/uL (ref 0.00–0.07)
Basophils Absolute: 0 K/uL (ref 0.0–0.1)
Basophils Relative: 1 %
Eosinophils Absolute: 0 K/uL (ref 0.0–0.5)
Eosinophils Relative: 0 %
HCT: 37.2 % (ref 36.0–46.0)
Hemoglobin: 13 g/dL (ref 12.0–15.0)
Immature Granulocytes: 0 %
Lymphocytes Relative: 34 %
Lymphs Abs: 1.4 K/uL (ref 0.7–4.0)
MCH: 36.8 pg — ABNORMAL HIGH (ref 26.0–34.0)
MCHC: 34.9 g/dL (ref 30.0–36.0)
MCV: 105.4 fL — ABNORMAL HIGH (ref 80.0–100.0)
Monocytes Absolute: 0.2 K/uL (ref 0.1–1.0)
Monocytes Relative: 5 %
Neutro Abs: 2.4 K/uL (ref 1.7–7.7)
Neutrophils Relative %: 60 %
Platelet Count: 430 K/uL — ABNORMAL HIGH (ref 150–400)
RBC: 3.53 MIL/uL — ABNORMAL LOW (ref 3.87–5.11)
RDW: 12.9 % (ref 11.5–15.5)
WBC Count: 4 K/uL (ref 4.0–10.5)
nRBC: 0 % (ref 0.0–0.2)

## 2024-02-02 LAB — CMP (CANCER CENTER ONLY)
ALT: 22 U/L (ref 0–44)
AST: 25 U/L (ref 15–41)
Albumin: 4.6 g/dL (ref 3.5–5.0)
Alkaline Phosphatase: 128 U/L — ABNORMAL HIGH (ref 38–126)
Anion gap: 15 (ref 5–15)
BUN: 15 mg/dL (ref 6–20)
CO2: 27 mmol/L (ref 22–32)
Calcium: 9.9 mg/dL (ref 8.9–10.3)
Chloride: 98 mmol/L (ref 98–111)
Creatinine: 1.25 mg/dL — ABNORMAL HIGH (ref 0.44–1.00)
GFR, Estimated: 49 mL/min — ABNORMAL LOW
Glucose, Bld: 147 mg/dL — ABNORMAL HIGH (ref 70–99)
Potassium: 4.4 mmol/L (ref 3.5–5.1)
Sodium: 139 mmol/L (ref 135–145)
Total Bilirubin: 0.3 mg/dL (ref 0.0–1.2)
Total Protein: 8.7 g/dL — ABNORMAL HIGH (ref 6.5–8.1)

## 2024-02-02 NOTE — Progress Notes (Signed)
 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  HISTORY OF PRESENTING ILLNESS: Elizabeth Mason is a wonderful 59 y.o. female who has been referred to us  by Dr Davia for evaluation and management of possible metastatic breast cancer with bone mets.   Patient has a history of smoking 1 to 2 packs/day with more than 75-pack-year history of smoking, heavy alcohol use drinks 6-8 beers daily, depression and poor overall medical follow-up. Patient presented on 06/09/2023 with shaking chills, fever abdominal pain nausea and vomiting. She was diagnosed with sepsis likely due to left renal pyelonephritis and has been started on antibiotics for that. Patient had a CT chest abdomen pelvis on 06/09/2023 to evaluate her abdominal pain and this enlarged edematous left kidney consistent with pyelonephritis without renal abscess.  She was also noted to have left-sided hydronephrosis and hydroureter without any overt calculi or obstructing masses identified.  Patient was also incidentally noted to have soft tissue density within the upper outer left breast contiguous with skin thickening and retraction in the left axilla.  She was also noted to have diffuse sclerotic lesions throughout the axial and appendicular skeleton.   Overall picture was noted to be concerning for breast cancer and therefore oncology consultation was requested. Patient has been scheduled for an ultrasound-guided biopsy of her left axillary mass on 06/16/2023.   Patient notes that she has had left upper breast thickening and progressive skin retraction over the last several months.  Even prior to that she had noted a left  axillary mass for more than a year and thought she had an ingrown hair in the axilla and infection which was intermittently draining.  She does not have a primary care physician and did not seek any help or evaluation for this.   She has had poor medical follow-up and does not recollect having had a mammogram in the last 5 years.  She is also not up to speed with her other age-appropriate cancer screening.   Patient notes that she might have lost about 15 to 20 pounds over the last year. Does not note any focal bone pains. Does endorse heavy smoking and alcohol use. Currently does live with other family members.   SUMMARY OF ONCOLOGIC HISTORY: Oncology History   No problem history exists.    INTERVAL HISTORY: Elizabeth Mason is a 59 y.o. 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   she was last seen by me on 12/01/2023; at the time she mentioned experiencing occasional night sweats and hot flashes.  Today, she reports her recent syncopal episodes and ED visits. She endorses the onset of both syncopal episodes was standing up too quickly. She denies any additional symptoms to her syncope. She reports dehydration was most likely the cause of her synocope. She reports drinking about 4-5 16oz bottles of water per day. She takes her BP at home and her highest readings were 187/x.   She denies excessive bowel movements or lightheadedness currently. She denies appetite changes and dental changes. She denies any leg swelling or changes to bowel/urinary habits.   REVIEW OF SYSTEMS:   10 Point  review of systems of done and is negative except as noted above.  MEDICAL HISTORY Past Medical History:  Diagnosis Date   Breast cancer (HCC)     SURGICAL HISTORY Past Surgical History:  Procedure Laterality Date   ABDOMINAL HYSTERECTOMY       SOCIAL HISTORY Social History[1]  Social History   Social History Narrative   Not on file    SOCIAL DRIVERS OF HEALTH SDOH Screenings   Food Insecurity: No Food Insecurity (12/01/2023)  Housing: Unknown (12/01/2023)  Transportation Needs: No Transportation Needs (12/01/2023)  Utilities: Not At Risk (12/01/2023)  Alcohol Screen: Low Risk (12/01/2023)  Depression (PHQ2-9): Low Risk (12/01/2023)  Tobacco Use: High Risk (12/01/2023)     FAMILY HISTORY No family history on file.   ALLERGIES: has no known allergies.  MEDICATIONS  Current Outpatient Medications  Medication Sig Dispense Refill   ergocalciferol  (VITAMIN D2) 1.25 MG (50000 UT) capsule Take 1 capsule (50,000 Units total) by mouth once a week. 12 capsule 3   letrozole  (FEMARA ) 2.5 MG tablet Take 1 tablet (2.5 mg total) by mouth daily. 30 tablet 11   losartan -hydrochlorothiazide (HYZAAR) 50-12.5 MG tablet Take 1 tablet by mouth daily. 90 tablet 1   ribociclib  succ (KISQALI  400MG  DAILY DOSE) 200 MG Therapy Pack Take 2 tablets (400 mg total) by mouth daily. Take for 21 days on, 7 days off, repeat every 28 days. 42 tablet 1   ribociclib  succ (KISQALI  400MG  DAILY DOSE) 200 MG Therapy Pack Take 2 tablets (400 mg total) by mouth daily. Take for 21 days on, 7 days off, repeat every 28 days. 42 tablet 1   sertraline  (ZOLOFT ) 50 MG tablet Take 1 tablet (50 mg total) by mouth daily. 90 tablet 3   No current facility-administered medications for this visit.    PHYSICAL EXAMINATION: ECOG PERFORMANCE STATUS: 1 - Symptomatic but completely ambulatory VITALS: Vitals:   02/02/24 1228  BP: 117/73  Pulse: (!) 107  Resp: 18  Temp: (!) 97 F (36.1 C)  SpO2: 98%   Filed Weights   02/02/24 1228  Weight: 100 lb 9.6 oz (45.6 kg)   Body mass index is 18.4 kg/m.  GENERAL: alert, in no acute distress and comfortable SKIN: no acute rashes, no significant lesions EYES: conjunctiva are pink and non-injected, sclera  anicteric OROPHARYNX: MMM, no exudates, no oropharyngeal erythema or ulceration NECK: supple, no JVD LYMPH:  no palpable lymphadenopathy in the cervical, axillary or inguinal regions LUNGS: clear to auscultation b/l with normal respiratory effort HEART: regular rate & rhythm ABDOMEN:  normoactive bowel sounds , non tender, not distended, no hepatosplenomegaly Extremity: no pedal edema PSYCH: alert & oriented x 3 with fluent speech NEURO: no focal motor/sensory deficits  LABORATORY DATA:   I have reviewed the data as listed     Latest Ref Rng & Units 01/25/2024    8:23 PM 12/01/2023   11:19 AM 09/23/2023    9:42 AM  CBC EXTENDED  WBC 4.0 - 10.5 K/uL 4.7  5.6  4.1   RBC 3.87 - 5.11 MIL/uL 2.98  3.27  3.57   Hemoglobin 12.0 - 15.0 g/dL 88.5  87.6  87.5   HCT 36.0 - 46.0 % 31.4  35.8  35.5   Platelets 150 - 400 K/uL 356  503  363   NEUT# 1.7 - 7.7 K/uL 3.1  3.5  2.1   Lymph# 0.7 - 4.0 K/uL 1.2  1.8  1.6        Latest Ref Rng & Units  01/25/2024    8:23 PM 12/01/2023   11:19 AM 09/23/2023    9:42 AM  CMP  Glucose 70 - 99 mg/dL 857  96  841   BUN 6 - 20 mg/dL 29  19  24    Creatinine 0.44 - 1.00 mg/dL 8.61  8.94  9.02   Sodium 135 - 145 mmol/L 135  137  135   Potassium 3.5 - 5.1 mmol/L 3.3  3.9  4.3   Chloride 98 - 111 mmol/L 100  104  103   CO2 22 - 32 mmol/L 21  25  24    Calcium 8.9 - 10.3 mg/dL 9.0  9.6  9.6   Total Protein 6.5 - 8.1 g/dL 6.9  8.4  8.3   Total Bilirubin 0.0 - 1.2 mg/dL <9.7  0.6  0.3   Alkaline Phos 38 - 126 U/L 100  115  97   AST 15 - 41 U/L 28  17  20    ALT 0 - 44 U/L 24  19  29     06/16/2023 Ultrasound-guided biopsy of her left axillary mass:        RADIOGRAPHIC STUDIES: I have personally reviewed the radiological images as listed and agreed with the findings in the report. Imaging Results  No results found.      EXAM: DUAL X-RAY ABSORPTIOMETRY (DXA) FOR BONE MINERAL DENSITY 07/22/2023 3:43 pm   CLINICAL DATA:  59 year old Female  Postmenopausal. Patient with metastatic breast cancer on aromatase inhibitors for bone density assessment   TECHNIQUE: An axial (e.g., hips, spine) and/or appendicular (e.g., radius) exam was performed, as appropriate, using GE Secretary/administrator at [facility]. Images are obtained for bone mineral density measurement and are not obtained for diagnostic purposes. [id number]   Exclusions: L3-L4 due to degenerative changes   COMPARISON:  None.   FINDINGS: Scan quality: Good.   LUMBAR SPINE (L1-L2):   BMD (in g/cm2): 0.904   T-score: -2.2   Z-score: -1.8   LEFT FEMORAL NECK:   BMD (in g/cm2): 0.783   T-score: -1.8   Z-score: -1.6   LEFT TOTAL HIP:   BMD (in g/cm2): 0.790   T-score: -1.7   Z-score: -1.8   RIGHT FEMORAL NECK:   BMD (in g/cm2): 0.790   T-score: -1.8   Z-score: -1.5   RIGHT TOTAL HIP:   BMD (in g/cm2): 0.775   T-score: -1.8   Z-score: -2.0   LEFT FOREARM (RADIUS 33%):   BMD (in g/cm2): 0.687   T-score: -2.2   Z-score: -2.1   FRAX 10-YEAR PROBABILITY OF FRACTURE:   10-year fracture risk is performed using the [facility] FRAX calculator based on patient-reported risk factors.   Major osteoporotic fracture: 5.5% Hip fracture: 1.2%   Other situations known to alter the reliability of the FRAX score should be considered when making treatment decisions, including chronic glucocorticoid use and past treatments. Further guidance on treatment can be found at the [facility] Osteoporosis Foundation's website [email or web address]   IMPRESSION: Osteopenia based on BMD.  RADIOGRAPHIC STUDIES: I have personally reviewed the radiological images as listed and agreed with the findings in the report. CT Angio Chest PE W and/or Wo Contrast Result Date: 01/26/2024 EXAM: CTA CHEST 01/26/2024 01:19:00 AM TECHNIQUE: CTA of the chest was performed without and with the administration of 60 mL of iohexol  (OMNIPAQUE )  350 MG/ML injection. Multiplanar reformatted images are provided for review. MIP images are provided for review. Automated exposure control, iterative reconstruction, and/or weight based  adjustment of the mA/kV was utilized to reduce the radiation dose to as low as reasonably achievable. COMPARISON: 06/13/2023 CLINICAL HISTORY: Pulmonary embolism (PE) suspected, high prob. FINDINGS: PULMONARY ARTERIES: Pulmonary arteries are adequately opacified for evaluation. No acute pulmonary embolus. Main pulmonary artery is normal in caliber. MEDIASTINUM: The heart and pericardium demonstrate no acute abnormality. There is no acute abnormality of the thoracic aorta. LYMPH NODES: No mediastinal, hilar or axillary lymphadenopathy. LUNGS AND PLEURA: The lungs are without acute process. No focal consolidation or pulmonary edema. No evidence of pleural effusion or pneumothorax. UPPER ABDOMEN: Fullness of the left adrenal gland is stable. SOFT TISSUES AND BONES: Skin thickening again noted in the left upper lateral breast extending into the left axilla, stable since prior study. Sclerotic lesions through the visualized osseous structures, progressed since prior study compatible with worsening osseous metastatic disease. IMPRESSION: 1. No pulmonary embolism. 2. Worsening osseous metastatic disease with sclerotic lesions. Electronically signed by: Franky Crease MD 01/26/2024 01:36 AM EST RP Workstation: [id number]   CT HEAD WO CONTRAST Result Date: 01/25/2024 EXAM: CT HEAD WITHOUT 01/25/2024 10:17:51 PM TECHNIQUE: CT of the head was performed without the administration of intravenous contrast. Automated exposure control, iterative reconstruction, and/or weight based adjustment of the mA/kV was utilized to reduce the radiation dose to as low as reasonably achievable. COMPARISON: 04/11/2021 CLINICAL HISTORY: Syncope/presyncope, cerebrovascular cause suspected. FINDINGS: BRAIN AND VENTRICLES: No acute intracranial hemorrhage. No mass  effect or midline shift. No extra-axial fluid collection. No evidence of acute infarct. No hydrocephalus. ORBITS: No acute abnormality. SINUSES AND MASTOIDS: No acute abnormality. SOFT TISSUES AND SKULL: No acute skull fracture. No acute soft tissue abnormality. IMPRESSION: 1. No acute intracranial abnormality. Electronically signed by: Franky Crease MD 01/25/2024 10:20 PM EST RP Workstation: [id number]   DG Chest Port 1 View Result Date: 01/25/2024 EXAM: 1 VIEW(S) XRAY OF THE CHEST 01/25/2024 08:18:00 PM COMPARISON: 06/12/2023 CLINICAL HISTORY: syncope FINDINGS: LUNGS AND PLEURA: No focal pulmonary opacity. No pleural effusion. No pneumothorax. HEART AND MEDIASTINUM: No acute abnormality of the cardiac and mediastinal silhouettes. BONES AND SOFT TISSUES: No acute osseous abnormality. IMPRESSION: 1. No acute process. Electronically signed by: Elsie Gravely MD 01/25/2024 08:24 PM EST RP Workstation: [id number]    ASSESSMENT & PLAN:  59 y.o. 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   Left upper breast mass with skin changes and left axillary lymphadenopathy.  Also noted to have diffuse sclerotic bone metastases.   #2 left-sided pyelonephritis with left-sided hydronephrosis and hydroureter.    #3 hx sepsis due to pyelonephritis.   #4 heavy smoker more than 75-pack-year history of cigarette smoking   #5 history of heavy alcohol use 6-10 beers daily.  PLAN: - Discussed lab results on 02/02/2024 in detail with patient: -Hemoglobin 13, platelets 430k  -CMP is stable -Creatinine levels from ED visit showed dehydration, today it has improved but shows minor dehydration still CA 15-3 and ca 27-29 tumor markers improving. Continue letrozole  and Kisquali at current doses. -Recommended meeting with PCP sooner than March 2026 to address Losartan  dosage, it is possible she is currently on a  dose that is too high and causing her syncopal episdoes -Recommends a syncope workup -Recommends clearance from a Dentist or Oral Surgeon to be cleared for bone strenthening medication -Discussed gradual weight gain  -RTC with Dr. Onesimo with labs in 3 months  FOLLOW-UP in 3 months for labs and follow-up with  Dr. Onesimo.  The total time spent in the appointment was 30 minutes* .  All of the patient's questions were answered and the patient knows to call the clinic with any problems, questions, or concerns.  Emaline Onesimo MD MS AAHIVMS [facility] Hematology/Oncology Physician [facility]  *Total Encounter Time as defined by the Centers for Medicare and Medicaid Services includes, in addition to the face-to-face time of a patient visit (documented in the note above) non-face-to-face time: obtaining and reviewing outside history, ordering and reviewing medications, tests or procedures, care coordination (communications with other health care professionals or caregivers) and documentation in the medical record.  I, Alan Blowers, acting as a neurosurgeon for Emaline Onesimo, MD.,have documented all relevant documentation on the behalf of Emaline Onesimo, MD,as directed by  Emaline Onesimo, MD while in the presence of Emaline Onesimo, MD.  I have reviewed the above documentation for accuracy and completeness, and I agree with the above.  Emaline Onesimo, MD     [1]  Social History Tobacco Use   Smoking status: Every Day    Current packs/day: 2.00    Average packs/day: 2.0 packs/day for 39.0 years (78.1 ttl pk-yrs)    Types: Cigarettes    Start date: 60   Smokeless tobacco: Never  Vaping Use   Vaping status: Never Used  Substance Use Topics   Alcohol use: Yes    Alcohol/week: 21.0 standard drinks of alcohol    Types: 21 Cans of beer per week    Comment: occ beer   Drug use: Not Currently    Frequency: 3.0 times per week    Types: Marijuana   "

## 2024-02-03 ENCOUNTER — Telehealth: Payer: Self-pay | Admitting: Hematology

## 2024-02-03 ENCOUNTER — Telehealth: Payer: Self-pay

## 2024-02-03 LAB — CANCER ANTIGEN 27.29: CA 27.29: 104 U/mL — ABNORMAL HIGH (ref 0.0–38.6)

## 2024-02-03 LAB — CANCER ANTIGEN 15-3: CA 15-3: 85.8 U/mL — ABNORMAL HIGH (ref 0.0–25.0)

## 2024-02-03 NOTE — Telephone Encounter (Signed)
 Called patient and she verbalized understanding. She will make a sooner appt if needed

## 2024-02-03 NOTE — Telephone Encounter (Signed)
 Scheduled patient for next appointment. Called and spoke with the patient's daughter, she is aware.

## 2024-02-03 NOTE — Telephone Encounter (Signed)
 See message below  Copied from CRM #[id number]. Topic: Clinical - Prescription Issue >> Feb 02, 2024  3:34 PM Deleta RAMAN wrote: Reason for CRM: patient daughter has been experiencing low blood pressure due to medication seen in er for that reason and has been experiencing dehydration losartan -hydrochlorothiazide (HYZAAR) 50-12.5 MG tablet. [phone] [name]

## 2024-02-03 NOTE — Telephone Encounter (Signed)
 I am sorry to hear that.  I recommend holding the blood pressure medication, do not use while experiencing the symptoms.

## 2024-02-08 ENCOUNTER — Other Ambulatory Visit: Payer: Self-pay

## 2024-02-08 ENCOUNTER — Other Ambulatory Visit: Payer: Self-pay | Admitting: Hematology

## 2024-02-08 ENCOUNTER — Other Ambulatory Visit (HOSPITAL_COMMUNITY): Payer: Self-pay

## 2024-02-08 MED ORDER — RIBOCICLIB SUCC (400 MG DOSE) 200 MG PO TBPK
400.0000 mg | ORAL_TABLET | Freq: Every day | ORAL | 1 refills | Status: AC
  Filled 2024-02-08: qty 42, 21d supply, fill #0
  Filled 2024-02-09: qty 42, 28d supply, fill #0

## 2024-02-09 ENCOUNTER — Other Ambulatory Visit: Payer: Self-pay

## 2024-02-09 ENCOUNTER — Other Ambulatory Visit (HOSPITAL_COMMUNITY): Payer: Self-pay

## 2024-02-09 NOTE — Progress Notes (Signed)
 Specialty Pharmacy Refill Coordination Note  Caysie Minnifield is a 59 y.o. female contacted today regarding refills of specialty medication(s) Ribociclib  Succinate (KISQALI  400mg  Daily Dose)   Patient requested Pickup at [facility] at [facility] date: 02/12/24   Medication will be filled on: 02/11/24  Spoke with patient's daughter

## 2024-02-11 ENCOUNTER — Other Ambulatory Visit: Payer: Self-pay

## 2024-02-23 ENCOUNTER — Other Ambulatory Visit (HOSPITAL_COMMUNITY): Payer: Self-pay

## 2024-04-25 ENCOUNTER — Ambulatory Visit: Payer: MEDICAID | Admitting: Student in an Organized Health Care Education/Training Program

## 2024-05-03 ENCOUNTER — Inpatient Hospital Stay: Payer: MEDICAID | Admitting: Hematology

## 2024-05-03 ENCOUNTER — Inpatient Hospital Stay: Payer: MEDICAID
